# Patient Record
Sex: Female | Born: 1995 | Race: Black or African American | Hispanic: No | Marital: Single | Smoking: Never smoker
Health system: Southern US, Community
[De-identification: ages and names within clinical notes are randomized; demographics above are authoritative.]

## PROBLEM LIST (undated history)

## (undated) DIAGNOSIS — F329 Major depressive disorder, single episode, unspecified: Secondary | ICD-10-CM

## (undated) DIAGNOSIS — M549 Dorsalgia, unspecified: Secondary | ICD-10-CM

## (undated) DIAGNOSIS — F32A Depression, unspecified: Secondary | ICD-10-CM

## (undated) DIAGNOSIS — E669 Obesity, unspecified: Secondary | ICD-10-CM

## (undated) DIAGNOSIS — L732 Hidradenitis suppurativa: Secondary | ICD-10-CM

## (undated) DIAGNOSIS — G8929 Other chronic pain: Secondary | ICD-10-CM

## (undated) HISTORY — DX: Obesity, unspecified: E66.9

## (undated) HISTORY — DX: Depression, unspecified: F32.A

---
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
## 2015-01-27 ENCOUNTER — Emergency Department (INDEPENDENT_AMBULATORY_CARE_PROVIDER_SITE_OTHER)
Admission: EM | Admit: 2015-01-27 | Discharge: 2015-01-27 | Disposition: A | Discharge status: Home / Self Care | Source: Home / Self Care | Attending: Family Medicine | Admitting: Family Medicine

## 2015-01-27 ENCOUNTER — Encounter (HOSPITAL_COMMUNITY): Payer: Self-pay | Admitting: Emergency Medicine

## 2015-01-27 DIAGNOSIS — M940 Chondrocostal junction syndrome [Tietze]: Secondary | ICD-10-CM | POA: Diagnosis not present

## 2015-01-27 MED ORDER — MELOXICAM 7.5 MG PO TABS: 7.5000 mg | ORAL_TABLET | Freq: Two times a day (BID) | ORAL | Status: DC

## 2015-01-27 NOTE — ED Provider Notes (Signed)
CSN: [id number]     Arrival date & time 01/27/15  1600 History   First MD Initiated Contact with Patient 01/27/15 1726     Chief Complaint  Patient presents with  . Chest Pain   (Consider location/radiation/quality/duration/timing/severity/associated sxs/prior Treatment) Patient is a 19 y.o. female presenting with chest pain. The history is provided by the patient.  Chest Pain Pain location:  L chest and R chest Pain quality: sharp   Pain radiates to:  Does not radiate Pain radiates to the back: no   Pain severity:  Mild Onset quality:  Gradual Duration:  2 days Progression:  Unchanged Chronicity:  New Relieved by:  None tried Worsened by:  Nothing tried Ineffective treatments:  None tried Associated symptoms: no cough, no fever, no palpitations and no shortness of breath     Past Medical History  Diagnosis Date  . Obesity    History reviewed. No pertinent past surgical history. Family History  Problem Relation Age of Onset  . Hypertension Mother 1  . Anxiety disorder Mother   . Hyperlipidemia Father 19  . Stroke Maternal Grandmother   . Anxiety disorder Sister    History  Substance Use Topics  . Smoking status: Never Smoker   . Smokeless tobacco: Never Used  . Alcohol Use: No   OB History    Gravida Para Term Preterm AB TAB SAB Ectopic Multiple Living   Review of Systems  Constitutional: Negative.  Negative for fever.  Respiratory: Negative.  Negative for cough and shortness of breath.   Cardiovascular: Positive for chest pain. Negative for palpitations and leg swelling.    Allergies  Review of patient's allergies indicates no known allergies.  Home Medications   Prior to Admission medications   Medication Sig Start Date End Date Taking? Authorizing Provider  albuterol (PROVENTIL HFA;VENTOLIN HFA) 108 (90 BASE) MCG/ACT inhaler Inhale 2 puffs into the lungs every 6 (six) hours as needed for wheezing or shortness of breath. 12/23/14   Rodolph Bong, MD  azithromycin (ZITHROMAX) 250 MG tablet Take 1 tablet (250 mg total) by mouth daily. Take first 2 tablets together, then 1 every day until finished. 12/23/14   Rodolph Bong, MD  clindamycin (CLEOCIN) 300 MG capsule Take 300 mg by mouth 3 (three) times daily. For 10 days. Started 08/16/13    Historical Provider, MD  guaiFENesin-codeine 100-10 MG/5ML syrup Take 5 mLs by mouth at bedtime as needed for cough. 12/21/14   Rodolph Bong, MD  ibuprofen (ADVIL,MOTRIN) 800 MG tablet Take 800 mg by mouth every 8 (eight) hours as needed.    Historical Provider, MD  ipratropium (ATROVENT) 0.06 % nasal spray Place 2 sprays into both nostrils 4 (four) times daily. 12/21/14   Rodolph Bong, MD  meloxicam (MOBIC) 7.5 MG tablet Take 1 tablet (7.5 mg total) by mouth 2 (two) times daily after a meal. 01/27/15   Linna Hoff, MD  naproxen (NAPROSYN) 375 MG tablet Take 1 tablet (375 mg total) by mouth 2 (two) times daily. 12/21/14   Rodolph Bong, MD  norethindrone-ethinyl estradiol (OVCON-35, 28,) 0.4-35 MG-MCG tablet Take 1 tablet by mouth daily. 10/05/13   Antionette Char, MD  predniSONE (DELTASONE) 10 MG tablet Take 3 tablets (30 mg total) by mouth daily. Patient not taking: Reported on 01/27/2015 12/23/14   Rodolph Bong, MD   BP 98/67 mmHg  Pulse 89  Temp(Src) 99.1 F (  37.3 C) (Oral)  Resp 16  SpO2 99%  LMP 12/28/2014 Physical Exam  Constitutional: She is oriented to person, place, and time. She appears well-developed and well-nourished. No distress.  Cardiovascular: Normal rate, regular rhythm, normal heart sounds and intact distal pulses.   Pulmonary/Chest: Effort normal and breath sounds normal. She exhibits tenderness.  Musculoskeletal: She exhibits no edema.  Neurological: She is alert and oriented to person, place, and time.  Skin: Skin is warm.  Nursing note and vitals reviewed.   ED Course  Procedures (including critical care time) Labs Review Labs Reviewed - No data to display  Imaging  Review No results found.   MDM   1. Costochondritis, acute        Linna HoffJames D Kindl, MD 01/27/15 1755

## 2015-01-27 NOTE — ED Notes (Signed)
C/o center chest pain with deep breathing.  Patient reports pain for 2-3 days.  Reports breathing is what seems to aggravate pain.

## 2015-03-23 ENCOUNTER — Encounter (HOSPITAL_COMMUNITY): Payer: Self-pay | Admitting: *Deleted

## 2015-03-23 ENCOUNTER — Emergency Department (HOSPITAL_COMMUNITY)
Admission: EM | Admit: 2015-03-23 | Discharge: 2015-03-23 | Disposition: A | Discharge status: Home / Self Care | Attending: Emergency Medicine | Admitting: Emergency Medicine

## 2015-03-23 DIAGNOSIS — R220 Localized swelling, mass and lump, head: Secondary | ICD-10-CM | POA: Diagnosis present

## 2015-03-23 DIAGNOSIS — E669 Obesity, unspecified: Secondary | ICD-10-CM | POA: Diagnosis not present

## 2015-03-23 DIAGNOSIS — J029 Acute pharyngitis, unspecified: Secondary | ICD-10-CM | POA: Insufficient documentation

## 2015-03-23 DIAGNOSIS — Z79899 Other long term (current) drug therapy: Secondary | ICD-10-CM | POA: Diagnosis not present

## 2015-03-23 MED ORDER — DEXAMETHASONE SODIUM PHOSPHATE 4 MG/ML IJ SOLN
10.0000 mg | Freq: Once | INTRAMUSCULAR | Status: AC
  Administered 2015-03-23: 10 mg via INTRAVENOUS
  Filled 2015-03-23: qty 3

## 2015-03-23 MED ORDER — HYDROCODONE-HOMATROPINE 5-1.5 MG/5ML PO SYRP
5.0000 mL | ORAL_SOLUTION | Freq: Once | ORAL | Status: AC
  Administered 2015-03-23: 5 mL via ORAL
  Filled 2015-03-23: qty 5

## 2015-03-23 MED ORDER — PREDNISONE 10 MG PO TABS: 20.0000 mg | ORAL_TABLET | Freq: Two times a day (BID) | ORAL | Status: DC

## 2015-03-23 MED ORDER — LORATADINE 10 MG PO TABS
10.0000 mg | ORAL_TABLET | Freq: Once | ORAL | Status: AC
  Administered 2015-03-23: 10 mg via ORAL
  Filled 2015-03-23: qty 1

## 2015-03-23 MED ORDER — NAPROXEN 500 MG PO TABS: 500.0000 mg | ORAL_TABLET | Freq: Two times a day (BID) | ORAL | Status: DC

## 2015-03-23 MED ORDER — LORATADINE 10 MG PO TABS: 10.0000 mg | ORAL_TABLET | Freq: Every day | ORAL | Status: DC

## 2015-03-23 NOTE — ED Provider Notes (Signed)
CSN: [id number]     Arrival date & time 03/23/15  0000 History   None    Chief Complaint  Patient presents with  . Oral Swelling     (Consider location/radiation/quality/duration/timing/severity/associated sxs/prior Treatment) The history is provided by the patient.   Kelsey Carroll is a 19 y.o. female who presents to the ED with oral swelling that started earlier today. She states that when she got up this am she felt like her cheeks were swollen on the inside of her mouth and her throat felt swollen. She denies difficulty swallowing, fever, chills or other problems. She has taken nothing for pain. She denies dental problems.   Past Medical History  Diagnosis Date  . Obesity    History reviewed. No pertinent past surgical history. Family History  Problem Relation Age of Onset  . Hypertension Mother 37  . Anxiety disorder Mother   . Hyperlipidemia Father 56  . Stroke Maternal Grandmother   . Anxiety disorder Sister    Social History  Substance Use Topics  . Smoking status: Never Smoker   . Smokeless tobacco: Never Used  . Alcohol Use: No   OB History    Gravida Para Term Preterm AB TAB SAB Ectopic Multiple Living   1    1 1          Review of Systems Negative except as stated in HPI   Allergies  Review of patient's allergies indicates no known allergies.  Home Medications   Prior to Admission medications   Medication Sig Start Date End Date Taking? Authorizing Provider  albuterol (PROVENTIL HFA;VENTOLIN HFA) 108 (90 BASE) MCG/ACT inhaler Inhale 2 puffs into the lungs every 6 (six) hours as needed for wheezing or shortness of breath. 12/23/14   Rodolph Bong, MD  ipratropium (ATROVENT) 0.06 % nasal spray Place 2 sprays into both nostrils 4 (four) times daily. 12/21/14   Rodolph Bong, MD  loratadine (CLARITIN) 10 MG tablet Take 1 tablet (10 mg total) by mouth daily. 03/23/15   Lenda Baratta Orlene Och, NP  naproxen (NAPROSYN) 500 MG tablet Take 1 tablet (500 mg total) by mouth 2  (two) times daily. 03/23/15   Mikaila Grunert Orlene Och, NP  norethindrone-ethinyl estradiol (OVCON-35, 28,) 0.4-35 MG-MCG tablet Take 1 tablet by mouth daily. 10/05/13   Antionette Char, MD   BP 105/66 mmHg  Pulse 93  Temp(Src) 98.3 F (36.8 C) (Oral)  Resp 22  Ht 5\' 6"  (1.676 m)  SpO2 98%  LMP 03/19/2015 Physical Exam  Constitutional: She is oriented to person, place, and time. She appears well-developed and well-nourished.  HENT:  Head: Normocephalic and atraumatic.  Right Ear: Tympanic membrane normal.  Left Ear: Tympanic membrane normal.  Nose: Nose normal.  Mouth/Throat: Uvula is midline, oropharynx is clear and moist and mucous membranes are normal. No oral lesions. No trismus in the jaw. Normal dentition. No dental abscesses, uvula swelling or dental caries. No oropharyngeal exudate, posterior oropharyngeal edema, posterior oropharyngeal erythema or tonsillar abscesses.  Eyes: Conjunctivae and EOM are normal.  Neck: Normal range of motion. Neck supple. No tracheal deviation present.  Cardiovascular: Normal rate and regular rhythm.   Pulmonary/Chest: Effort normal and breath sounds normal.  Musculoskeletal: Normal range of motion.  Lymphadenopathy:    She has no cervical adenopathy.  Neurological: She is alert and oriented to person, place, and time. No cranial nerve deficit.  Skin: Skin is warm and dry.  Psychiatric: She has a normal mood and affect. Her behavior is normal.  Nursing note and vitals reviewed.   ED Course  Procedures (including critical care time)  Dr. Judd Lien in to examine the patient and does not feel that imaging is indicated. Will treat for pain and offer patient PO fluids.  Decadron 10 mg IM, Claritin 10 mg PO, Hycodan 5 ccs po   MDM  19 y.o. female with feeling of oral swelling x 1 day. Stable for d/c eating and drinking without difficulty and states that pain is almost gone. Discussed with the patient plan of care and all questioned fully answered. She will  return if any problems arise. Rx for prednisone, Claritin and Naprosyn.  Final diagnoses:  Sore throat       Janne Napoleon, NP 03/23/15 1610  Geoffery Lyons, MD 03/23/15 [phone]

## 2015-03-23 NOTE — ED Notes (Signed)
The pt has had mouth swelling just today and it makes her throat hurt.noi breathing difficulty  lmp  Just off

## 2015-03-23 NOTE — Discharge Instructions (Signed)
You may take Benadryl in addition to the other medications if needed. Return for worsening symptoms.

## 2015-05-21 ENCOUNTER — Encounter (HOSPITAL_COMMUNITY): Payer: Self-pay | Admitting: Family Medicine

## 2015-05-21 ENCOUNTER — Emergency Department (HOSPITAL_COMMUNITY)
Admission: EM | Admit: 2015-05-21 | Discharge: 2015-05-21 | Disposition: A | Discharge status: Home / Self Care | Attending: Physician Assistant | Admitting: Physician Assistant

## 2015-05-21 DIAGNOSIS — E669 Obesity, unspecified: Secondary | ICD-10-CM | POA: Diagnosis not present

## 2015-05-21 DIAGNOSIS — Z793 Long term (current) use of hormonal contraceptives: Secondary | ICD-10-CM | POA: Insufficient documentation

## 2015-05-21 DIAGNOSIS — N762 Acute vulvitis: Secondary | ICD-10-CM | POA: Insufficient documentation

## 2015-05-21 DIAGNOSIS — N764 Abscess of vulva: Secondary | ICD-10-CM | POA: Diagnosis present

## 2015-05-21 DIAGNOSIS — Z3202 Encounter for pregnancy test, result negative: Secondary | ICD-10-CM | POA: Diagnosis not present

## 2015-05-21 DIAGNOSIS — Z79899 Other long term (current) drug therapy: Secondary | ICD-10-CM | POA: Diagnosis not present

## 2015-05-21 DIAGNOSIS — L0390 Cellulitis, unspecified: Secondary | ICD-10-CM

## 2015-05-21 DIAGNOSIS — Z791 Long term (current) use of non-steroidal anti-inflammatories (NSAID): Secondary | ICD-10-CM | POA: Diagnosis not present

## 2015-05-21 DIAGNOSIS — A5901 Trichomonal vulvovaginitis: Secondary | ICD-10-CM | POA: Insufficient documentation

## 2015-05-21 DIAGNOSIS — Z7952 Long term (current) use of systemic steroids: Secondary | ICD-10-CM | POA: Diagnosis not present

## 2015-05-21 DIAGNOSIS — A599 Trichomoniasis, unspecified: Secondary | ICD-10-CM

## 2015-05-21 LAB — WET PREP, GENITAL: Yeast Wet Prep HPF POC: NONE SEEN

## 2015-05-21 LAB — POC URINE PREG, ED: PREG TEST UR: NEGATIVE

## 2015-05-21 MED ORDER — CEPHALEXIN 500 MG PO CAPS: 500.0000 mg | ORAL_CAPSULE | Freq: Four times a day (QID) | ORAL | Status: DC

## 2015-05-21 MED ORDER — ACETAMINOPHEN 500 MG PO TABS: 500.0000 mg | ORAL_TABLET | Freq: Four times a day (QID) | ORAL | Status: DC | PRN

## 2015-05-21 MED ORDER — CEFTRIAXONE SODIUM 250 MG IJ SOLR
250.0000 mg | Freq: Once | INTRAMUSCULAR | Status: AC
  Administered 2015-05-21: 250 mg via INTRAMUSCULAR
  Filled 2015-05-21: qty 250

## 2015-05-21 MED ORDER — METRONIDAZOLE 500 MG PO TABS
2000.0000 mg | ORAL_TABLET | Freq: Once | ORAL | Status: AC
  Administered 2015-05-21: 2000 mg via ORAL
  Filled 2015-05-21: qty 4

## 2015-05-21 MED ORDER — LIDOCAINE HCL (PF) 1 % IJ SOLN: 5.0000 mL | Freq: Once | INTRAMUSCULAR | Status: DC

## 2015-05-21 MED ORDER — AZITHROMYCIN 250 MG PO TABS
1000.0000 mg | ORAL_TABLET | Freq: Once | ORAL | Status: AC
  Administered 2015-05-21: 1000 mg via ORAL
  Filled 2015-05-21: qty 4

## 2015-05-21 MED ORDER — ACETAMINOPHEN 325 MG PO TABS
650.0000 mg | ORAL_TABLET | Freq: Once | ORAL | Status: AC
  Administered 2015-05-21: 650 mg via ORAL
  Filled 2015-05-21: qty 2

## 2015-05-21 NOTE — Discharge Instructions (Signed)
Cellulitis Cellulitis is an infection of the skin and the tissue beneath it. The infected area is usually red and tender. Cellulitis occurs most often in the arms and lower legs.  CAUSES  Cellulitis is caused by bacteria that enter the skin through cracks or cuts in the skin. The most common types of bacteria that cause cellulitis are staphylococci and streptococci. SIGNS AND SYMPTOMS   Redness and warmth.  Swelling.  Tenderness or pain.  Fever. DIAGNOSIS  Your health care provider can usually determine what is wrong based on a physical exam. Blood tests may also be done. TREATMENT  Treatment usually involves taking an antibiotic medicine. HOME CARE INSTRUCTIONS   Take your antibiotic medicine as directed by your health care provider. Finish the antibiotic even if you start to feel better.  Keep the infected arm or leg elevated to reduce swelling.  Apply a warm cloth to the affected area up to 4 times per day to relieve pain.  Take medicines only as directed by your health care provider.  Keep all follow-up visits as directed by your health care provider. SEEK MEDICAL CARE IF:   You notice red streaks coming from the infected area.  Your red area gets larger or turns dark in color.  Your bone or joint underneath the infected area becomes painful after the skin has healed.  Your infection returns in the same area or another area.  You notice a swollen bump in the infected area.  You develop new symptoms.  You have a fever. SEEK IMMEDIATE MEDICAL CARE IF:   You feel very sleepy.  You develop vomiting or diarrhea.  You have a general ill feeling (malaise) with muscle aches and pains.   This information is not intended to replace advice given to you by your health care provider. Make sure you discuss any questions you have with your health care provider.   Document Released: 04/07/2005 Document Revised: 03/19/2015 Document Reviewed: 09/13/2011 Elsevier Interactive  Patient Education 2016 [employer].  Sexually Transmitted Disease A sexually transmitted disease (STD) is a disease or infection that may be passed (transmitted) from person to person, usually during sexual activity. This may happen by way of saliva, semen, blood, vaginal mucus, or urine. Common STDs include:  Gonorrhea.  Chlamydia.  Syphilis.  HIV and AIDS.  Genital herpes.  Hepatitis B and C.  Trichomonas.  Human papillomavirus (HPV).  Pubic lice.  Scabies.  Mites.  Bacterial vaginosis. WHAT ARE CAUSES OF STDs? An STD may be caused by bacteria, a virus, or parasites. STDs are often transmitted during sexual activity if one person is infected. However, they may also be transmitted through nonsexual means. STDs may be transmitted after:   Sexual intercourse with an infected person.  Sharing sex toys with an infected person.  Sharing needles with an infected person or using unclean piercing or tattoo needles.  Having intimate contact with the genitals, mouth, or rectal areas of an infected person.  Exposure to infected fluids during birth. WHAT ARE THE SIGNS AND SYMPTOMS OF STDs? Different STDs have different symptoms. Some people may not have any symptoms. If symptoms are present, they may include:  Painful or bloody urination.  Pain in the pelvis, abdomen, vagina, anus, throat, or eyes.  A skin rash, itching, or irritation.  Growths, ulcerations, blisters, or sores in the genital and anal areas.  Abnormal vaginal discharge with or without bad odor.  Penile discharge in men.  Fever.  Pain or bleeding during sexual intercourse.  Swollen  glands in the groin area.  Yellow skin and eyes (jaundice). This is seen with hepatitis.  Swollen testicles.  Infertility.  Sores and blisters in the mouth. HOW ARE STDs DIAGNOSED? To make a diagnosis, your health care provider may:  Take a medical history.  Perform a physical exam.  Take a sample of any  discharge to examine.  Swab the throat, cervix, opening to the penis, rectum, or vagina for testing.  Test a sample of your first morning urine.  Perform blood tests.  Perform a Pap test, if this applies.  Perform a colposcopy.  Perform a laparoscopy. HOW ARE STDs TREATED? Treatment depends on the STD. Some STDs may be treated but not cured.  Chlamydia, gonorrhea, trichomonas, and syphilis can be cured with antibiotic medicine.  Genital herpes, hepatitis, and HIV can be treated, but not cured, with prescribed medicines. The medicines lessen symptoms.  Genital warts from HPV can be treated with medicine or by freezing, burning (electrocautery), or surgery. Warts may come back.  HPV cannot be cured with medicine or surgery. However, abnormal areas may be removed from the cervix, vagina, or vulva.  If your diagnosis is confirmed, your recent sexual partners need treatment. This is true even if they are symptom-free or have a negative culture or evaluation. They should not have sex until their health care providers say it is okay.  Your health care provider may test you for infection again 3 months after treatment. HOW CAN I REDUCE MY RISK OF GETTING AN STD? Take these steps to reduce your risk of getting an STD:  Use latex condoms, dental dams, and water-soluble lubricants during sexual activity. Do not use petroleum jelly or oils.  Avoid having multiple sex partners.  Do not have sex with someone who has other sex partners  Do not have sex with anyone you do not know or who is at high risk for an STD.  Avoid risky sex practices that can break your skin.  Do not have sex if you have open sores on your mouth or skin.  Avoid drinking too much alcohol or taking illegal drugs. Alcohol and drugs can affect your judgment and put you in a vulnerable position.  Avoid engaging in oral and anal sex acts.  Get vaccinated for HPV and hepatitis. If you have not received these vaccines  in the past, talk to your health care provider about whether one or both might be right for you.  If you are at risk of being infected with HIV, it is recommended that you take a prescription medicine daily to prevent HIV infection. This is called pre-exposure prophylaxis (PrEP). You are considered at risk if:  You are a man who has sex with other men (MSM).  You are a heterosexual man or woman and are sexually active with more than one partner.  You take drugs by injection.  You are sexually active with a partner who has HIV.  Talk with your health care provider about whether you are at high risk of being infected with HIV. If you choose to begin PrEP, you should first be tested for HIV. You should then be tested every 3 months for as long as you are taking PrEP. WHAT SHOULD I DO IF I THINK I HAVE AN STD?  See your health care provider.  Tell your sexual partner(s). They should be tested and treated for any STDs.  Do not have sex until your health care provider says it is okay. WHEN SHOULD I GET  IMMEDIATE MEDICAL CARE? Contact your health care provider right away if:   You have severe abdominal pain.  You are a man and notice swelling or pain in your testicles.  You are a woman and notice swelling or pain in your vagina.   This information is not intended to replace advice given to you by your health care provider. Make sure you discuss any questions you have with your health care provider.   Document Released: 09/18/2002 Document Revised: 07/19/2014 Document Reviewed: 01/16/2013 Elsevier Interactive Patient Education 2016 [employer]  Trichomoniasis Trichomoniasis is an infection caused by an organism called Trichomonas. The infection can affect both women and men. In women, the outer female genitalia and the vagina are affected. In men, the penis is mainly affected, but the prostate and other reproductive organs can also be involved. Trichomoniasis is a sexually transmitted  infection (STI) and is most often passed to another person through sexual contact.  RISK FACTORS  Having unprotected sexual intercourse.  Having sexual intercourse with an infected partner. SIGNS AND SYMPTOMS  Symptoms of trichomoniasis in women include:  Abnormal gray-green frothy vaginal discharge.  Itching and irritation of the vagina.  Itching and irritation of the area outside the vagina. Symptoms of trichomoniasis in men include:   Penile discharge with or without pain.  Pain during urination. This results from inflammation of the urethra. DIAGNOSIS  Trichomoniasis may be found during a Pap test or physical exam. Your health care provider may use one of the following methods to help diagnose this infection:  Testing the pH of the vagina with a test tape.  Using a vaginal swab test that checks for the Trichomonas organism. A test is available that provides results within a few minutes.  Examining a urine sample.  Testing vaginal secretions. Your health care provider may test you for other STIs, including HIV. TREATMENT   You may be given medicine to fight the infection. Women should inform their health care provider if they could be or are pregnant. Some medicines used to treat the infection should not be taken during pregnancy.  Your health care provider may recommend over-the-counter medicines or creams to decrease itching or irritation.  Your sexual partner will need to be treated if infected.  Your health care provider may test you for infection again 3 months after treatment. HOME CARE INSTRUCTIONS   Take medicines only as directed by your health care provider.  Take over-the-counter medicine for itching or irritation as directed by your health care provider.  Do not have sexual intercourse while you have the infection.  Women should not douche or wear tampons while they have the infection.  Discuss your infection with your partner. Your partner may have  gotten the infection from you, or you may have gotten it from your partner.  Have your sex partner get examined and treated if necessary.  Practice safe, informed, and protected sex.  See your health care provider for other STI testing. SEEK MEDICAL CARE IF:   You still have symptoms after you finish your medicine.  You develop abdominal pain.  You have pain when you urinate.  You have bleeding after sexual intercourse.  You develop a rash.  Your medicine makes you sick or makes you throw up (vomit). MAKE SURE YOU:  Understand these instructions.  Will watch your condition.  Will get help right away if you are not doing well or get worse.   This information is not intended to replace advice given to you by  your health care provider. Make sure you discuss any questions you have with your health care provider.   Document Released: 12/22/2000 Document Revised: 07/19/2014 Document Reviewed: 04/09/2013 Elsevier Interactive Patient Education 2016 [employer].  Safe Sex  Safe sex is about reducing the risk of giving or getting a sexually transmitted disease (STD). STDs are spread through sexual contact involving the genitals, mouth, or rectum. Some STDs can be cured and others cannot. Safe sex can also prevent unintended pregnancies.  WHAT ARE SOME SAFE SEX PRACTICES?  Limit your sexual activity to only one partner who is having sex with only you.  Talk to your partner about his or her past partners, past STDs, and drug use.  Use a condom every time you have sexual intercourse. This includes vaginal, oral, and anal sexual activity. Both females and males should wear condoms during oral sex. Only use latex or polyurethane condoms and water-based lubricants. Using petroleum-based lubricants or oils to lubricate a condom will weaken the condom and increase the chance that it will break. The condom should be in place from the beginning to the end of sexual activity. Wearing a condom  reduces, but does not completely eliminate, your risk of getting or giving an STD. STDs can be spread by contact with infected body fluids and skin.  Get vaccinated for hepatitis B and HPV.  Avoid alcohol and recreational drugs, which can affect your judgment. You may forget to use a condom or participate in high-risk sex.  For females, avoid douching after sexual intercourse. Douching can spread an infection farther into the reproductive tract.  Check your body for signs of sores, blisters, rashes, or unusual discharge. See your health care provider if you notice any of these signs.  Avoid sexual contact if you have symptoms of an infection or are being treated for an STD. If you or your partner has herpes, avoid sexual contact when blisters are present. Use condoms at all other times.  If you are at risk of being infected with HIV, it is recommended that you take a prescription medicine daily to prevent HIV infection. This is called pre-exposure prophylaxis (PrEP). You are considered at risk if:  You are a man who has sex with other men (MSM).  You are a heterosexual man or woman who is sexually active with more than one partner.  You take drugs by injection.  You are sexually active with a partner who has HIV. Talk with your health care provider about whether you are at high risk of being infected with HIV. If you choose to begin PrEP, you should first be tested for HIV. You should then be tested every 3 months for as long as you are taking PrEP.  See your health care provider for regular screenings, exams, and tests for other STDs. Before having sex with a new partner, each of you should be screened for STDs and should talk about the results with each other. WHAT ARE THE BENEFITS OF SAFE SEX?  There is less chance of getting or giving an STD.  You can prevent unwanted or unintended pregnancies.  By discussing safe sex concerns with your partner, you may increase feelings of intimacy, comfort,  trust, and honesty between the two of you. This information is not intended to replace advice given to you by your health care provider. Make sure you discuss any questions you have with your health care provider.  Document Released: 08/05/2004 Document Revised: 07/19/2014 Document Reviewed: 12/20/2011  Elsevier Interactive Patient Education  2016 [employer].

## 2015-05-21 NOTE — ED Provider Notes (Signed)
CSN: [id number]     Arrival date & time 05/21/15  1139 History  By signing my name below, I, Kelsey Carroll, attest that this documentation has been prepared under the direction and in the presence of [name], PA-C. Electronically Signed: Tanda Carroll, ED Scribe. 05/21/2015. 12:57 PM.  Chief Complaint  Patient presents with  . Abscess   The history is provided by the patient. No language interpreter was used.     HPI Comments: Kelsey Carroll is a 19 y.o. female who presents to the Emergency Department complaining of abscess to right labia x 2-3 days, increasing in size 1 day ago. Pt states that she recently shaved around the area and believes the abscess formed due to shaving. She reports gradual onset, constant, 5/10, pain to the area since onset. Pt soaked in epsom salts earlier today with mild relief. She notes that while in the waiting room today it began draining a foul smelling, bloody and white discharge. She also complains of vaginal discharge. Denies fever, chills, nausea, vomiting, abdominal pain, dysuria, or any other associated symptoms. Pt is requested a pregnancy test today as well.   Past Medical History  Diagnosis Date  . Obesity    History reviewed. No pertinent past surgical history. Family History  Problem Relation Age of Onset  . Hypertension Mother 24  . Anxiety disorder Mother   . Hyperlipidemia Father 72  . Stroke Maternal Grandmother   . Anxiety disorder Sister    Social History  Substance Use Topics  . Smoking status: Never Smoker   . Smokeless tobacco: Never Used  . Alcohol Use: No   OB History    Gravida Para Term Preterm AB TAB SAB Ectopic Multiple Living   Review of Systems  All other systems reviewed and are negative.   Allergies  Review of patient's allergies indicates no known allergies.  Home Medications   Prior to Admission medications   Medication Sig Start Date End Date Taking? Authorizing Provider   acetaminophen (TYLENOL) 500 MG tablet Take 1 tablet (500 mg total) by mouth every 6 (six) hours as needed. 05/21/15   [name], PA-C  albuterol (PROVENTIL HFA;VENTOLIN HFA) 108 (90 BASE) MCG/ACT inhaler Inhale 2 puffs into the lungs every 6 (six) hours as needed for wheezing or shortness of breath. 12/23/14   Rodolph Bong, MD  cephALEXin (KEFLEX) 500 MG capsule Take 1 capsule (500 mg total) by mouth 4 (four) times daily. 05/21/15   Keevon Henney, PA-C  ipratropium (ATROVENT) 0.06 % nasal spray Place 2 sprays into both nostrils 4 (four) times daily. 12/21/14   Rodolph Bong, MD  loratadine (CLARITIN) 10 MG tablet Take 1 tablet (10 mg total) by mouth daily. 03/23/15   Hope Orlene Och, NP  naproxen (NAPROSYN) 500 MG tablet Take 1 tablet (500 mg total) by mouth 2 (two) times daily. 03/23/15   Hope Orlene Och, NP  norethindrone-ethinyl estradiol (OVCON-35, 28,) 0.4-35 MG-MCG tablet Take 1 tablet by mouth daily. 10/05/13   Antionette Char, MD  predniSONE (DELTASONE) 10 MG tablet Take 2 tablets (20 mg total) by mouth 2 (two) times daily with a meal. 03/23/15   Hope Orlene Och, NP   Triage Vitals: BP 107/64 mmHg  Pulse 75  Temp(Src) 98.1 F (36.7 C) (Oral)  Resp 18  SpO2 97%  LMP 05/19/2015   Physical Exam  Constitutional: She is oriented to person, place, and time. She  appears well-developed and well-nourished. No distress.  HENT:  Head: Normocephalic and atraumatic.  Eyes: Conjunctivae and EOM are normal.  Neck: Neck supple. No tracheal deviation present.  Cardiovascular: Normal rate, regular rhythm and normal heart sounds.   Pulmonary/Chest: Effort normal and breath sounds normal. No respiratory distress.  Abdominal: Soft. Bowel sounds are normal. She exhibits no distension. There is no tenderness.  Genitourinary: Uterus normal.    There is no tenderness on the right labia. There is no tenderness on the left labia. Cervix exhibits discharge. Cervix exhibits no motion tenderness. Right adnexum displays no  tenderness. Left adnexum displays no tenderness. Vaginal discharge found.  Musculoskeletal: Normal range of motion.  Neurological: She is alert and oriented to person, place, and time.  Skin: Skin is warm and dry.  Psychiatric: She has a normal mood and affect. Her behavior is normal.  Nursing note and vitals reviewed.   ED Course  Procedures (including critical care time)  DIAGNOSTIC STUDIES: Oxygen Saturation is 97% on RA, normal by my interpretation.    COORDINATION OF CARE: 12:55 PM-Discussed treatment plan which includes I&D, pelvic exam, and urine pregnancy with pt at bedside and pt agreed to plan.   Labs Review Labs Reviewed  WET PREP, GENITAL - Abnormal; Notable for the following:    Trich, Wet Prep FEW (*)    Clue Cells Wet Prep HPF POC MODERATE (*)    WBC, Wet Prep HPF POC FEW (*)    All other components within normal limits  POC URINE PREG, ED  GC/CHLAMYDIA PROBE AMP [facility]    Imaging Review No results found. I have personally reviewed and evaluated these lab results as part of my medical decision-making.   EKG Interpretation None      MDM   Final diagnoses:  Trichomoniasis  Cellulitis, unspecified cellulitis site, unspecified extremity site, unspecified laterality    Patient presents with abscess and abnormal vaginal discharge.  Patient has hx of abscess.  On exam, heart RRR, lungs CTAB, abdomen soft and benign.  Evidence area  lateral to right labia with erythema and induration.  Patient reports it drained upon arrival to ED.  I do not appreciate a well defined area of fluctuance that would indicate drainage.  I suspect spontaneous drainage.  Discussed risks and benefits of I&D.  Patient refused procedure.  Will give keflex and warm compresses to the area.  Discussed return precautions and will d/c home with keflex.  Wet prep shows evidence of trich.  Will treat here with metronidazole.  Will also empirically treat for  GC/chlamydia.  Evaluation does not show pathology requring ongoing emergent intervention or admission. Pt is hemodynamically stable and mentating appropriately. Discussed findings/results and plan with patient/guardian, who agrees with plan. All questions answered. Return precautions discussed and outpatient follow up given.   I personally performed the services described in this documentation, which was scribed in my presence. The recorded information has been reviewed and is accurate.      Cheri FowlerKayla Verner Kopischke, PA-C 05/21/15 1444  Courteney Lyn Mackuen, MD 05/21/15 1622

## 2015-05-21 NOTE — ED Notes (Signed)
Pt has hx of multiple abscesses in past. Today has lesion on right side of right labia. Reportedly, it drained while sitting in lobby, foul smelling discharge.

## 2015-05-21 NOTE — ED Notes (Signed)
Suture cart placed on outside of room

## 2015-05-21 NOTE — ED Notes (Signed)
Patient getting undressed and into a gown at this time; visitor at bedside 

## 2015-05-21 NOTE — ED Notes (Signed)
Pt here for abscess to inner thigh area. sts just started draining in waiting room. Foul smell. sts pain decreased since open.

## 2015-05-21 NOTE — ED Notes (Signed)
Blanket given; pelvic cart setup at bedside along with suture cart; visitor at bedside; [place] aware

## 2015-05-22 LAB — GC/CHLAMYDIA PROBE AMP (~~LOC~~) NOT AT ARMC
CHLAMYDIA, DNA PROBE: POSITIVE — AB
Neisseria Gonorrhea: NEGATIVE

## 2015-05-23 ENCOUNTER — Telehealth (HOSPITAL_BASED_OUTPATIENT_CLINIC_OR_DEPARTMENT_OTHER): Payer: Self-pay | Admitting: Emergency Medicine

## 2015-06-09 ENCOUNTER — Emergency Department (INDEPENDENT_AMBULATORY_CARE_PROVIDER_SITE_OTHER)
Admission: EM | Admit: 2015-06-09 | Discharge: 2015-06-09 | Disposition: A | Discharge status: Home / Self Care | Source: Home / Self Care | Attending: Family Medicine | Admitting: Family Medicine

## 2015-06-09 ENCOUNTER — Encounter (HOSPITAL_COMMUNITY): Payer: Self-pay | Admitting: Emergency Medicine

## 2015-06-09 DIAGNOSIS — J029 Acute pharyngitis, unspecified: Secondary | ICD-10-CM

## 2015-06-09 DIAGNOSIS — J069 Acute upper respiratory infection, unspecified: Secondary | ICD-10-CM

## 2015-06-09 DIAGNOSIS — J3489 Other specified disorders of nose and nasal sinuses: Secondary | ICD-10-CM | POA: Diagnosis not present

## 2015-06-09 LAB — POCT RAPID STREP A: Streptococcus, Group A Screen (Direct): NEGATIVE

## 2015-06-09 NOTE — ED Notes (Signed)
Also reports she's on Keflex for an abscess

## 2015-06-09 NOTE — ED Provider Notes (Signed)
CSN: [id number]     Arrival date & time 06/09/15  1738 History   First MD Initiated Contact with Patient 06/09/15 1855     Chief Complaint  Patient presents with  . URI   (Consider location/radiation/quality/duration/timing/severity/associated sxs/prior Treatment) HPI Comments: 19 year old female complaining of a sore throat starting yesterday and body aches started today. Denies cough, earache or PND.   Past Medical History  Diagnosis Date  . Obesity    History reviewed. No pertinent past surgical history. Family History  Problem Relation Age of Onset  . Hypertension Mother 6948  . Anxiety disorder Mother   . Hyperlipidemia Father [id number]  . Stroke Maternal Grandmother   . Anxiety disorder Sister    Social History  Substance Use Topics  . Smoking status: Never Smoker   . Smokeless tobacco: Never Used  . Alcohol Use: No   OB History    Gravida Para Term Preterm AB TAB SAB Ectopic Multiple Living   1    1 1          Review of Systems  Constitutional: Positive for activity change. Negative for fever, chills, appetite change and fatigue.  HENT: Positive for congestion, rhinorrhea, sore throat and voice change. Negative for facial swelling, postnasal drip and trouble swallowing.   Eyes: Negative.   Respiratory: Negative.  Negative for cough and shortness of breath.   Cardiovascular: Negative.   Gastrointestinal: Negative.   Genitourinary: Negative.   Musculoskeletal: Positive for myalgias. Negative for neck pain and neck stiffness.  Skin: Negative for pallor and rash.  Neurological: Negative.     Allergies  Review of patient's allergies indicates no known allergies.  Home Medications   Prior to Admission medications   Medication Sig Start Date End Date Taking? Authorizing Provider  cephALEXin (KEFLEX) 500 MG capsule Take 1 capsule (500 mg total) by mouth 4 (four) times daily. 05/21/15  Yes Cheri FowlerKayla Rose, PA-C  acetaminophen (TYLENOL) 500 MG tablet Take 1 tablet (500 mg total)  by mouth every 6 (six) hours as needed. 05/21/15   Cheri FowlerKayla Rose, PA-C  albuterol (PROVENTIL HFA;VENTOLIN HFA) 108 (90 BASE) MCG/ACT inhaler Inhale 2 puffs into the lungs every 6 (six) hours as needed for wheezing or shortness of breath. 12/23/14   Rodolph BongEvan S Corey, MD  ipratropium (ATROVENT) 0.06 % nasal spray Place 2 sprays into both nostrils 4 (four) times daily. 12/21/14   Rodolph BongEvan S Corey, MD  loratadine (CLARITIN) 10 MG tablet Take 1 tablet (10 mg total) by mouth daily. 03/23/15   Hope Orlene OchM Neese, NP  naproxen (NAPROSYN) 500 MG tablet Take 1 tablet (500 mg total) by mouth 2 (two) times daily. 03/23/15   Hope Orlene OchM Neese, NP  norethindrone-ethinyl estradiol (OVCON-35, 28,) 0.4-35 MG-MCG tablet Take 1 tablet by mouth daily. 10/05/13   Antionette CharLisa Jackson-Moore, MD  predniSONE (DELTASONE) 10 MG tablet Take 2 tablets (20 mg total) by mouth 2 (two) times daily with a meal. 03/23/15   Hope Orlene OchM Neese, NP   Meds Ordered and Administered this Visit  Medications - No data to display  BP 97/66 mmHg  Pulse 70  Temp(Src) 98.5 F (36.9 C) (Oral)  Resp 16  SpO2 99%  LMP 05/19/2015 No data found.   Physical Exam  Constitutional: She is oriented to person, place, and time. She appears well-developed and well-nourished. No distress.  HENT:  Mouth/Throat: No oropharyngeal exudate.  Bilateral TMs are normal Oropharynx with minor erythema, much cobblestoning and clear PND  Eyes: Conjunctivae and EOM are normal.  Neck: Normal  range of motion. Neck supple.  Cardiovascular: Normal rate, regular rhythm and normal heart sounds.   Pulmonary/Chest: Effort normal and breath sounds normal. No respiratory distress. She has no wheezes. She has no rales.  Musculoskeletal: Normal range of motion. She exhibits no edema.  Lymphadenopathy:    She has no cervical adenopathy.  Neurological: She is alert and oriented to person, place, and time.  Skin: Skin is warm and dry. No rash noted.  Psychiatric: She has a normal mood and affect.  Nursing  note and vitals reviewed.   ED Course  Procedures (including critical care time)  Labs Review Labs Reviewed  POCT RAPID STREP A   Results for orders placed or performed during the hospital encounter of 06/09/15  POCT rapid strep A [facility])  Result Value Ref Range   Streptococcus, Group A Screen (Direct) NEGATIVE NEGATIVE     Imaging Review No results found.   Visual Acuity Review  Right Eye Distance:   Left Eye Distance:   Bilateral Distance:    Right Eye Near:   Left Eye Near:    Bilateral Near:         MDM   1. URI (upper respiratory infection)   2. Pharyngitis   3. Sinus drainage    Sore Throat Motrin for pain Cepacol lozenges for throat pain, and aching Lots of fluids    Hayden Rasmussen, NP 06/09/15 1907

## 2015-06-09 NOTE — ED Notes (Signed)
C/o ST onset yest associated w/BA Denies fevers, chills A&O x4... No acute distress Blanket given to pt.

## 2015-06-09 NOTE — Discharge Instructions (Signed)
Sore Throat Motrin for pain Cepacol lozenges for throat pain, and aching A sore throat is pain, burning, irritation, or scratchiness of the throat. There is often pain or tenderness when swallowing or talking. A sore throat may be accompanied by other symptoms, such as coughing, sneezing, fever, and swollen neck glands. A sore throat is often the first sign of another sickness, such as a cold, flu, strep throat, or mononucleosis (commonly known as mono). Most sore throats go away without medical treatment. CAUSES  The most common causes of a sore throat include:  A viral infection, such as a cold, flu, or mono.  A bacterial infection, such as strep throat, tonsillitis, or whooping cough.  Seasonal allergies.  Dryness in the air.  Irritants, such as smoke or pollution.  Gastroesophageal reflux disease (GERD). HOME CARE INSTRUCTIONS   Only take over-the-counter medicines as directed by your caregiver.  Drink enough fluids to keep your urine clear or pale yellow.  Rest as needed.  Try using throat sprays, lozenges, or sucking on hard candy to ease any pain (if older than 4 years or as directed).  Sip warm liquids, such as broth, herbal tea, or warm water with honey to relieve pain temporarily. You may also eat or drink cold or frozen liquids such as frozen ice pops.  Gargle with salt water (mix 1 tsp salt with 8 oz of water).  Do not smoke and avoid secondhand smoke.  Put a cool-mist humidifier in your bedroom at night to moisten the air. You can also turn on a hot shower and sit in the bathroom with the door closed for 5-10 minutes. SEEK IMMEDIATE MEDICAL CARE IF:  You have difficulty breathing.  You are unable to swallow fluids, soft foods, or your saliva.  You have increased swelling in the throat.  Your sore throat does not get better in 7 days.  You have nausea and vomiting.  You have a fever or persistent symptoms for more than 2-3 days.  You have a fever and your  symptoms suddenly get worse. MAKE SURE YOU:   Understand these instructions.  Will watch your condition.  Will get help right away if you are not doing well or get worse.   This information is not intended to replace advice given to you by your health care provider. Make sure you discuss any questions you have with your health care provider.   Document Released: 08/05/2004 Document Revised: 07/19/2014 Document Reviewed: 03/05/2012 Elsevier Interactive Patient Education 2016 [employer]  Upper Respiratory Infection, Adult Most upper respiratory infections (URIs) are a viral infection of the air passages leading to the lungs. A URI affects the nose, throat, and upper air passages. The most common type of URI is nasopharyngitis and is typically referred to as "the common cold." URIs run their course and usually go away on their own. Most of the time, a URI does not require medical attention, but sometimes a bacterial infection in the upper airways can follow a viral infection. This is called a secondary infection. Sinus and middle ear infections are common types of secondary upper respiratory infections. Bacterial pneumonia can also complicate a URI. A URI can worsen asthma and chronic obstructive pulmonary disease (COPD). Sometimes, these complications can require emergency medical care and may be life threatening.  CAUSES Almost all URIs are caused by viruses. A virus is a type of germ and can spread from one person to another.  RISKS FACTORS You may be at risk for a URI if:  You smoke.   You have chronic heart or lung disease.  You have a weakened defense (immune) system.   You are very young or very old.   You have nasal allergies or asthma.  You work in crowded or poorly ventilated areas.  You work in health care facilities or schools. SIGNS AND SYMPTOMS  Symptoms typically develop 2-3 days after you come in contact with a cold virus. Most viral URIs last 7-10 days.  However, viral URIs from the influenza virus (flu virus) can last 14-18 days and are typically more severe. Symptoms may include:   Runny or stuffy (congested) nose.   Sneezing.   Cough.   Sore throat.   Headache.   Fatigue.   Fever.   Loss of appetite.   Pain in your forehead, behind your eyes, and over your cheekbones (sinus pain).  Muscle aches.  DIAGNOSIS  Your health care provider may diagnose a URI by:  Physical exam.  Tests to check that your symptoms are not due to another condition such as:  Strep throat.  Sinusitis.  Pneumonia.  Asthma. TREATMENT  A URI goes away on its own with time. It cannot be cured with medicines, but medicines may be prescribed or recommended to relieve symptoms. Medicines may help:  Reduce your fever.  Reduce your cough.  Relieve nasal congestion. HOME CARE INSTRUCTIONS   Take medicines only as directed by your health care provider.   Gargle warm saltwater or take cough drops to comfort your throat as directed by your health care provider.  Use a warm mist humidifier or inhale steam from a shower to increase air moisture. This may make it easier to breathe.  Drink enough fluid to keep your urine clear or pale yellow.   Eat soups and other clear broths and maintain good nutrition.   Rest as needed.   Return to work when your temperature has returned to normal or as your health care provider advises. You may need to stay home longer to avoid infecting others. You can also use a face mask and careful hand washing to prevent spread of the virus.  Increase the usage of your inhaler if you have asthma.   Do not use any tobacco products, including cigarettes, chewing tobacco, or electronic cigarettes. If you need help quitting, ask your health care provider. PREVENTION  The best way to protect yourself from getting a cold is to practice good hygiene.   Avoid oral or hand contact with people with cold symptoms.    Wash your hands often if contact occurs.  There is no clear evidence that vitamin C, vitamin E, echinacea, or exercise reduces the chance of developing a cold. However, it is always recommended to get plenty of rest, exercise, and practice good nutrition.  SEEK MEDICAL CARE IF:   You are getting worse rather than better.   Your symptoms are not controlled by medicine.   You have chills.  You have worsening shortness of breath.  You have brown or red mucus.  You have yellow or brown nasal discharge.  You have pain in your face, especially when you bend forward.  You have a fever.  You have swollen neck glands.  You have pain while swallowing.  You have white areas in the back of your throat. SEEK IMMEDIATE MEDICAL CARE IF:   You have severe or persistent:  Headache.  Ear pain.  Sinus pain.  Chest pain.  You have chronic lung disease and any of the following:  Wheezing.  Prolonged cough.  Coughing up blood.  A change in your usual mucus.  You have a stiff neck.  You have changes in your:  Vision.  Hearing.  Thinking.  Mood. MAKE SURE YOU:   Understand these instructions.  Will watch your condition.  Will get help right away if you are not doing well or get worse.   This information is not intended to replace advice given to you by your health care provider. Make sure you discuss any questions you have with your health care provider.   Document Released: 12/22/2000 Document Revised: 11/12/2014 Document Reviewed: 10/03/2013 Elsevier Interactive Patient Education [employer].

## 2015-06-11 LAB — CULTURE, GROUP A STREP: STREP A CULTURE: NEGATIVE

## 2015-06-12 NOTE — ED Notes (Signed)
Final report of culture negative

## 2015-08-20 ENCOUNTER — Ambulatory Visit: Admitting: Internal Medicine

## 2015-08-27 ENCOUNTER — Encounter: Payer: Self-pay | Admitting: *Deleted

## 2015-09-09 ENCOUNTER — Encounter: Admitting: Obstetrics & Gynecology

## 2015-09-09 DIAGNOSIS — N9489 Other specified conditions associated with female genital organs and menstrual cycle: Secondary | ICD-10-CM

## 2015-09-19 ENCOUNTER — Encounter: Payer: Self-pay | Admitting: Internal Medicine

## 2015-09-22 ENCOUNTER — Encounter: Payer: Self-pay | Admitting: Internal Medicine

## 2015-10-03 ENCOUNTER — Ambulatory Visit: Admitting: Internal Medicine

## 2015-10-18 ENCOUNTER — Encounter (HOSPITAL_COMMUNITY): Payer: Self-pay | Admitting: Emergency Medicine

## 2015-10-18 ENCOUNTER — Ambulatory Visit (HOSPITAL_COMMUNITY)
Admission: EM | Admit: 2015-10-18 | Discharge: 2015-10-18 | Disposition: A | Discharge status: Home / Self Care | Attending: Family Medicine | Admitting: Family Medicine

## 2015-10-18 DIAGNOSIS — N898 Other specified noninflammatory disorders of vagina: Secondary | ICD-10-CM | POA: Insufficient documentation

## 2015-10-18 DIAGNOSIS — R112 Nausea with vomiting, unspecified: Secondary | ICD-10-CM | POA: Diagnosis present

## 2015-10-18 DIAGNOSIS — N760 Acute vaginitis: Secondary | ICD-10-CM | POA: Diagnosis not present

## 2015-10-18 DIAGNOSIS — L292 Pruritus vulvae: Secondary | ICD-10-CM | POA: Diagnosis not present

## 2015-10-18 DIAGNOSIS — A499 Bacterial infection, unspecified: Secondary | ICD-10-CM

## 2015-10-18 DIAGNOSIS — Z79899 Other long term (current) drug therapy: Secondary | ICD-10-CM | POA: Insufficient documentation

## 2015-10-18 DIAGNOSIS — E669 Obesity, unspecified: Secondary | ICD-10-CM | POA: Insufficient documentation

## 2015-10-18 DIAGNOSIS — B9689 Other specified bacterial agents as the cause of diseases classified elsewhere: Secondary | ICD-10-CM

## 2015-10-18 LAB — POCT URINALYSIS DIP (DEVICE)
Bilirubin Urine: NEGATIVE
Glucose, UA: NEGATIVE mg/dL
HGB URINE DIPSTICK: NEGATIVE
Ketones, ur: NEGATIVE mg/dL
Leukocytes, UA: NEGATIVE
NITRITE: NEGATIVE
PH: 7 (ref 5.0–8.0)
PROTEIN: NEGATIVE mg/dL
Specific Gravity, Urine: 1.015 (ref 1.005–1.030)
UROBILINOGEN UA: 1 mg/dL (ref 0.0–1.0)

## 2015-10-18 LAB — POCT PREGNANCY, URINE: PREG TEST UR: NEGATIVE

## 2015-10-18 MED ORDER — METRONIDAZOLE 500 MG PO TABS
500.0000 mg | ORAL_TABLET | Freq: Two times a day (BID) | ORAL | Status: DC
Start: 2015-10-18 — End: 2015-11-28

## 2015-10-18 MED ORDER — ONDANSETRON HCL 4 MG PO TABS: 4.0000 mg | ORAL_TABLET | Freq: Three times a day (TID) | ORAL | Status: DC | PRN

## 2015-10-18 NOTE — ED Provider Notes (Signed)
CSN: [id number]     Arrival date & time 10/18/15  1509 History   First MD Initiated Contact with Patient 10/18/15 1545     Chief Complaint  Patient presents with  . Emesis  . Vaginal Itching   (Consider location/radiation/quality/duration/timing/severity/associated sxs/prior Treatment) HPI Comments: 20 year old female states she awoke this morning with nausea and vomiting. She vomited 2 times this morning and once this afternoon at 3 PM. No vomiting since. Denies abdominal pain or diarrhea. Denies fever, chills, chest pain or shortness of breath.  She states that while she is here she would like to have her vaginal itching and dryness checked out. This started about 2 days ago.  Patient is a 20 y.o. female presenting with vomiting and vaginal itching.  Emesis Associated symptoms: no abdominal pain and no diarrhea   Vaginal Itching Pertinent negatives include no abdominal pain.    Past Medical History  Diagnosis Date  . Obesity    History reviewed. No pertinent past surgical history. Family History  Problem Relation Age of Onset  . Hypertension Mother 50  . Anxiety disorder Mother   . Hyperlipidemia Father 43  . Stroke Maternal Grandmother   . Anxiety disorder Sister    Social History  Substance Use Topics  . Smoking status: Never Smoker   . Smokeless tobacco: Never Used  . Alcohol Use: No   OB History    Gravida Para Term Preterm AB TAB SAB Ectopic Multiple Living   Review of Systems  Constitutional: Negative.  Negative for fever.  HENT: Negative.   Respiratory: Negative.   Gastrointestinal: Positive for nausea and vomiting. Negative for abdominal pain, diarrhea, constipation and blood in stool.  Genitourinary: Negative for dysuria, frequency, vaginal bleeding, vaginal discharge and pelvic pain.  Musculoskeletal: Negative.   Skin: Negative.   Neurological: Negative.   All other systems reviewed and are negative.   Allergies  Review of patient's  allergies indicates no known allergies.  Home Medications   Prior to Admission medications   Medication Sig Start Date End Date Taking? Authorizing Provider  acetaminophen (TYLENOL) 500 MG tablet Take 1 tablet (500 mg total) by mouth every 6 (six) hours as needed. 05/21/15   [name], PA-C  albuterol (PROVENTIL HFA;VENTOLIN HFA) 108 (90 BASE) MCG/ACT inhaler Inhale 2 puffs into the lungs every 6 (six) hours as needed for wheezing or shortness of breath. 12/23/14   Rodolph Bong, MD  cephALEXin (KEFLEX) 500 MG capsule Take 1 capsule (500 mg total) by mouth 4 (four) times daily. 05/21/15   Kayla Rose, PA-C  ipratropium (ATROVENT) 0.06 % nasal spray Place 2 sprays into both nostrils 4 (four) times daily. 12/21/14   Rodolph Bong, MD  loratadine (CLARITIN) 10 MG tablet Take 1 tablet (10 mg total) by mouth daily. 03/23/15   Hope Orlene Och, NP  metroNIDAZOLE (FLAGYL) 500 MG tablet Take 1 tablet (500 mg total) by mouth 2 (two) times daily. X 7 days 10/18/15   Hayden Rasmussen, NP  naproxen (NAPROSYN) 500 MG tablet Take 1 tablet (500 mg total) by mouth 2 (two) times daily. 03/23/15   Hope Orlene Och, NP  norethindrone-ethinyl estradiol (OVCON-35, 28,) 0.4-35 MG-MCG tablet Take 1 tablet by mouth daily. 10/05/13   Antionette Char, MD  ondansetron (ZOFRAN) 4 MG tablet Take 1 tablet (4 mg total) by mouth every 8 (eight) hours as needed for nausea. May cause constipation 10/18/15   Hayden Rasmussen,  NP  predniSONE (DELTASONE) 10 MG tablet Take 2 tablets (20 mg total) by mouth 2 (two) times daily with a meal. 03/23/15   Hope Orlene OchM Neese, NP   Meds Ordered and Administered this Visit  Medications - No data to display  BP 118/78 mmHg  Pulse 92  Temp(Src) 97.6 F (36.4 C) (Oral)  Resp 18  SpO2 100%  LMP 10/06/2015 No data found.   Physical Exam  Constitutional: She is oriented to person, place, and time. She appears well-developed and well-nourished. No distress.  Eyes: EOM are normal.  Neck: Normal range of motion. Neck supple.   Cardiovascular: Normal rate, regular rhythm, normal heart sounds and intact distal pulses.   Pulmonary/Chest: Effort normal and breath sounds normal. No respiratory distress. She has no wheezes.  Abdominal: Soft. Bowel sounds are normal. She exhibits no distension. There is no tenderness. There is no rebound and no guarding.  Genitourinary:  Normal external female genitalia. There is a small amount of thick white to gray discharge flowing from the vagina. Some pallor to the vaginal walls along with the discharge otherwise no lesions. Cervix is midline and coated with this discharge. No erythema or cervical lesions observed. No CMT.  Musculoskeletal: She exhibits no edema.  Neurological: She is alert and oriented to person, place, and time. She exhibits normal muscle tone.  Skin: Skin is warm and dry.  Psychiatric: She has a normal mood and affect.  Nursing note and vitals reviewed.   ED Course  Procedures (including critical care time)  Labs Review Labs Reviewed  POCT URINALYSIS DIP (DEVICE)  POCT PREGNANCY, URINE  CERVICOVAGINAL ANCILLARY ONLY   Results for orders placed or performed during the hospital encounter of 10/18/15  POCT urinalysis dip (device)  Result Value Ref Range   Glucose, UA NEGATIVE NEGATIVE mg/dL   Bilirubin Urine NEGATIVE NEGATIVE   Ketones, ur NEGATIVE NEGATIVE mg/dL   Specific Gravity, Urine 1.015 1.005 - 1.030   Hgb urine dipstick NEGATIVE NEGATIVE   pH 7.0 5.0 - 8.0   Protein, ur NEGATIVE NEGATIVE mg/dL   Urobilinogen, UA 1.0 0.0 - 1.0 mg/dL   Nitrite NEGATIVE NEGATIVE   Leukocytes, UA NEGATIVE NEGATIVE  Pregnancy, urine POC  Result Value Ref Range   Preg Test, Ur NEGATIVE NEGATIVE     Imaging Review No results found.   Visual Acuity Review  Right Eye Distance:   Left Eye Distance:   Bilateral Distance:    Right Eye Near:   Left Eye Near:    Bilateral Near:         MDM   1. Non-intractable vomiting with nausea, vomiting of  unspecified type   2. Vaginal discharge   3. BV (bacterial vaginosis)    Meds ordered this encounter  Medications  . metroNIDAZOLE (FLAGYL) 500 MG tablet    Sig: Take 1 tablet (500 mg total) by mouth 2 (two) times daily. X 7 days    Dispense:  14 tablet    Refill:  0    Order Specific Question:  Supervising Provider    Answer:  Linna HoffKINDL, JAMES D [phone][[id number]]  . ondansetron (ZOFRAN) 4 MG tablet    Sig: Take 1 tablet (4 mg total) by mouth every 8 (eight) hours as needed for nausea. May cause constipation    Dispense:  12 tablet    Refill:  0    Order Specific Question:  Supervising Provider    Answer:  Linna HoffKINDL, JAMES D [phone][[id number]]   Clear liquids for 24h. No ETOH  Start Flagyl tomorrow.    Hayden Rasmussen, NP 10/18/15 [phone]

## 2015-10-18 NOTE — Discharge Instructions (Signed)
Bacterial Vaginosis Bacterial vaginosis is a vaginal infection that occurs when the normal balance of bacteria in the vagina is disrupted. It results from an overgrowth of certain bacteria. This is the most common vaginal infection in women of childbearing age. Treatment is important to prevent complications, especially in pregnant women, as it can cause a premature delivery. CAUSES  Bacterial vaginosis is caused by an increase in harmful bacteria that are normally present in smaller amounts in the vagina. Several different kinds of bacteria can cause bacterial vaginosis. However, the reason that the condition develops is not fully understood. RISK FACTORS Certain activities or behaviors can put you at an increased risk of developing bacterial vaginosis, including:  Having a new sex partner or multiple sex partners.  Douching.  Using an intrauterine device (IUD) for contraception. Women do not get bacterial vaginosis from toilet seats, bedding, swimming pools, or contact with objects around them. SIGNS AND SYMPTOMS  Some women with bacterial vaginosis have no signs or symptoms. Common symptoms include:  Grey vaginal discharge.  A fishlike odor with discharge, especially after sexual intercourse.  Itching or burning of the vagina and vulva.  Burning or pain with urination. DIAGNOSIS  Your health care provider will take a medical history and examine the vagina for signs of bacterial vaginosis. A sample of vaginal fluid may be taken. Your health care provider will look at this sample under a microscope to check for bacteria and abnormal cells. A vaginal pH test may also be done.  TREATMENT  Bacterial vaginosis may be treated with antibiotic medicines. These may be given in the form of a pill or a vaginal cream. A second round of antibiotics may be prescribed if the condition comes back after treatment. Because bacterial vaginosis increases your risk for sexually transmitted diseases, getting  treated can help reduce your risk for chlamydia, gonorrhea, HIV, and herpes. HOME CARE INSTRUCTIONS   Only take over-the-counter or prescription medicines as directed by your health care provider.  If antibiotic medicine was prescribed, take it as directed. Make sure you finish it even if you start to feel better.  Tell all sexual partners that you have a vaginal infection. They should see their health care provider and be treated if they have problems, such as a mild rash or itching.  During treatment, it is important that you follow these instructions:  Avoid sexual activity or use condoms correctly.  Do not douche.  Avoid alcohol as directed by your health care provider.  Avoid breastfeeding as directed by your health care provider. SEEK MEDICAL CARE IF:   Your symptoms are not improving after 3 days of treatment.  You have increased discharge or pain.  You have a fever. MAKE SURE YOU:   Understand these instructions.  Will watch your condition.  Will get help right away if you are not doing well or get worse. FOR MORE INFORMATION  Centers for Disease Control and Prevention, Division of STD Prevention: SolutionApps.co.zawww.cdc.gov/std American Sexual Health Association (ASHA): www.ashastd.org    This information is not intended to replace advice given to you by your health care provider. Make sure you discuss any questions you have with your health care provider.   Document Released: 06/28/2005 Document Revised: 07/19/2014 Document Reviewed: 02/07/2013 Elsevier Interactive Patient Education 2016 [employer] Inc[employer]  Nausea and Vomiting Nausea is a sick feeling that often comes before throwing up (vomiting). Vomiting is a reflex where stomach contents come out of your mouth. Vomiting can cause severe loss of body fluids (dehydration).  Children and elderly adults can become dehydrated quickly, especially if they also have diarrhea. Nausea and vomiting are symptoms of a condition or disease. It  is important to find the cause of your symptoms. CAUSES   Direct irritation of the stomach lining. This irritation can result from increased acid production (gastroesophageal reflux disease), infection, food poisoning, taking certain medicines (such as nonsteroidal anti-inflammatory drugs), alcohol use, or tobacco use.  Signals from the brain.These signals could be caused by a headache, heat exposure, an inner ear disturbance, increased pressure in the brain from injury, infection, a tumor, or a concussion, pain, emotional stimulus, or metabolic problems.  An obstruction in the gastrointestinal tract (bowel obstruction).  Illnesses such as diabetes, hepatitis, gallbladder problems, appendicitis, kidney problems, cancer, sepsis, atypical symptoms of a heart attack, or eating disorders.  Medical treatments such as chemotherapy and radiation.  Receiving medicine that makes you sleep (general anesthetic) during surgery. DIAGNOSIS Your caregiver may ask for tests to be done if the problems do not improve after a few days. Tests may also be done if symptoms are severe or if the reason for the nausea and vomiting is not clear. Tests may include:  Urine tests.  Blood tests.  Stool tests.  Cultures (to look for evidence of infection).  X-rays or other imaging studies. Test results can help your caregiver make decisions about treatment or the need for additional tests. TREATMENT You need to stay well hydrated. Drink frequently but in small amounts.You may wish to drink water, sports drinks, clear broth, or eat frozen ice pops or gelatin dessert to help stay hydrated.When you eat, eating slowly may help prevent nausea.There are also some antinausea medicines that may help prevent nausea. HOME CARE INSTRUCTIONS   Take all medicine as directed by your caregiver.  If you do not have an appetite, do not force yourself to eat. However, you must continue to drink fluids.  If you have an  appetite, eat a normal diet unless your caregiver tells you differently.  Eat a variety of complex carbohydrates (rice, wheat, potatoes, bread), lean meats, yogurt, fruits, and vegetables.  Avoid high-fat foods because they are more difficult to digest.  Drink enough water and fluids to keep your urine clear or pale yellow.  If you are dehydrated, ask your caregiver for specific rehydration instructions. Signs of dehydration may include:  Severe thirst.  Dry lips and mouth.  Dizziness.  Dark urine.  Decreasing urine frequency and amount.  Confusion.  Rapid breathing or pulse. SEEK IMMEDIATE MEDICAL CARE IF:   You have blood or brown flecks (like coffee grounds) in your vomit.  You have black or bloody stools.  You have a severe headache or stiff neck.  You are confused.  You have severe abdominal pain.  You have chest pain or trouble breathing.  You do not urinate at least once every 8 hours.  You develop cold or clammy skin.  You continue to vomit for longer than 24 to 48 hours.  You have a fever. MAKE SURE YOU:   Understand these instructions.  Will watch your condition.  Will get help right away if you are not doing well or get worse.   This information is not intended to replace advice given to you by your health care provider. Make sure you discuss any questions you have with your health care provider.   Document Released: 06/28/2005 Document Revised: 09/20/2011 Document Reviewed: 11/25/2010 Elsevier Interactive Patient Education 2016 [employer].  Vaginitis Vaginitis is an inflammation  of the vagina. It can happen when the normal bacteria and yeast in the vagina grow too much. There are different types. Treatment will depend on the type you have. HOME CARE  Take all medicines as told by your doctor.  Keep your vagina area clean and dry. Avoid soap. Rinse the area with water.  Avoid washing and cleaning out the vagina (douching).  Do not use  tampons or have sex (intercourse) until your treatment is done.  Wipe from front to back after going to the restroom.  Wear cotton underwear.  Avoid wearing underwear while you sleep until your vaginitis is gone.  Avoid tight pants. Avoid underwear or nylons without a cotton panel.  Take off wet clothing (such as a bathing suit) as soon as you can.  Use mild, unscented products. Avoid fabric softeners and scented:  Feminine sprays.  Laundry detergents.  Tampons.  Soaps or bubble baths.  Practice safe sex and use condoms. GET HELP RIGHT AWAY IF:   You have belly (abdominal) pain.  You have a fever or lasting symptoms for more than 2-3 days.  You have a fever and your symptoms suddenly get worse. MAKE SURE YOU:   Understand these instructions.  Will watch this condition.  Will get help right away if you are not doing well or get worse.   This information is not intended to replace advice given to you by your health care provider. Make sure you discuss any questions you have with your health care provider.   Document Released: 09/24/2008 Document Revised: 03/22/2012 Document Reviewed: 12/09/2011 Elsevier Interactive Patient Education [employer]

## 2015-10-18 NOTE — ED Notes (Signed)
Call back number verified and updated in EPIC

## 2015-10-18 NOTE — ED Notes (Signed)
Pt reports she woke up this am feeling nauseas and started to vomit.... Also c/o vag itching and dryness onset x2 days... A&O x4... No acute distress.

## 2015-10-20 LAB — CERVICOVAGINAL ANCILLARY ONLY
CHLAMYDIA, DNA PROBE: POSITIVE — AB
NEISSERIA GONORRHEA: POSITIVE — AB

## 2015-10-21 LAB — CERVICOVAGINAL ANCILLARY ONLY: Wet Prep (BD Affirm): POSITIVE — AB

## 2015-10-23 ENCOUNTER — Telehealth (HOSPITAL_COMMUNITY): Payer: Self-pay | Admitting: Emergency Medicine

## 2015-10-23 ENCOUNTER — Telehealth: Payer: Self-pay | Admitting: Emergency Medicine

## 2015-10-23 NOTE — ED Notes (Signed)
LM on pt's VM [phone] Need to give lab results from recent visit on 4/08 Also let pt know labs can be obtained from MyChart  Per Dr. Dayton ScrapeMurray,  Clinical staff, please let patient and health department know that tests for gonorrhea and chlamydia were positive.  Pt will need tx with IM rocephin 250mg  and po zithromax 1g. Test for gardnerella (bacterial vaginosis) was also positive, and can be treated with metronidazole 500mg  bid x 7d #14 no refills.  Recheck or followup pcp/Monica Montez MoritaCarter as needed. LM   Will wait til pt comes in and gets treated before [facility]

## 2015-10-29 ENCOUNTER — Telehealth (HOSPITAL_COMMUNITY): Payer: Self-pay | Admitting: Emergency Medicine

## 2015-10-29 NOTE — ED Notes (Signed)
Called [phone] and mult numbers on file w/o success.  Need to give lab results from recent visit on 10/18/15  Per Dr. Dayton ScrapeMurray,  Clinical staff, please let patient and health department know that tests for gonorrhea and chlamydia were positive.  Pt will need tx with IM rocephin 250mg  and po zithromax 1g. Test for gardnerella (bacterial vaginosis) was also positive, and can be treated with metronidazole 500mg  bid x 7d #14 no refills.  Recheck or followup pcp/Monica Montez MoritaCarter as needed. LM  Mailed letter as 3rd attempt... [facility] pt has not been treated and needs to be contacted.

## 2015-10-31 ENCOUNTER — Ambulatory Visit: Admitting: Internal Medicine

## 2015-11-05 ENCOUNTER — Encounter (HOSPITAL_COMMUNITY): Payer: Self-pay | Admitting: Emergency Medicine

## 2015-11-05 ENCOUNTER — Ambulatory Visit (HOSPITAL_COMMUNITY)
Admission: EM | Admit: 2015-11-05 | Discharge: 2015-11-05 | Disposition: A | Discharge status: Home / Self Care | Attending: Emergency Medicine | Admitting: Emergency Medicine

## 2015-11-05 DIAGNOSIS — A749 Chlamydial infection, unspecified: Secondary | ICD-10-CM | POA: Diagnosis not present

## 2015-11-05 DIAGNOSIS — A64 Unspecified sexually transmitted disease: Secondary | ICD-10-CM | POA: Diagnosis present

## 2015-11-05 DIAGNOSIS — A5400 Gonococcal infection of lower genitourinary tract, unspecified: Secondary | ICD-10-CM | POA: Diagnosis not present

## 2015-11-05 DIAGNOSIS — B9689 Other specified bacterial agents as the cause of diseases classified elsewhere: Secondary | ICD-10-CM | POA: Insufficient documentation

## 2015-11-05 DIAGNOSIS — N760 Acute vaginitis: Secondary | ICD-10-CM | POA: Diagnosis not present

## 2015-11-05 MED ORDER — CEFTRIAXONE SODIUM 250 MG IJ SOLR
INTRAMUSCULAR | Status: AC
  Filled 2015-11-05: qty 250

## 2015-11-05 MED ORDER — AZITHROMYCIN 250 MG PO TABS
1000.0000 mg | ORAL_TABLET | Freq: Once | ORAL | Status: AC
  Administered 2015-11-05: 1000 mg via ORAL

## 2015-11-05 MED ORDER — CEFTRIAXONE SODIUM 250 MG IJ SOLR
250.0000 mg | Freq: Once | INTRAMUSCULAR | Status: AC
  Administered 2015-11-05: 250 mg via INTRAMUSCULAR

## 2015-11-05 MED ORDER — AZITHROMYCIN 250 MG PO TABS
ORAL_TABLET | ORAL | Status: AC
  Filled 2015-11-05: qty 4

## 2015-11-05 MED ORDER — LIDOCAINE HCL (PF) 1 % IJ SOLN
INTRAMUSCULAR | Status: AC
  Filled 2015-11-05: qty 5

## 2015-11-05 NOTE — ED Provider Notes (Signed)
CSN: [id number]     Arrival date & time 11/05/15  1258 History   First MD Initiated Contact with Patient 11/05/15 1320     Chief Complaint  Patient presents with  . SEXUALLY TRANSMITTED DISEASE   (Consider location/radiation/quality/duration/timing/severity/associated sxs/prior Treatment) HPI Pt was contacted because of positive STD cultures. Here for treatment at this time. Also had been diagnosed with BV but did not get the meds at the pharmacy.  Past Medical History  Diagnosis Date  . Obesity    History reviewed. No pertinent past surgical history. Family History  Problem Relation Age of Onset  . Hypertension Mother [id number]  . Anxiety disorder Mother   . Hyperlipidemia Father 2042  . Stroke Maternal Grandmother   . Anxiety disorder Sister    Social History  Substance Use Topics  . Smoking status: Never Smoker   . Smokeless tobacco: Never Used  . Alcohol Use: No   OB History    Gravida Para Term Preterm AB TAB SAB Ectopic Multiple Living   1    1 1          Review of Systems ROS +'ve std cultures  Denies: HEADACHE, NAUSEA, ABDOMINAL PAIN, CHEST PAIN, CONGESTION, DYSURIA, SHORTNESS OF BREATH  Allergies  Review of patient's allergies indicates no known allergies.  Home Medications   Prior to Admission medications   Medication Sig Start Date End Date Taking? Authorizing Provider  acetaminophen (TYLENOL) 500 MG tablet Take 1 tablet (500 mg total) by mouth every 6 (six) hours as needed. 05/21/15   Cheri FowlerKayla Rose, PA-C  albuterol (PROVENTIL HFA;VENTOLIN HFA) 108 (90 BASE) MCG/ACT inhaler Inhale 2 puffs into the lungs every 6 (six) hours as needed for wheezing or shortness of breath. 12/23/14   Rodolph BongEvan S Corey, MD  cephALEXin (KEFLEX) 500 MG capsule Take 1 capsule (500 mg total) by mouth 4 (four) times daily. Patient not taking: Reported on 11/05/2015 05/21/15   Cheri FowlerKayla Rose, PA-C  ipratropium (ATROVENT) 0.06 % nasal spray Place 2 sprays into both nostrils 4 (four) times daily. 12/21/14   Rodolph BongEvan  S Corey, MD  loratadine (CLARITIN) 10 MG tablet Take 1 tablet (10 mg total) by mouth daily. 03/23/15   Hope Orlene OchM Neese, NP  metroNIDAZOLE (FLAGYL) 500 MG tablet Take 1 tablet (500 mg total) by mouth 2 (two) times daily. X 7 days Patient not taking: Reported on 11/05/2015 10/18/15   Hayden Rasmussenavid Mabe, NP  naproxen (NAPROSYN) 500 MG tablet Take 1 tablet (500 mg total) by mouth 2 (two) times daily. 03/23/15   Hope Orlene OchM Neese, NP  norethindrone-ethinyl estradiol (OVCON-35, 28,) 0.4-35 MG-MCG tablet Take 1 tablet by mouth daily. Patient not taking: Reported on 11/05/2015 10/05/13   Antionette CharLisa Jackson-Moore, MD  ondansetron (ZOFRAN) 4 MG tablet Take 1 tablet (4 mg total) by mouth every 8 (eight) hours as needed for nausea. May cause constipation 10/18/15   Hayden Rasmussenavid Mabe, NP  predniSONE (DELTASONE) 10 MG tablet Take 2 tablets (20 mg total) by mouth 2 (two) times daily with a meal. Patient not taking: Reported on 11/05/2015 03/23/15   Janne NapoleonHope M Neese, NP   Meds Ordered and Administered this Visit   Medications  cefTRIAXone (ROCEPHIN) injection 250 mg (not administered)  azithromycin (ZITHROMAX) tablet 1,000 mg (not administered)    BP 130/75 mmHg  Pulse 72  Temp(Src) 98.4 F (36.9 C) (Oral)  Resp 16  SpO2 100%  LMP 09/29/2015 No data found.   Physical Exam NURSES NOTES AND VITAL SIGNS REVIEWED. CONSTITUTIONAL: Well developed, well nourished, no acute  distress HEENT: normocephalic, atraumatic EYES: Conjunctiva normal NECK:normal ROM, supple, no adenopathy PULMONARY:No respiratory distress, normal effort ABDOMINAL: Soft, ND, NT BS+, No CVAT MUSCULOSKELETAL: Normal ROM of all extremities,  SKIN: warm and dry without rash PSYCHIATRIC: Mood and affect, behavior are normal  ED Course  Procedures (including critical care time)  Labs Review Labs Reviewed - No data to display  Imaging Review No results found.   Visual Acuity Review  Right Eye Distance:   Left Eye Distance:   Bilateral Distance:    Right Eye  Near:   Left Eye Near:    Bilateral Near:      Also suggest HIV testing.   MDM   1. STD (female)   2. BV (bacterial vaginosis)     Patient is reassured that there are no issues that require transfer to higher level of care at this time or additional tests. Patient is advised to continue home symptomatic treatment. Patient is advised that if there are new or worsening symptoms to attend the emergency department, contact primary care provider, or return to UC. Instructions of care provided discharged home in stable condition.    THIS NOTE WAS GENERATED USING A VOICE RECOGNITION SOFTWARE PROGRAM. ALL REASONABLE EFFORTS  WERE MADE TO PROOFREAD THIS DOCUMENT FOR ACCURACY.  I have verbally reviewed the discharge instructions with the patient. A printed AVS was given to the patient.  All questions were answered prior to discharge.      Tharon Aquas, PA 11/05/15 1409

## 2015-11-05 NOTE — ED Notes (Signed)
Patient is returning for treatment for std.  See dr Dayton Scrapemurray notation.  Initial visit 4/8

## 2015-11-05 NOTE — Discharge Instructions (Signed)
Bacterial Vaginosis BE SURE TO GET YOUR METRONIDAZOLE THAT WAS PRESCRIBED FOR YOU Bacterial vaginosis is a vaginal infection that occurs when the normal balance of bacteria in the vagina is disrupted. It results from an overgrowth of certain bacteria. This is the most common vaginal infection in women of childbearing age. Treatment is important to prevent complications, especially in pregnant women, as it can cause a premature delivery. CAUSES  Bacterial vaginosis is caused by an increase in harmful bacteria that are normally present in smaller amounts in the vagina. Several different kinds of bacteria can cause bacterial vaginosis. However, the reason that the condition develops is not fully understood. RISK FACTORS Certain activities or behaviors can put you at an increased risk of developing bacterial vaginosis, including:  Having a new sex partner or multiple sex partners.  Douching.  Using an intrauterine device (IUD) for contraception. Women do not get bacterial vaginosis from toilet seats, bedding, swimming pools, or contact with objects around them. SIGNS AND SYMPTOMS  Some women with bacterial vaginosis have no signs or symptoms. Common symptoms include:  Grey vaginal discharge.  A fishlike odor with discharge, especially after sexual intercourse.  Itching or burning of the vagina and vulva.  Burning or pain with urination. DIAGNOSIS  Your health care provider will take a medical history and examine the vagina for signs of bacterial vaginosis. A sample of vaginal fluid may be taken. Your health care provider will look at this sample under a microscope to check for bacteria and abnormal cells. A vaginal pH test may also be done.  TREATMENT  Bacterial vaginosis may be treated with antibiotic medicines. These may be given in the form of a pill or a vaginal cream. A second round of antibiotics may be prescribed if the condition comes back after treatment. Because bacterial vaginosis  increases your risk for sexually transmitted diseases, getting treated can help reduce your risk for chlamydia, gonorrhea, HIV, and herpes. HOME CARE INSTRUCTIONS   Only take over-the-counter or prescription medicines as directed by your health care provider.  If antibiotic medicine was prescribed, take it as directed. Make sure you finish it even if you start to feel better.  Tell all sexual partners that you have a vaginal infection. They should see their health care provider and be treated if they have problems, such as a mild rash or itching.  During treatment, it is important that you follow these instructions:  Avoid sexual activity or use condoms correctly.  Do not douche.  Avoid alcohol as directed by your health care provider.  Avoid breastfeeding as directed by your health care provider. SEEK MEDICAL CARE IF:   Your symptoms are not improving after 3 days of treatment.  You have increased discharge or pain.  You have a fever. MAKE SURE YOU:   Understand these instructions.  Will watch your condition.  Will get help right away if you are not doing well or get worse. FOR MORE INFORMATION  Centers for Disease Control and Prevention, Division of STD Prevention: [employer] American Sexual Health Association (ASHA): www.ashastd.org    This information is not intended to replace advice given to you by your health care provider. Make sure you discuss any questions you have with your health care provider.   Document Released: 06/28/2005 Document Revised: 07/19/2014 Document Reviewed: 02/07/2013 Elsevier Interactive Patient Education 2016 [employer]. Safe Sex Safe sex is about reducing the risk of giving or getting a sexually transmitted disease (STD). STDs are spread through sexual contact involving the  genitals, mouth, or rectum. Some STDs can be cured and others cannot. Safe sex can also prevent unintended pregnancies.  WHAT ARE SOME SAFE SEX PRACTICES?  Limit  your sexual activity to only one partner who is having sex with only you.  Talk to your partner about his or her past partners, past STDs, and drug use.  Use a condom every time you have sexual intercourse. This includes vaginal, oral, and anal sexual activity. Both females and males should wear condoms during oral sex. Only use latex or polyurethane condoms and water-based lubricants. Using petroleum-based lubricants or oils to lubricate a condom will weaken the condom and increase the chance that it will break. The condom should be in place from the beginning to the end of sexual activity. Wearing a condom reduces, but does not completely eliminate, your risk of getting or giving an STD. STDs can be spread by contact with infected body fluids and skin.  Get vaccinated for hepatitis B and HPV.  Avoid alcohol and recreational drugs, which can affect your judgment. You may forget to use a condom or participate in high-risk sex.  For females, avoid douching after sexual intercourse. Douching can spread an infection farther into the reproductive tract.  Check your body for signs of sores, blisters, rashes, or unusual discharge. See your health care provider if you notice any of these signs.  Avoid sexual contact if you have symptoms of an infection or are being treated for an STD. If you or your partner has herpes, avoid sexual contact when blisters are present. Use condoms at all other times.  If you are at risk of being infected with HIV, it is recommended that you take a prescription medicine daily to prevent HIV infection. This is called pre-exposure prophylaxis (PrEP). You are considered at risk if:  You are a man who has sex with other men (MSM).  You are a heterosexual man or woman who is sexually active with more than one partner.  You take drugs by injection.  You are sexually active with a partner who has HIV.  Talk with your health care provider about whether you are at high risk of  being infected with HIV. If you choose to begin PrEP, you should first be tested for HIV. You should then be tested every 3 months for as long as you are taking PrEP.  See your health care provider for regular screenings, exams, and tests for other STDs. Before having sex with a new partner, each of you should be screened for STDs and should talk about the results with each other. WHAT ARE THE BENEFITS OF SAFE SEX?   There is less chance of getting or giving an STD.  You can prevent unwanted or unintended pregnancies.  By discussing safe sex concerns with your partner, you may increase feelings of intimacy, comfort, trust, and honesty between the two of you.   This information is not intended to replace advice given to you by your health care provider. Make sure you discuss any questions you have with your health care provider.   Document Released: 08/05/2004 Document Revised: 07/19/2014 Document Reviewed: 12/20/2011 Elsevier Interactive Patient Education [employer] Inc. Gonorrhea Gonorrhea is an infection that can cause serious problems. If left untreated, the infection may:   Damage the female or female organs.   Cause women to be unable to have children (sterility).   Harm a fetus if the infected woman is pregnant.  It is important to get treatment for gonorrhea as  soon as possible. It is also necessary that all your sexual partners be tested for the infection.  CAUSES  Gonorrhea is caused by bacteria called Neisseria gonorrhoeae. The infection is spread from person to person, usually by sexual contact (such as by anal, vaginal, or oral means). A newborn can contract the infection from his or her mother during birth.  RISK FACTORS  Being a woman younger than 20 years of age who is sexually active.  Being a woman 78 years of age or older who has:  A new sex partner.  More than one sex partner.  A sex partner who has a sexually transmitted disease (STD).  Using condoms  inconsistently.  Currently having, or having previously had, an STD.  Exchanging sex or money or drugs. SYMPTOMS  Some people with gonorrhea do not have symptoms. Symptoms may be different in females and males.  Females The most common symptoms are:   Pain in the lower abdomen.   Fever with or without chills.  Other symptoms include:   Abnormal vaginal discharge.   Painful intercourse.   Burning or itching of the vagina or lips of the vagina.   Abnormal vaginal bleeding.   Pain when urinating.   Long-lasting (chronic) pain in the lower abdomen, especially during menstruation or intercourse.   Inability to become pregnant.   Going into premature labor.   Irritation, pain, bleeding, or discharge from the rectum. This may occur if the infection was spread by anal sex.   Sore throat or swollen lymph nodes in the neck. This may occur if the infection was spread by oral sex.  Males The most common symptoms are:   Discharge from the penis.   Pain or burning during urination.   Pain or swelling in the testicles. Other symptoms may include:   Irritation, pain, bleeding, or discharge from the rectum. This may occur if the infection was spread by anal sex.   Sore throat, fever, or swollen lymph nodes in the neck. This may occur if the infection was spread by oral sex.  DIAGNOSIS  A diagnosis is made after a physical exam is done and a sample of discharge is examined under a microscope for the presence of the bacteria. The discharge may be taken from the urethra, cervix, throat, or rectum.  TREATMENT  Gonorrhea is treated with antibiotic medicines. It is important for treatment to begin as soon as possible. Early treatment may prevent some problems from developing. Do not have sex. Avoid all types of sexual activity for 7 days after treatment is complete and until any sex partners have been treated. HOME CARE INSTRUCTIONS   Take medicines only as directed by  your health care provider.   Take your antibiotic medicine as directed by your health care provider. Finish the antibiotic even if you start to feel better. Incomplete treatment will put you at risk for continued infection.   Do not have sex until treatment is complete or as directed by your health care provider.   Keep all follow-up visits as directed by your health care provider.   Not all test results are available during your visit. If your test results are not back during the visit, make an appointment with your health care provider to find out the results. Do not assume everything is normal if you have not heard from your health care provider or the medical facility. It is your responsibility to get your test results.  If you test positive for gonorrhea, inform your recent  sexual partners. They need to be checked for gonorrhea even if they do not have symptoms. They may need treatment, even if they test negative for gonorrhea.  SEEK MEDICAL CARE IF:   You develop any bad reaction to the medicine you were prescribed. This may include:   A rash.   Nausea.   Vomiting.   Diarrhea.   Your symptoms do not improve after a few days of taking antibiotics.   Your symptoms get worse.   You develop increased pain, such as in the testicles (for males) or in the abdomen (for females).  You have a fever. MAKE SURE YOU:   Understand these instructions.  Will watch your condition.  Will get help right away if you are not doing well or get worse.   This information is not intended to replace advice given to you by your health care provider. Make sure you discuss any questions you have with your health care provider.   Document Released: 06/25/2000 Document Revised: 07/19/2014 Document Reviewed: 01/03/2013 Elsevier Interactive Patient Education 2016 Elsevier Inc. Chlamydia, Female Chlamydia is an infection. It is spread from one person to another person during sexual  contact. This infection can be in the cervix, urine tube (urethra), throat, or bottom (rectum). This infection needs treatment. HOME CARE   Take your medicines (antibiotics) as told. Finish them even if you start to feel better.  Only take medicine as told by your doctor.  Tell your sex partner(s) that you have chlamydia. They must also be treated.  Do not have sex until your doctor says it is okay.  Rest.  Eat healthy. Drink enough fluids to keep your pee (urine) clear or pale yellow.  Keep all doctor visits as told. GET HELP IF:  You have pain when you pee.  You have belly pain.  You have vaginal discharge.  You have pain during sex.  You have bleeding between periods and after sex.  You have a fever. GET HELP RIGHT AWAY IF:   You feel sick to your stomach (nauseous) or you throw up (vomit).  You sweat much more than normal (diaphoresis).  You have trouble swallowing.   This information is not intended to replace advice given to you by your health care provider. Make sure you discuss any questions you have with your health care provider.   Document Released: 04/06/2008 Document Revised: 03/19/2015 Document Reviewed: 03/05/2013 Elsevier Interactive Patient Education [employer]

## 2015-11-06 LAB — HIV ANTIBODY (ROUTINE TESTING W REFLEX): HIV Screen 4th Generation wRfx: NONREACTIVE

## 2015-11-28 ENCOUNTER — Encounter (HOSPITAL_COMMUNITY): Payer: Self-pay | Admitting: Emergency Medicine

## 2015-11-28 ENCOUNTER — Ambulatory Visit (HOSPITAL_COMMUNITY)
Admission: EM | Admit: 2015-11-28 | Discharge: 2015-11-28 | Disposition: A | Discharge status: Home / Self Care | Attending: Emergency Medicine | Admitting: Emergency Medicine

## 2015-11-28 DIAGNOSIS — J069 Acute upper respiratory infection, unspecified: Secondary | ICD-10-CM | POA: Diagnosis not present

## 2015-11-28 MED ORDER — IBUPROFEN 800 MG PO TABS: 800.0000 mg | ORAL_TABLET | Freq: Three times a day (TID) | ORAL | Status: DC

## 2015-11-28 NOTE — ED Provider Notes (Signed)
CSN: [id number]     Arrival date & time 11/28/15  1702 History   First MD Initiated Contact with Patient 11/28/15 1827     Chief Complaint  Patient presents with  . URI   (Consider location/radiation/quality/duration/timing/severity/associated sxs/prior Treatment) HPI Comments: 19 year old female complaining of a 2 days of cough, anterior chest pain associated with a cough, nasal congestion, runny nose, headache, myalgias and pain across the lower back. Denies fever. Denies shortness of breath.  Patient is a 20 y.o. female presenting with URI.  URI Presenting symptoms: congestion, cough and rhinorrhea   Presenting symptoms: no fatigue, no fever and no sore throat   Associated symptoms: myalgias   Associated symptoms: no neck pain     Past Medical History  Diagnosis Date  . Obesity    History reviewed. No pertinent past surgical history. Family History  Problem Relation Age of Onset  . Hypertension Mother 10  . Anxiety disorder Mother   . Hyperlipidemia Father 45  . Stroke Maternal Grandmother   . Anxiety disorder Sister    Social History  Substance Use Topics  . Smoking status: Never Smoker   . Smokeless tobacco: Never Used  . Alcohol Use: No   OB History    Gravida Para Term Preterm AB TAB SAB Ectopic Multiple Living   Review of Systems  Constitutional: Positive for activity change. Negative for fever, chills, appetite change and fatigue.  HENT: Positive for congestion, postnasal drip and rhinorrhea. Negative for facial swelling and sore throat.   Eyes: Negative.   Respiratory: Positive for cough. Negative for choking and shortness of breath.   Cardiovascular: Negative.   Gastrointestinal: Negative.   Genitourinary: Negative.   Musculoskeletal: Positive for myalgias and back pain. Negative for neck pain and neck stiffness.  Skin: Negative.  Negative for pallor and rash.  Neurological: Negative.     Allergies  Review of patient's allergies  indicates no known allergies.  Home Medications   Prior to Admission medications   Medication Sig Start Date End Date Taking? Authorizing Provider  acetaminophen (TYLENOL) 500 MG tablet Take 1 tablet (500 mg total) by mouth every 6 (six) hours as needed. 05/21/15   [name], PA-C  albuterol (PROVENTIL HFA;VENTOLIN HFA) 108 (90 BASE) MCG/ACT inhaler Inhale 2 puffs into the lungs every 6 (six) hours as needed for wheezing or shortness of breath. 12/23/14   Rodolph Bong, MD  ibuprofen (ADVIL,MOTRIN) 800 MG tablet Take 1 tablet (800 mg total) by mouth 3 (three) times daily. Prn pain and discomfort. 11/28/15   Hayden Rasmussen, NP  ipratropium (ATROVENT) 0.06 % nasal spray Place 2 sprays into both nostrils 4 (four) times daily. 12/21/14   Rodolph Bong, MD  loratadine (CLARITIN) 10 MG tablet Take 1 tablet (10 mg total) by mouth daily. 03/23/15   Hope Orlene Och, NP  naproxen (NAPROSYN) 500 MG tablet Take 1 tablet (500 mg total) by mouth 2 (two) times daily. 03/23/15   Hope Orlene Och, NP  ondansetron (ZOFRAN) 4 MG tablet Take 1 tablet (4 mg total) by mouth every 8 (eight) hours as needed for nausea. May cause constipation 10/18/15   Hayden Rasmussen, NP   Meds Ordered and Administered this Visit  Medications - No data to display  BP 120/82 mmHg  Pulse 80  Temp(Src) 98.7 F (37.1 C) (Oral)  Resp 18  SpO2 99%  LMP 09/29/2015 No data found.   Physical Exam  Constitutional: She is oriented to person, place, and time. She appears well-developed and well-nourished. No distress.  HENT:  Mouth/Throat: No oropharyngeal exudate.  Bilateral TMs mildly retracted. Otherwise normal.  Oropharynx with minor erythema and cobblestoning with clear PND.  Eyes: Conjunctivae and EOM are normal.  Neck: Normal range of motion. Neck supple.  Cardiovascular: Normal rate, regular rhythm and normal heart sounds.   Pulmonary/Chest: Effort normal and breath sounds normal. No respiratory distress. She has no wheezes. She has no rales.   Musculoskeletal: Normal range of motion. She exhibits no edema.  Lymphadenopathy:    She has no cervical adenopathy.  Neurological: She is alert and oriented to person, place, and time.  Skin: Skin is warm and dry. No rash noted.  Psychiatric: She has a normal mood and affect.  Vitals reviewed.   ED Course  Procedures (including critical care time)  Labs Review Labs Reviewed - No data to display  Imaging Review No results found.   Visual Acuity Review  Right Eye Distance:   Left Eye Distance:   Bilateral Distance:    Right Eye Near:   Left Eye Near:    Bilateral Near:         MDM   1. URI (upper respiratory infection)    For nasal and head congestion may take Sudafed PE 10 mg every 4 hours as needed. Saline nasal spray used frequently. For drainage may use Allegra, Claritin or Zyrtec. If you need stronger medicine to stop drainage may take Chlor-Trimeton 2-4 mg every 4 hours. This may cause drowsiness. Ibuprofen 600 mg every 6 hours as needed for pain, discomfort or fever. Drink plenty of fluids and stay well-hydrated.     Hayden Rasmussenavid Kelissa Merlin, NP 11/28/15 1859

## 2015-11-28 NOTE — Discharge Instructions (Signed)
Upper Respiratory Infection, Adult For nasal and head congestion may take Sudafed PE 10 mg every 4 hours as needed. Saline nasal spray used frequently. For drainage may use Allegra, Claritin or Zyrtec. If you need stronger medicine to stop drainage may take Chlor-Trimeton 2-4 mg every 4 hours. This may cause drowsiness. Ibuprofen 800 mg every 8 hours as needed for pain, discomfort or fever. Drink plenty of fluids and stay well-hydrated.  Most upper respiratory infections (URIs) are a viral infection of the air passages leading to the lungs. A URI affects the nose, throat, and upper air passages. The most common type of URI is nasopharyngitis and is typically referred to as "the common cold." URIs run their course and usually go away on their own. Most of the time, a URI does not require medical attention, but sometimes a bacterial infection in the upper airways can follow a viral infection. This is called a secondary infection. Sinus and middle ear infections are common types of secondary upper respiratory infections. Bacterial pneumonia can also complicate a URI. A URI can worsen asthma and chronic obstructive pulmonary disease (COPD). Sometimes, these complications can require emergency medical care and may be life threatening.  CAUSES Almost all URIs are caused by viruses. A virus is a type of germ and can spread from one person to another.  RISKS FACTORS You may be at risk for a URI if:   You smoke.   You have chronic heart or lung disease.  You have a weakened defense (immune) system.   You are very young or very old.   You have nasal allergies or asthma.  You work in crowded or poorly ventilated areas.  You work in health care facilities or schools. SIGNS AND SYMPTOMS  Symptoms typically develop 2-3 days after you come in contact with a cold virus. Most viral URIs last 7-10 days. However, viral URIs from the influenza virus (flu virus) can last 14-18 days and are typically more  severe. Symptoms may include:   Runny or stuffy (congested) nose.   Sneezing.   Cough.   Sore throat.   Headache.   Fatigue.   Fever.   Loss of appetite.   Pain in your forehead, behind your eyes, and over your cheekbones (sinus pain).  Muscle aches.  DIAGNOSIS  Your health care provider may diagnose a URI by:  Physical exam.  Tests to check that your symptoms are not due to another condition such as:  Strep throat.  Sinusitis.  Pneumonia.  Asthma. TREATMENT  A URI goes away on its own with time. It cannot be cured with medicines, but medicines may be prescribed or recommended to relieve symptoms. Medicines may help:  Reduce your fever.  Reduce your cough.  Relieve nasal congestion. HOME CARE INSTRUCTIONS   Take medicines only as directed by your health care provider.   Gargle warm saltwater or take cough drops to comfort your throat as directed by your health care provider.  Use a warm mist humidifier or inhale steam from a shower to increase air moisture. This may make it easier to breathe.  Drink enough fluid to keep your urine clear or pale yellow.   Eat soups and other clear broths and maintain good nutrition.   Rest as needed.   Return to work when your temperature has returned to normal or as your health care provider advises. You may need to stay home longer to avoid infecting others. You can also use a face mask and careful hand washing  to prevent spread of the virus.  Increase the usage of your inhaler if you have asthma.   Do not use any tobacco products, including cigarettes, chewing tobacco, or electronic cigarettes. If you need help quitting, ask your health care provider. PREVENTION  The best way to protect yourself from getting a cold is to practice good hygiene.   Avoid oral or hand contact with people with cold symptoms.   Wash your hands often if contact occurs.  There is no clear evidence that vitamin C, vitamin  E, echinacea, or exercise reduces the chance of developing a cold. However, it is always recommended to get plenty of rest, exercise, and practice good nutrition.  SEEK MEDICAL CARE IF:   You are getting worse rather than better.   Your symptoms are not controlled by medicine.   You have chills.  You have worsening shortness of breath.  You have brown or red mucus.  You have yellow or brown nasal discharge.  You have pain in your face, especially when you bend forward.  You have a fever.  You have swollen neck glands.  You have pain while swallowing.  You have white areas in the back of your throat. SEEK IMMEDIATE MEDICAL CARE IF:   You have severe or persistent:  Headache.  Ear pain.  Sinus pain.  Chest pain.  You have chronic lung disease and any of the following:  Wheezing.  Prolonged cough.  Coughing up blood.  A change in your usual mucus.  You have a stiff neck.  You have changes in your:  Vision.  Hearing.  Thinking.  Mood. MAKE SURE YOU:   Understand these instructions.  Will watch your condition.  Will get help right away if you are not doing well or get worse.   This information is not intended to replace advice given to you by your health care provider. Make sure you discuss any questions you have with your health care provider.   Document Released: 12/22/2000 Document Revised: 11/12/2014 Document Reviewed: 10/03/2013 Elsevier Interactive Patient Education [employer].

## 2015-11-28 NOTE — ED Notes (Signed)
C/o cold sx onset yest associated w/HA, cough, congestion, BA and CP  Denies SOB/dyspnea, weakness A&O x4... No acute distress.

## 2015-12-09 ENCOUNTER — Ambulatory Visit (HOSPITAL_COMMUNITY)
Admission: EM | Admit: 2015-12-09 | Discharge: 2015-12-09 | Disposition: A | Discharge status: Home / Self Care | Attending: Family Medicine | Admitting: Family Medicine

## 2015-12-09 ENCOUNTER — Encounter (HOSPITAL_COMMUNITY): Payer: Self-pay | Admitting: Emergency Medicine

## 2015-12-09 DIAGNOSIS — N898 Other specified noninflammatory disorders of vagina: Secondary | ICD-10-CM | POA: Insufficient documentation

## 2015-12-09 DIAGNOSIS — R109 Unspecified abdominal pain: Secondary | ICD-10-CM | POA: Diagnosis not present

## 2015-12-09 LAB — POCT PREGNANCY, URINE: Preg Test, Ur: NEGATIVE

## 2015-12-09 LAB — POCT URINALYSIS DIP (DEVICE)
Bilirubin Urine: NEGATIVE
GLUCOSE, UA: NEGATIVE mg/dL
KETONES UR: NEGATIVE mg/dL
Nitrite: NEGATIVE
PROTEIN: NEGATIVE mg/dL
SPECIFIC GRAVITY, URINE: 1.02 (ref 1.005–1.030)
Urobilinogen, UA: 0.2 mg/dL (ref 0.0–1.0)
pH: 6.5 (ref 5.0–8.0)

## 2015-12-09 MED ORDER — AZITHROMYCIN 250 MG PO TABS
ORAL_TABLET | ORAL | Status: AC
  Filled 2015-12-09: qty 4

## 2015-12-09 MED ORDER — CEFTRIAXONE SODIUM 250 MG IJ SOLR
INTRAMUSCULAR | Status: AC
  Filled 2015-12-09: qty 250

## 2015-12-09 MED ORDER — STERILE WATER FOR INJECTION IJ SOLN
INTRAMUSCULAR | Status: AC
  Filled 2015-12-09: qty 10

## 2015-12-09 MED ORDER — AZITHROMYCIN 250 MG PO TABS
1000.0000 mg | ORAL_TABLET | Freq: Once | ORAL | Status: AC
Start: 2015-12-09 — End: 2015-12-09
  Administered 2015-12-09: 1000 mg via ORAL

## 2015-12-09 MED ORDER — CEFTRIAXONE SODIUM 250 MG IJ SOLR
250.0000 mg | Freq: Once | INTRAMUSCULAR | Status: AC
  Administered 2015-12-09: 250 mg via INTRAMUSCULAR

## 2015-12-09 NOTE — Discharge Instructions (Signed)
Flank Pain Flank pain is pain in your side. The flank is the area of your side between your upper belly (abdomen) and your back. Pain in this area can be caused by many different things. HOME CARE Home care and treatment will depend on the cause of your pain.  Rest as told by your doctor.  Drink enough fluids to keep your pee (urine) clear or pale yellow.  Only take medicine as told by your doctor.  Tell your doctor about any changes in your pain.  Follow up with your doctor. GET HELP RIGHT AWAY IF:   Your pain does not get better with medicine.   You have new symptoms or your symptoms get worse.  Your pain gets worse.   You have belly (abdominal) pain.   You are short of breath.   You always feel sick to your stomach (nauseous).   You keep throwing up (vomiting).   You have puffiness (swelling) in your belly.   You feel light-headed or you pass out (faint).   You have blood in your pee.  You have a fever or lasting symptoms for more than 2-3 days.  You have a fever and your symptoms suddenly get worse. MAKE SURE YOU:   Understand these instructions.  Will watch your condition.  Will get help right away if you are not doing well or get worse.   This information is not intended to replace advice given to you by your health care provider. Make sure you discuss any questions you have with your health care provider.   Document Released: 04/06/2008 Document Revised: 07/19/2014 Document Reviewed: 02/10/2012 Elsevier Interactive Patient Education 2016 Elsevier Inc.  Abdominal Pain, Adult Many things can cause abdominal pain. Usually, abdominal pain is not caused by a disease and will improve without treatment. It can often be observed and treated at home. Your health care provider will do a physical exam and possibly order blood tests and X-rays to help determine the seriousness of your pain. However, in many cases, more time must pass before a clear cause of  the pain can be found. Before that point, your health care provider may not know if you need more testing or further treatment. HOME CARE INSTRUCTIONS Monitor your abdominal pain for any changes. The following actions may help to alleviate any discomfort you are experiencing:  Only take over-the-counter or prescription medicines as directed by your health care provider.  Do not take laxatives unless directed to do so by your health care provider.  Try a clear liquid diet (broth, tea, or water) as directed by your health care provider. Slowly move to a bland diet as tolerated. SEEK MEDICAL CARE IF:  You have unexplained abdominal pain.  You have abdominal pain associated with nausea or diarrhea.  You have pain when you urinate or have a bowel movement.  You experience abdominal pain that wakes you in the night.  You have abdominal pain that is worsened or improved by eating food.  You have abdominal pain that is worsened with eating fatty foods.  You have a fever. SEEK IMMEDIATE MEDICAL CARE IF:  Your pain does not go away within 2 hours.  You keep throwing up (vomiting).  Your pain is felt only in portions of the abdomen, such as the right side or the left lower portion of the abdomen.  You pass bloody or black tarry stools. MAKE SURE YOU:  Understand these instructions.  Will watch your condition.  Will get help right away if  you are not doing well or get worse.   This information is not intended to replace advice given to you by your health care provider. Make sure you discuss any questions you have with your health care provider.   Document Released: 04/07/2005 Document Revised: 03/19/2015 Document Reviewed: 03/07/2013 Elsevier Interactive Patient Education [employer]

## 2015-12-09 NOTE — ED Notes (Signed)
PT reports her period ended Sunday and yesterday she began having lower abdominal pain and vaginal discharge. PT reports her discharge is "liquidy" and abundant. PT denies foul odor.

## 2015-12-09 NOTE — ED Notes (Signed)
PT discharged by Frank Patrick, PA 

## 2015-12-10 LAB — URINE CYTOLOGY ANCILLARY ONLY
CHLAMYDIA, DNA PROBE: NEGATIVE
NEISSERIA GONORRHEA: POSITIVE — AB
Trichomonas: NEGATIVE

## 2015-12-10 LAB — HIV ANTIBODY (ROUTINE TESTING W REFLEX): HIV SCREEN 4TH GENERATION: NONREACTIVE

## 2015-12-11 LAB — URINE CYTOLOGY ANCILLARY ONLY: BACTERIAL VAGINITIS: POSITIVE — AB

## 2015-12-11 NOTE — ED Provider Notes (Signed)
CSN: [id number]     Arrival date & time 12/09/15  1930 History   First MD Initiated Contact with Patient 12/09/15 2000     Chief Complaint  Patient presents with  . Vaginal Discharge   (Consider location/radiation/quality/duration/timing/severity/associated sxs/prior Treatment) HPI History obtained from patient:  Pt presents with the cc of:  Abdominal cramps Duration of symptoms: a few days Treatment prior to arrival: none Context: onset of cramps Other symptoms include: liquid like discharge Pain score: 2  FAMILY HISTORY: mother with hypertension    Past Medical History  Diagnosis Date  . Obesity    History reviewed. No pertinent past surgical history. Family History  Problem Relation Age of Onset  . Hypertension Mother 84  . Anxiety disorder Mother   . Hyperlipidemia Father 38  . Stroke Maternal Grandmother   . Anxiety disorder Sister    Social History  Substance Use Topics  . Smoking status: Never Smoker   . Smokeless tobacco: Never Used  . Alcohol Use: No   OB History    Gravida Para Term Preterm AB TAB SAB Ectopic Multiple Living   Review of Systems  Allergies  Review of patient's allergies indicates no known allergies.  Home Medications   Prior to Admission medications   Medication Sig Start Date End Date Taking? Authorizing Provider  acetaminophen (TYLENOL) 500 MG tablet Take 1 tablet (500 mg total) by mouth every 6 (six) hours as needed. 05/21/15   [name], PA-C  albuterol (PROVENTIL HFA;VENTOLIN HFA) 108 (90 BASE) MCG/ACT inhaler Inhale 2 puffs into the lungs every 6 (six) hours as needed for wheezing or shortness of breath. 12/23/14   Rodolph Bong, MD  ibuprofen (ADVIL,MOTRIN) 800 MG tablet Take 1 tablet (800 mg total) by mouth 3 (three) times daily. Prn pain and discomfort. 11/28/15   Hayden Rasmussen, NP  ipratropium (ATROVENT) 0.06 % nasal spray Place 2 sprays into both nostrils 4 (four) times daily. 12/21/14   Rodolph Bong, MD   loratadine (CLARITIN) 10 MG tablet Take 1 tablet (10 mg total) by mouth daily. 03/23/15   Hope Orlene Och, NP  naproxen (NAPROSYN) 500 MG tablet Take 1 tablet (500 mg total) by mouth 2 (two) times daily. 03/23/15   Hope Orlene Och, NP  ondansetron (ZOFRAN) 4 MG tablet Take 1 tablet (4 mg total) by mouth every 8 (eight) hours as needed for nausea. May cause constipation 10/18/15   Hayden Rasmussen, NP   Meds Ordered and Administered this Visit   Medications  azithromycin [facility]) tablet 1,000 mg (1,000 mg Oral Given 12/09/15 2037)  cefTRIAXone (ROCEPHIN) injection 250 mg (250 mg Intramuscular Given 12/09/15 2039)    BP 149/85 mmHg  Pulse 66  Temp(Src) 98.4 F (36.9 C) (Oral)  Resp 16  SpO2 100%  LMP 11/29/2015 No data found.   Physical Exam  ED Course  Procedures (including critical care time)  Labs Review Labs Reviewed  POCT URINALYSIS DIP (DEVICE) - Abnormal; Notable for the following:    Hgb urine dipstick TRACE (*)    Leukocytes, UA SMALL (*)    All other components within normal limits  URINE CYTOLOGY ANCILLARY ONLY - Abnormal; Notable for the following:    Neisseria gonorrhea **POSITIVE** (*)    All other components within normal limits  HIV ANTIBODY (ROUTINE TESTING)  POCT PREGNANCY, URINE    Imaging Review No results found.   Visual Acuity Review  Right Eye  Distance:   Left Eye Distance:   Bilateral Distance:    Right Eye Near:   Left Eye Near:    Bilateral Near:       Total Visit Time: 20 MINUTES "GREATER THAN 50% WAS SPENT IN COUNSELING AND COORDINATION OF CARE WITH THE PATIENT" DISCUSSION OF SAFE SEX, STD TREATMENT WAS PROVIDED, OPTIONS PROVIDED TO PATIENT INCLUDED TREAT EMPIRICALLY OR AWAIT RETURN OF CULTURES. PT WOULD PREFER TREATMENT NOW.    MDM   1. Abdominal cramps     Patient is reassured that there are no issues that require transfer to higher level of care at this time or additional tests. Patient is advised to continue home symptomatic  treatment. Patient is advised that if there are new or worsening symptoms to attend the emergency department, contact primary care provider, or return to UC. Instructions of care provided discharged home in stable condition.    THIS NOTE WAS GENERATED USING A VOICE RECOGNITION SOFTWARE PROGRAM. ALL REASONABLE EFFORTS  WERE MADE TO PROOFREAD THIS DOCUMENT FOR ACCURACY.  I have verbally reviewed the discharge instructions with the patient. A printed AVS was given to the patient.  All questions were answered prior to discharge. '     Tharon AquasFrank C Patrick, [place] 12/11/15 1315

## 2015-12-16 ENCOUNTER — Telehealth (HOSPITAL_COMMUNITY): Payer: Self-pay | Admitting: Emergency Medicine

## 2015-12-16 NOTE — ED Notes (Signed)
Called pt and notified of recent lab results from visit 5/30 Pt ID'd properly... Reports feeling better and sx have subsided along w/vag d/c   Per Dr. Dayton ScrapeMurray,  Notes Recorded by Charm RingsErin J Honig, MD on 12/12/2015 at 3:45 PM Please notify patient of positive gonorrhea. Other STD testing is negative. She was adequately [facility] Testing is also positive for BV. If she continues to have discharge, please call in Flagyl 500mg  BID x7 days to pharmacy of choice. If her symptoms have resolved, she does not need treatment for BV.  Adv pt if sx are not getting better to return  Pt verb understanding Education on safe sex given [facility]

## 2016-01-22 ENCOUNTER — Ambulatory Visit (HOSPITAL_COMMUNITY)
Admission: EM | Admit: 2016-01-22 | Discharge: 2016-01-22 | Disposition: A | Discharge status: Home / Self Care | Attending: Family Medicine | Admitting: Family Medicine

## 2016-01-22 ENCOUNTER — Encounter (HOSPITAL_COMMUNITY): Payer: Self-pay | Admitting: Emergency Medicine

## 2016-01-22 DIAGNOSIS — S39012A Strain of muscle, fascia and tendon of lower back, initial encounter: Secondary | ICD-10-CM | POA: Diagnosis not present

## 2016-01-22 MED ORDER — DICLOFENAC POTASSIUM 50 MG PO TABS: 50.0000 mg | ORAL_TABLET | Freq: Three times a day (TID) | ORAL | Status: DC

## 2016-01-22 MED ORDER — CYCLOBENZAPRINE HCL 5 MG PO TABS: 5.0000 mg | ORAL_TABLET | Freq: Three times a day (TID) | ORAL | Status: DC

## 2016-01-22 NOTE — ED Notes (Signed)
PT reports right side pain and back pain. Back pain has been present for months. PT has been taking a coworkers oxycodone for pain. PT reports ibuprofen is not helping.

## 2016-01-22 NOTE — ED Provider Notes (Signed)
CSN: [id number]     Arrival date & time 01/22/16  1927 History   First MD Initiated Contact with Patient 01/22/16 2023     Chief Complaint  Patient presents with  . Back Pain   (Consider location/radiation/quality/duration/timing/severity/associated sxs/prior Treatment) Patient is a 20 y.o. female presenting with back pain. The history is provided by the patient.  Back Pain Location:  Lumbar spine Quality:  Stiffness Radiates to:  Does not radiate Pain severity:  Mild Onset quality:  Gradual Duration:  1 week Progression:  Waxing and waning Chronicity:  New Context: physical stress   Context comment:  Stands on job Associated symptoms: no abdominal pain, no chest pain, no dysuria, no fever, no leg pain, no numbness, no paresthesias, no pelvic pain, no perianal numbness and no tingling   Risk factors: lack of exercise     Past Medical History  Diagnosis Date  . Obesity    History reviewed. No pertinent past surgical history. Family History  Problem Relation Age of Onset  . Hypertension Mother [id number]  . Anxiety disorder Mother   . Hyperlipidemia Father 6142  . Stroke Maternal Grandmother   . Anxiety disorder Sister    Social History  Substance Use Topics  . Smoking status: Never Smoker   . Smokeless tobacco: Never Used  . Alcohol Use: No   OB History    Gravida Para Term Preterm AB TAB SAB Ectopic Multiple Living   1    1 1          Review of Systems  Constitutional: Negative.  Negative for fever.  Cardiovascular: Negative for chest pain.  Gastrointestinal: Negative.  Negative for nausea, vomiting and abdominal pain.  Genitourinary: Negative for dysuria, flank pain, menstrual problem and pelvic pain.  Musculoskeletal: Positive for back pain. Negative for myalgias, joint swelling and gait problem.  Skin: Negative.   Neurological: Negative for tingling, numbness and paresthesias.  All other systems reviewed and are negative.   Allergies  Review of patient's allergies  indicates no known allergies.  Home Medications   Prior to Admission medications   Medication Sig Start Date End Date Taking? Authorizing Provider  acetaminophen (TYLENOL) 500 MG tablet Take 1 tablet (500 mg total) by mouth every 6 (six) hours as needed. 05/21/15   Cheri FowlerKayla Rose, PA-C  albuterol (PROVENTIL HFA;VENTOLIN HFA) 108 (90 BASE) MCG/ACT inhaler Inhale 2 puffs into the lungs every 6 (six) hours as needed for wheezing or shortness of breath. 12/23/14   Rodolph BongEvan S Corey, MD  cyclobenzaprine (FLEXERIL) 5 MG tablet Take 1 tablet (5 mg total) by mouth 3 (three) times daily. As muscle relaxer 01/22/16   Linna HoffJames D Kindl, MD  diclofenac (CATAFLAM) 50 MG tablet Take 1 tablet (50 mg total) by mouth 3 (three) times daily. 01/22/16   Linna HoffJames D Kindl, MD  ibuprofen (ADVIL,MOTRIN) 800 MG tablet Take 1 tablet (800 mg total) by mouth 3 (three) times daily. Prn pain and discomfort. 11/28/15   Hayden Rasmussenavid Mabe, NP  ipratropium (ATROVENT) 0.06 % nasal spray Place 2 sprays into both nostrils 4 (four) times daily. 12/21/14   Rodolph BongEvan S Corey, MD  loratadine (CLARITIN) 10 MG tablet Take 1 tablet (10 mg total) by mouth daily. 03/23/15   Hope Orlene OchM Neese, NP  naproxen (NAPROSYN) 500 MG tablet Take 1 tablet (500 mg total) by mouth 2 (two) times daily. 03/23/15   Hope Orlene OchM Neese, NP  ondansetron (ZOFRAN) 4 MG tablet Take 1 tablet (4 mg total) by mouth every 8 (eight) hours as needed  for nausea. May cause constipation 10/18/15   Hayden Rasmussen, NP   Meds Ordered and Administered this Visit  Medications - No data to display  BP 141/89 mmHg  Pulse 95  Temp(Src) 99 F (37.2 C) (Oral)  Resp 18  SpO2 100%  LMP 01/20/2016 No data found.   Physical Exam  Constitutional: She is oriented to person, place, and time. She appears well-developed and well-nourished. No distress.  Abdominal: Soft. Bowel sounds are normal.  Musculoskeletal:       Lumbar back: She exhibits decreased range of motion, tenderness, pain and spasm. She exhibits no bony tenderness,  no swelling, no deformity and normal pulse.  Neurological: She is alert and oriented to person, place, and time. No cranial nerve deficit. Coordination normal.  Skin: Skin is warm and dry.  Nursing note and vitals reviewed.   ED Course  Procedures (including critical care time)  Labs Review Labs Reviewed - No data to display  Imaging Review No results found.   Visual Acuity Review  Right Eye Distance:   Left Eye Distance:   Bilateral Distance:    Right Eye Near:   Left Eye Near:    Bilateral Near:         MDM   1. Low back strain, initial encounter        Linna Hoff, MD 01/22/16 2046

## 2016-02-20 ENCOUNTER — Encounter (HOSPITAL_COMMUNITY): Payer: Self-pay | Admitting: Emergency Medicine

## 2016-02-20 ENCOUNTER — Ambulatory Visit (HOSPITAL_COMMUNITY)
Admission: EM | Admit: 2016-02-20 | Discharge: 2016-02-20 | Disposition: A | Discharge status: Home / Self Care | Attending: Family Medicine | Admitting: Family Medicine

## 2016-02-20 DIAGNOSIS — K5901 Slow transit constipation: Secondary | ICD-10-CM | POA: Diagnosis not present

## 2016-02-20 MED ORDER — POLYETHYLENE GLYCOL 3350 17 GM/SCOOP PO POWD: 17.0000 g | Freq: Two times a day (BID) | ORAL | 0 refills | Status: DC | PRN

## 2016-02-20 NOTE — ED Provider Notes (Signed)
[facility]    CSN: [id number] Arrival date & time: 02/20/16  1436  First Provider Contact:  First MD Initiated Contact with Patient 02/20/16 1443     History   Chief Complaint Chief Complaint  Patient presents with  . Dizziness  . Constipation   HPI Kelsey Carroll is a 20 y.o. female presenting for constipation.   She reports generally having BMs that are brown and solid once per month for several years. She notes having nausea since yesterday evening without emesis, improved with gingerale and having the urge to defecate without being able to. Today she could not make it to her second job because of the nausea which was associated with light-headedness that subsided within 1 minute with sitting and drinking gingerale. She's been taking good po otherwise. Denies ear fullness, rotational dizziness, change in hearing/vision, headache, fever, abd pain, dysuria, blood in stool. LBM today.    Past Medical History:  Diagnosis Date  . Obesity     Patient Active Problem List   Diagnosis Date Noted  . Depression 01/14/2013  . Hydradenitis 02/29/2012  . Impaired hearing 02/29/2012  . Obesity (BMI 35.0-39.9 without comorbidity) (HCC) 02/29/2012    History reviewed. No pertinent surgical history.  OB History    Gravida Para Term Preterm AB Living   1       1     SAB TAB Ectopic Multiple Live Births     1             Home Medications    Prior to Admission medications   Medication Sig Start Date End Date Taking? Authorizing Provider  acetaminophen (TYLENOL) 500 MG tablet Take 1 tablet (500 mg total) by mouth every 6 (six) hours as needed. 05/21/15   Cheri FowlerKayla Rose, PA-C  albuterol (PROVENTIL HFA;VENTOLIN HFA) 108 (90 BASE) MCG/ACT inhaler Inhale 2 puffs into the lungs every 6 (six) hours as needed for wheezing or shortness of breath. 12/23/14   Rodolph BongEvan S Corey, MD  cyclobenzaprine (FLEXERIL) 5 MG tablet Take 1 tablet (5 mg total) by mouth 3 (three) times daily. As muscle  relaxer 01/22/16   Linna HoffJames D Kindl, MD  diclofenac (CATAFLAM) 50 MG tablet Take 1 tablet (50 mg total) by mouth 3 (three) times daily. 01/22/16   Linna HoffJames D Kindl, MD  ibuprofen (ADVIL,MOTRIN) 800 MG tablet Take 1 tablet (800 mg total) by mouth 3 (three) times daily. Prn pain and discomfort. 11/28/15   Hayden Rasmussenavid Mabe, NP  ipratropium (ATROVENT) 0.06 % nasal spray Place 2 sprays into both nostrils 4 (four) times daily. 12/21/14   Rodolph BongEvan S Corey, MD  loratadine (CLARITIN) 10 MG tablet Take 1 tablet (10 mg total) by mouth daily. 03/23/15   Hope Orlene OchM Neese, NP  naproxen (NAPROSYN) 500 MG tablet Take 1 tablet (500 mg total) by mouth 2 (two) times daily. 03/23/15   Hope Orlene OchM Neese, NP  ondansetron (ZOFRAN) 4 MG tablet Take 1 tablet (4 mg total) by mouth every 8 (eight) hours as needed for nausea. May cause constipation 10/18/15   Hayden Rasmussenavid Mabe, NP  polyethylene glycol powder (GLYCOLAX/MIRALAX) powder Take 17 g by mouth 2 (two) times daily as needed for moderate constipation. Take as needed to produce 1 normal bowel movement per day. 02/20/16   Tyrone Nineyan B Grunz, MD    Family History Family History  Problem Relation Age of Onset  . Hypertension Mother 3748  . Anxiety disorder Mother   . Hyperlipidemia Father 6542  . Stroke Maternal Grandmother   .  Anxiety disorder Sister     Social History Social History  Substance Use Topics  . Smoking status: Never Smoker  . Smokeless tobacco: Never Used  . Alcohol use No   Allergies   Review of patient's allergies indicates no known allergies.  Review of Systems Review of Systems As above  Physical Exam Triage Vital Signs ED Triage Vitals [02/20/16 1452]  Enc Vitals Group     BP 127/74     Pulse Rate 73     Resp 20     Temp 98.2 F (36.8 C)     Temp Source Oral     SpO2 100 %     Weight      Height      Head Circumference      Peak Flow      Pain Score      Pain Loc      Pain Edu?      Excl. in GC?    No data found.   Updated Vital Signs BP 127/74 (BP Location: Right  Arm)   Pulse 73   Temp 98.2 F (36.8 C) (Oral)   Resp 20   LMP 02/15/2016   SpO2 100%   Physical Exam  Constitutional: She is oriented to person, place, and time. She appears well-developed and well-nourished. No distress.  Eyes: EOM are normal. Pupils are equal, round, and reactive to light. No scleral icterus.  Neck: Neck supple. No JVD present.  Cardiovascular: Normal rate, regular rhythm, normal heart sounds and intact distal pulses.   No murmur heard. Pulmonary/Chest: Effort normal and breath sounds normal. No respiratory distress.  Abdominal: Soft. Bowel sounds are normal. She exhibits no distension. There is no tenderness.  + stool palpated in LLQ  Musculoskeletal: Normal range of motion. She exhibits no edema or tenderness.  Lymphadenopathy:    She has no cervical adenopathy.  Neurological: She is alert and oriented to person, place, and time. She exhibits normal muscle tone.  Skin: Skin is warm and dry. Capillary refill takes less than 2 seconds.  Vitals reviewed.  UC Treatments / Results  Labs (all labs ordered are listed, but only abnormal results are displayed) Labs Reviewed - No data to display  EKG  EKG Interpretation None       Radiology No results found.  Procedures Procedures (including critical care time)  Medications Ordered in UC Medications - No data to display  Initial Impression / Assessment and Plan / UC Course  I have reviewed the triage vital signs and the nursing notes.  Pertinent labs & imaging results that were available during my care of the patient were reviewed by me and considered in my medical decision making (see chart for details).  Final Clinical Impressions(s) / UC Diagnoses   Final diagnoses:  Slow transit constipation   20 y.o. female presenting with chronic constipation and nausea. Hemodynamically stable.  - Rx miralax, increase fiber - f/u PCP if no improvement or sooner if abd pain/fever.   New Prescriptions New  Prescriptions   POLYETHYLENE GLYCOL POWDER (GLYCOLAX/MIRALAX) POWDER    Take 17 g by mouth 2 (two) times daily as needed for moderate constipation. Take as needed to produce 1 normal bowel movement per day.     Tyrone Nine, MD 02/20/16 1515

## 2016-02-20 NOTE — ED Triage Notes (Signed)
Here for dizziness and nausea onset last night... Also reports constipation onset x1 month... LBM = today... A&O x4... NAD

## 2016-03-05 ENCOUNTER — Ambulatory Visit: Admitting: Internal Medicine

## 2016-03-06 ENCOUNTER — Ambulatory Visit (HOSPITAL_COMMUNITY)
Admission: EM | Admit: 2016-03-06 | Discharge: 2016-03-06 | Disposition: A | Discharge status: Home / Self Care | Attending: Radiology | Admitting: Radiology

## 2016-03-06 ENCOUNTER — Encounter (HOSPITAL_COMMUNITY): Payer: Self-pay | Admitting: Emergency Medicine

## 2016-03-06 DIAGNOSIS — K5900 Constipation, unspecified: Secondary | ICD-10-CM

## 2016-03-06 MED ORDER — POLYETHYLENE GLYCOL 3350 17 GM/SCOOP PO POWD: 17.0000 g | Freq: Two times a day (BID) | ORAL | 0 refills | Status: DC | PRN

## 2016-03-06 NOTE — ED Notes (Signed)
D/c by Jennifer, NP 

## 2016-03-06 NOTE — ED Provider Notes (Signed)
CSN: [id number]     Arrival date & time 03/06/16  1902 History   First MD Initiated Contact with Patient 03/06/16 2008     Chief Complaint  Patient presents with  . Dizziness   (Consider location/radiation/quality/duration/timing/severity/associated sxs/prior Treatment) 20 y.o. female presents with  reoccurring constipation. Patient states that she seen her previously for the same and she recently ran out of her prescription and is requesting a refill.  Condition is acute in nature. Condition is made better by miralax. Condition is made worse by nothing.Patient also complains of right flank pain that she states is related to her constipation. Bowel sounds hyperactive. Pain with palpation to right flank. Patient denies any fevers, pain with urination, nausea or vomitting.       Past Medical History:  Diagnosis Date  . Obesity    History reviewed. No pertinent surgical history. Family History  Problem Relation Age of Onset  . Hypertension Mother 60  . Anxiety disorder Mother   . Hyperlipidemia Father 61  . Stroke Maternal Grandmother   . Anxiety disorder Sister    Social History  Substance Use Topics  . Smoking status: Never Smoker  . Smokeless tobacco: Never Used  . Alcohol use No   OB History    Gravida Para Term Preterm AB Living   1       1     SAB TAB Ectopic Multiple Live Births     1           Review of Systems  Allergies  Review of patient's allergies indicates no known allergies.  Home Medications   Prior to Admission medications   Medication Sig Start Date End Date Taking? Authorizing Provider  acetaminophen (TYLENOL) 500 MG tablet Take 1 tablet (500 mg total) by mouth every 6 (six) hours as needed. 05/21/15   [name], PA-C  albuterol (PROVENTIL HFA;VENTOLIN HFA) 108 (90 BASE) MCG/ACT inhaler Inhale 2 puffs into the lungs every 6 (six) hours as needed for wheezing or shortness of breath. 12/23/14   Rodolph Bong, MD  cyclobenzaprine (FLEXERIL) 5 MG tablet  Take 1 tablet (5 mg total) by mouth 3 (three) times daily. As muscle relaxer 01/22/16   Linna Hoff, MD  diclofenac (CATAFLAM) 50 MG tablet Take 1 tablet (50 mg total) by mouth 3 (three) times daily. 01/22/16   Linna Hoff, MD  doxycycline (VIBRAMYCIN) 100 MG capsule Take 1 capsule (100 mg total) by mouth 2 (two) times daily. 04/19/16   Hope Orlene Och, NP  HYDROcodone-acetaminophen (NORCO) 5-325 MG tablet Take 1 tablet by mouth every 6 (six) hours as needed. 04/19/16   Hope Orlene Och, NP  ibuprofen (ADVIL,MOTRIN) 800 MG tablet Take 1 tablet (800 mg total) by mouth 3 (three) times daily. Prn pain and discomfort. 11/28/15   Hayden Rasmussen, NP  ipratropium (ATROVENT) 0.06 % nasal spray Place 2 sprays into both nostrils 4 (four) times daily. 12/21/14   Rodolph Bong, MD  loratadine (CLARITIN) 10 MG tablet Take 1 tablet (10 mg total) by mouth daily. 03/23/15   Hope Orlene Och, NP  naproxen (NAPROSYN) 500 MG tablet Take 1 tablet (500 mg total) by mouth 2 (two) times daily. 04/19/16   Hope Orlene Och, NP  ondansetron (ZOFRAN) 4 MG tablet Take 1 tablet (4 mg total) by mouth every 8 (eight) hours as needed for nausea. May cause constipation 10/18/15   Hayden Rasmussen, NP  polyethylene glycol powder (GLYCOLAX/MIRALAX) powder Take 17 g by mouth 2 (two) times  daily as needed for moderate constipation. Take as needed to produce 1 normal bowel movement per day. 03/06/16   Alene MiresJennifer C Omohundro, NP   Meds Ordered and Administered this Visit  Medications - No data to display  BP 118/63 (BP Location: Left Arm)   Pulse 80   Temp 98.3 F (36.8 C) (Oral)   Resp 16   LMP 02/15/2016   SpO2 100%  No data found.   Physical Exam  Urgent Care Course   Clinical Course    Procedures (including critical care time)  Labs Review Labs Reviewed - No data to display  Imaging Review No results found.   Visual Acuity Review  Right Eye Distance:   Left Eye Distance:   Bilateral Distance:    Right Eye Near:   Left Eye Near:     Bilateral Near:         MDM   1. Constipation, unspecified constipation type       Alene MiresJennifer C Omohundro, NP 03/06/16 2049    Eustace MooreLaura W Kensy Blizard, MD 04/29/16 1539

## 2016-03-06 NOTE — Discharge Instructions (Signed)
Please follow up with the ED for severe abdominal pain. Continue to push fluids and diet high in fiber

## 2016-03-06 NOTE — ED Triage Notes (Signed)
Pt here for dizziness, nauseas and constipation onset x3 days  Reports she was seen here on 08/11 w/similar sx... meds given at visit helped a lot   Voices no other concerns.... A&O x4... NAD

## 2016-04-19 ENCOUNTER — Encounter (HOSPITAL_COMMUNITY): Payer: Self-pay

## 2016-04-19 ENCOUNTER — Emergency Department (HOSPITAL_COMMUNITY)
Admission: EM | Admit: 2016-04-19 | Discharge: 2016-04-19 | Disposition: A | Discharge status: Home / Self Care | Attending: Emergency Medicine | Admitting: Emergency Medicine

## 2016-04-19 DIAGNOSIS — L02411 Cutaneous abscess of right axilla: Secondary | ICD-10-CM | POA: Insufficient documentation

## 2016-04-19 MED ORDER — HYDROCODONE-ACETAMINOPHEN 5-325 MG PO TABS: 1.0000 | ORAL_TABLET | Freq: Four times a day (QID) | ORAL | 0 refills | Status: DC | PRN

## 2016-04-19 MED ORDER — NAPROXEN 500 MG PO TABS: 500.0000 mg | ORAL_TABLET | Freq: Two times a day (BID) | ORAL | 0 refills | Status: DC

## 2016-04-19 MED ORDER — IBUPROFEN 400 MG PO TABS
600.0000 mg | ORAL_TABLET | Freq: Once | ORAL | Status: AC
  Administered 2016-04-19: 600 mg via ORAL
  Filled 2016-04-19: qty 1

## 2016-04-19 MED ORDER — DOXYCYCLINE HYCLATE 100 MG PO CAPS: 100.0000 mg | ORAL_CAPSULE | Freq: Two times a day (BID) | ORAL | 0 refills | Status: DC

## 2016-04-19 NOTE — Discharge Instructions (Signed)
Call [facility] for follow up.  °

## 2016-04-19 NOTE — ED Notes (Signed)
Declined W/C at D/C and was escorted to lobby by RN. 

## 2016-04-19 NOTE — ED Provider Notes (Signed)
[facility]-EMERGENCY DEPT Provider Note   CSN: [id number] Arrival date & time: 04/19/16  1302  By signing my name below, I, Kelsey Carroll, attest that this documentation has been prepared under the direction and in the presence of Kerrie Buffalo, NP Electronically Signed: Soijett Carroll, ED Scribe. 04/19/16. 4:14 PM.   History   Chief Complaint Chief Complaint  Patient presents with  . Abscess    HPI  Kelsey Carroll is a 20 y.o. female who presents to the Emergency Department complaining of abscess to right axilla onset 1 week. Pt notes that she has had multiple abscesses in the past that she has had to have drained. Pt rates her right axilla abscess as 10/10 at this time. Pt is having associated symptoms of redness to right axilla. She notes that she has tried warm compresses/soaks without medications for the relief of her symptoms. She denies fever, chills, drainage, wound, rash, and any other symptoms. Denies allergies to medications. Denies PMHx of MRSA. Pt states that her LMP was approximately 2 weeks ago.     The history is provided by the patient. No language interpreter was used.  Abscess  Location:  Shoulder/arm Shoulder/arm abscess location:  R axilla Abscess quality: fluctuance, induration, painful and redness   Abscess quality: not draining and not weeping   Red streaking: no   Duration:  1 week Progression:  Unchanged Pain details:    Severity:  Moderate   Duration:  1 week   Timing:  Unable to specify   Progression:  Unchanged Chronicity:  Recurrent Context: not diabetes and not skin injury   Relieved by:  None tried Worsened by:  Nothing Ineffective treatments:  None tried Associated symptoms: no fever   Risk factors: no hx of MRSA     Past Medical History:  Diagnosis Date  . Obesity     Patient Active Problem List   Diagnosis Date Noted  . Depression 01/14/2013  . Hydradenitis 02/29/2012  . Impaired hearing 02/29/2012  . Obesity (BMI 35.0-39.9 without  comorbidity) 02/29/2012    History reviewed. No pertinent surgical history.  OB History    Gravida Para Term Preterm AB Living   1       1     SAB TAB Ectopic Multiple Live Births     1             Home Medications    Prior to Admission medications   Medication Sig Start Date End Date Taking? Authorizing Provider  acetaminophen (TYLENOL) 500 MG tablet Take 1 tablet (500 mg total) by mouth every 6 (six) hours as needed. 05/21/15   [name], PA-C  albuterol (PROVENTIL HFA;VENTOLIN HFA) 108 (90 BASE) MCG/ACT inhaler Inhale 2 puffs into the lungs every 6 (six) hours as needed for wheezing or shortness of breath. 12/23/14   Rodolph Bong, MD  cyclobenzaprine (FLEXERIL) 5 MG tablet Take 1 tablet (5 mg total) by mouth 3 (three) times daily. As muscle relaxer 01/22/16   Linna Hoff, MD  diclofenac (CATAFLAM) 50 MG tablet Take 1 tablet (50 mg total) by mouth 3 (three) times daily. 01/22/16   Linna Hoff, MD  doxycycline (VIBRAMYCIN) 100 MG capsule Take 1 capsule (100 mg total) by mouth 2 (two) times daily. 04/19/16   Chloee Tena Orlene Och, NP  HYDROcodone-acetaminophen (NORCO) 5-325 MG tablet Take 1 tablet by mouth every 6 (six) hours as needed. 04/19/16   Elyshia Kumagai Orlene Och, NP  ibuprofen (ADVIL,MOTRIN) 800 MG tablet Take 1 tablet (800  mg total) by mouth 3 (three) times daily. Prn pain and discomfort. 11/28/15   Hayden Rasmussen, NP  ipratropium (ATROVENT) 0.06 % nasal spray Place 2 sprays into both nostrils 4 (four) times daily. 12/21/14   Rodolph Bong, MD  loratadine (CLARITIN) 10 MG tablet Take 1 tablet (10 mg total) by mouth daily. 03/23/15   Valiant Dills Orlene Och, NP  naproxen (NAPROSYN) 500 MG tablet Take 1 tablet (500 mg total) by mouth 2 (two) times daily. 04/19/16   Lanai Conlee Orlene Och, NP  ondansetron (ZOFRAN) 4 MG tablet Take 1 tablet (4 mg total) by mouth every 8 (eight) hours as needed for nausea. May cause constipation 10/18/15   Hayden Rasmussen, NP  polyethylene glycol powder (GLYCOLAX/MIRALAX) powder Take 17 g by mouth 2 (two)  times daily as needed for moderate constipation. Take as needed to produce 1 normal bowel movement per day. 03/06/16   Alene Mires, NP    Family History Family History  Problem Relation Age of Onset  . Hypertension Mother 64  . Anxiety disorder Mother   . Hyperlipidemia Father 75  . Stroke Maternal Grandmother   . Anxiety disorder Sister     Social History Social History  Substance Use Topics  . Smoking status: Never Smoker  . Smokeless tobacco: Never Used  . Alcohol use No     Allergies   Review of patient's allergies indicates no known allergies.   Review of Systems Review of Systems  Constitutional: Negative for chills and fever.  Skin: Positive for color change. Negative for rash and wound.       Abscess to right axilla without drainage  all other systems negative   Physical Exam Updated Vital Signs BP 112/78 (BP Location: Right Arm)   Pulse 81   Temp 97.9 F (36.6 C) (Oral)   Resp 18   SpO2 100%   Physical Exam  Constitutional: She is oriented to person, place, and time. She appears well-developed and well-nourished. No distress.  HENT:  Head: Normocephalic and atraumatic.  Eyes: EOM are normal.  Neck: Neck supple.  Cardiovascular: Normal rate.   Pulmonary/Chest: Effort normal. No respiratory distress.  Abdominal: She exhibits no distension.  Musculoskeletal: Normal range of motion.  Neurological: She is alert and oriented to person, place, and time.  Skin: Skin is warm and dry.  3 cm raised, firm, tender area. Slightly increased warmth. No drainage.   Psychiatric: She has a normal mood and affect. Her behavior is normal.  Nursing note and vitals reviewed.    ED Treatments / Results  DIAGNOSTIC STUDIES: Oxygen Saturation is 100% on RA, nl by my interpretation.    COORDINATION OF CARE: 4:08 PM Discussed treatment plan with pt at bedside which includes doxycycline Rx, norco Rx, naprosyn Rx, and pt agreed to plan.  4:07 PM- Pt was offered  I&D of right axilla abscess to which she declined, requesting an abx Rx, pain medication Rx, and that she would apply warm compresses and follow up with a general surgeon PRN.   Procedures Procedures (including critical care time)  Medications Ordered in ED Medications  ibuprofen (ADVIL,MOTRIN) tablet 600 mg (600 mg Oral Given 04/19/16 1630)     Initial Impression / Assessment and Plan / ED Course  I have reviewed the triage vital signs and the nursing notes.   Clinical Course    Patient with skin abscess to right axilla. Incision and drainage not performed in the ED today due to pt declining I&D procedure and opting for  an abx Rx. Supportive care and return precautions discussed.  Pt sent home with norco Rx, naprosyn Rx, doxycycline Rx. Pt referred and informed to follow up with general surgeon PRN. The patient appears reasonably screened and/or stabilized for discharge and I doubt any other emergent medical condition requiring further screening, evaluation, or treatment in the ED prior to discharge.   Final Clinical Impressions(s) / ED Diagnoses   Final diagnoses:  Abscess of axilla, right    New Prescriptions Discharge Medication List as of 04/19/2016  4:11 PM    START taking these medications   Details  doxycycline (VIBRAMYCIN) 100 MG capsule Take 1 capsule (100 mg total) by mouth 2 (two) times daily., Starting Mon 04/19/2016, Print    HYDROcodone-acetaminophen (NORCO) 5-325 MG tablet Take 1 tablet by mouth every 6 (six) hours as needed., Starting Mon 04/19/2016, Print       I personally performed the services described in this documentation, which was scribed in my presence. The recorded information has been reviewed and is accurate.     [place] M Lizet Kel[place], NP 04/22/16 2117    Laurence Spatesachel Morgan Little, MD 04/29/16 [phone]

## 2016-04-19 NOTE — ED Triage Notes (Signed)
Patient complains of right axilla abscess x 1 week, hx of same. Pain without drainage. Swelling and redness noted

## 2016-05-25 ENCOUNTER — Ambulatory Visit (HOSPITAL_COMMUNITY)
Admission: EM | Admit: 2016-05-25 | Discharge: 2016-05-25 | Disposition: A | Discharge status: Home / Self Care | Attending: Family Medicine | Admitting: Family Medicine

## 2016-05-25 ENCOUNTER — Encounter (HOSPITAL_COMMUNITY): Payer: Self-pay | Admitting: Emergency Medicine

## 2016-05-25 DIAGNOSIS — Z202 Contact with and (suspected) exposure to infections with a predominantly sexual mode of transmission: Secondary | ICD-10-CM | POA: Diagnosis not present

## 2016-05-25 DIAGNOSIS — R112 Nausea with vomiting, unspecified: Secondary | ICD-10-CM | POA: Diagnosis not present

## 2016-05-25 DIAGNOSIS — Z7251 High risk heterosexual behavior: Secondary | ICD-10-CM | POA: Diagnosis not present

## 2016-05-25 MED ORDER — AZITHROMYCIN 250 MG PO TABS: 1000.0000 mg | ORAL_TABLET | Freq: Once | ORAL | Status: DC

## 2016-05-25 MED ORDER — CEFTRIAXONE SODIUM 250 MG IJ SOLR: 250.0000 mg | Freq: Once | INTRAMUSCULAR | Status: DC

## 2016-05-25 NOTE — ED Provider Notes (Signed)
CSN: [id number]     Arrival date & time 05/25/16  [id number] History   First MD Initiated Contact with Patient 05/25/16 1919     Chief Complaint  Patient presents with  . Nausea   (Consider location/radiation/quality/duration/timing/severity/associated sxs/prior Treatment) 20 year old female states that she feels something "down there" to makes her believe that she may have an STD. She is not able to explain any further. She refuses to have a pelvic exam because she is on her period. She states she is having relatively heavy flow. She states last time she was here she discuss same problem with the provider and decided just to go ahead and get the shot. Patient has a history of high risk sexual behavior and various positive STD tests.  Patient also states that she wants a note for work because she had vomiting this morning. She feels better now.      Past Medical History:  Diagnosis Date  . Obesity    History reviewed. No pertinent surgical history. Family History  Problem Relation Age of Onset  . Hypertension Mother [id number]  . Anxiety disorder Mother   . Hyperlipidemia Father 5542  . Stroke Maternal Grandmother   . Anxiety disorder Sister    Social History  Substance Use Topics  . Smoking status: Never Smoker  . Smokeless tobacco: Never Used  . Alcohol use Yes   OB History    Gravida Para Term Preterm AB Living   1       1     SAB TAB Ectopic Multiple Live Births     1           Review of Systems  Constitutional: Negative.   HENT: Negative.   Respiratory: Negative.   Gastrointestinal: Positive for nausea and vomiting. Negative for rectal pain.       Nausea and vomiting was this morning 3. She states those symptoms have abated.  Genitourinary: Positive for vaginal bleeding and vaginal discharge.       Patient states that if she has a discharge that runs down her leg she has an STD. If she has a discharge that does not run down her leg is not an STD. Other than that she is unable  to describe specific symptoms that would indicate 1 problem versus the other.  Skin: Negative.   Neurological: Negative.     Allergies  Patient has no known allergies.  Home Medications   Prior to Admission medications   Medication Sig Start Date End Date Taking? Authorizing Provider  acetaminophen (TYLENOL) 500 MG tablet Take 1 tablet (500 mg total) by mouth every 6 (six) hours as needed. 05/21/15   Cheri FowlerKayla Rose, PA-C  albuterol (PROVENTIL HFA;VENTOLIN HFA) 108 (90 BASE) MCG/ACT inhaler Inhale 2 puffs into the lungs every 6 (six) hours as needed for wheezing or shortness of breath. 12/23/14   Rodolph BongEvan S Corey, MD  cyclobenzaprine (FLEXERIL) 5 MG tablet Take 1 tablet (5 mg total) by mouth 3 (three) times daily. As muscle relaxer 01/22/16   Linna HoffJames D Kindl, MD  diclofenac (CATAFLAM) 50 MG tablet Take 1 tablet (50 mg total) by mouth 3 (three) times daily. 01/22/16   Linna HoffJames D Kindl, MD  doxycycline (VIBRAMYCIN) 100 MG capsule Take 1 capsule (100 mg total) by mouth 2 (two) times daily. 04/19/16   Hope Orlene OchM Neese, NP  HYDROcodone-acetaminophen (NORCO) 5-325 MG tablet Take 1 tablet by mouth every 6 (six) hours as needed. 04/19/16   Hope Orlene OchM Neese, NP  ibuprofen (ADVIL,MOTRIN) 800 MG  tablet Take 1 tablet (800 mg total) by mouth 3 (three) times daily. Prn pain and discomfort. 11/28/15   Hayden Rasmussenavid Theran Vandergrift, NP  ipratropium (ATROVENT) 0.06 % nasal spray Place 2 sprays into both nostrils 4 (four) times daily. 12/21/14   Rodolph BongEvan S Corey, MD  loratadine (CLARITIN) 10 MG tablet Take 1 tablet (10 mg total) by mouth daily. 03/23/15   Hope Orlene OchM Neese, NP  naproxen (NAPROSYN) 500 MG tablet Take 1 tablet (500 mg total) by mouth 2 (two) times daily. 04/19/16   Hope Orlene OchM Neese, NP  ondansetron (ZOFRAN) 4 MG tablet Take 1 tablet (4 mg total) by mouth every 8 (eight) hours as needed for nausea. May cause constipation 10/18/15   Hayden Rasmussenavid Ephraim Reichel, NP  polyethylene glycol powder (GLYCOLAX/MIRALAX) powder Take 17 g by mouth 2 (two) times daily as needed for moderate  constipation. Take as needed to produce 1 normal bowel movement per day. 03/06/16   Alene MiresJennifer C Omohundro, NP   Meds Ordered and Administered this Visit   Medications  cefTRIAXone (ROCEPHIN) injection 250 mg (not administered)  azithromycin (ZITHROMAX) tablet 1,000 mg (not administered)    BP 103/58 (BP Location: Left Arm)   Pulse 90   Temp 98.5 F (36.9 C)   Resp 16   LMP 05/25/2016   SpO2 100%  No data found.   Physical Exam  Constitutional: She is oriented to person, place, and time. She appears well-developed and well-nourished. No distress.  Eyes: EOM are normal.  Neck: Normal range of motion. Neck supple.  Cardiovascular: Normal rate.   Pulmonary/Chest: Effort normal. No respiratory distress.  Musculoskeletal: She exhibits no edema.  Neurological: She is alert and oriented to person, place, and time. She exhibits normal muscle tone.  Skin: Skin is warm and dry.  Psychiatric: She has a normal mood and affect.  Nursing note and vitals reviewed.   Urgent Care Course   Clinical Course     Procedures (including critical care time)  Labs Review Labs Reviewed - No data to display  Imaging Review No results found.   Visual Acuity Review  Right Eye Distance:   Left Eye Distance:   Bilateral Distance:    Right Eye Near:   Left Eye Near:    Bilateral Near:         MDM   1. Exposure to sexually transmitted disease (STD)   2. High risk sexual behavior   3. Non-intractable vomiting with nausea, unspecified vomiting type   Patient declined to have pelvic exam or submit urine specimen. She insisted that I follow her orders and give her the medications that she requested. She stated that since she was allowed to not have to have a pelvic last time and to be given medications that she requested that he should be done that way every time for her. The patient was removed and disrespectful.    Please read your instructions. You may also follow and go to the health  department for STD screening and treatment free of charge. [facility] is another place that she can go for STD testing and treatment. It is very important that you obtain a primary care provide to take care of your health care needs who also will be able to test can manage your STDs. Meds ordered this encounter  Medications  . cefTRIAXone (ROCEPHIN) injection 250 mg  . azithromycin (ZITHROMAX) tablet 1,000 mg      Hayden Rasmussenavid Maddox Hlavaty, NP 05/25/16 1953

## 2016-05-25 NOTE — ED Triage Notes (Signed)
Patient reports nausea and vomiting this am.  Patient says she went back to sleep.  Patient no longer having nausea or vomiting and requesting a work note.    While here, patient wants to be checked for std.  Patient reports she wants to provide a urine specimen.  Patient has her period, and "not comfortable with a pelvic exam"  Patient say she never has to do a pelvic exam at this location.

## 2016-05-25 NOTE — ED Notes (Signed)
Did not obtain any specimens prior to patient talking with provider since patient refusing to follow protocol.

## 2016-05-25 NOTE — Discharge Instructions (Signed)
Please read your instructions. You may also follow and go to the health department for STD screening and treatment free of charge. [facility] is another place that she can go for STD testing and treatment. It is very important that you obtain a primary care provide to take care of your health care needs who also will be able to test can manage your STDs.

## 2016-07-02 ENCOUNTER — Ambulatory Visit (HOSPITAL_COMMUNITY)
Admission: EM | Admit: 2016-07-02 | Discharge: 2016-07-02 | Disposition: A | Discharge status: Home / Self Care | Attending: Family Medicine | Admitting: Family Medicine

## 2016-07-02 ENCOUNTER — Encounter (HOSPITAL_COMMUNITY): Payer: Self-pay | Admitting: Emergency Medicine

## 2016-07-02 DIAGNOSIS — S39012A Strain of muscle, fascia and tendon of lower back, initial encounter: Secondary | ICD-10-CM

## 2016-07-02 LAB — POCT PREGNANCY, URINE: PREG TEST UR: NEGATIVE

## 2016-07-02 MED ORDER — HYDROCODONE-ACETAMINOPHEN 5-325 MG PO TABS: 1.0000 | ORAL_TABLET | Freq: Four times a day (QID) | ORAL | 0 refills | Status: DC | PRN

## 2016-07-02 MED ORDER — NAPROXEN 500 MG PO TABS: 500.0000 mg | ORAL_TABLET | Freq: Two times a day (BID) | ORAL | 0 refills | Status: DC

## 2016-07-02 NOTE — ED Provider Notes (Signed)
[facility]    CSN: [id number] Arrival date & time: 07/02/16  1504     History   Chief Complaint Chief Complaint  Patient presents with  . Back Pain    HPI Kelsey Carroll is a 20 y.o. female.   20 yo woman with low back pain for 2 weeks.  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  No leg radiation.  She has had this before and needed some brief courses of pain reliever to get her through.      Past Medical History:  Diagnosis Date  . Obesity     Patient Active Problem List   Diagnosis Date Noted  . Depression 01/14/2013  . Hydradenitis 02/29/2012  . Impaired hearing 02/29/2012  . Obesity (BMI 35.0-39.9 without comorbidity) 02/29/2012    History reviewed. No pertinent surgical history.  OB History    Gravida Para Term Preterm AB Living   1       1     SAB TAB Ectopic Multiple Live Births     1             Home Medications    Prior to Admission medications   Medication Sig Start Date End Date Taking? Authorizing Provider  HYDROcodone-acetaminophen (NORCO) 5-325 MG tablet Take 1 tablet by mouth every 6 (six) hours as needed. 07/02/16   Elvina SidleKurt Philomina Leon, MD  naproxen (NAPROSYN) 500 MG tablet Take 1 tablet (500 mg total) by mouth 2 (two) times daily. 07/02/16   Elvina SidleKurt Sheldon Amara, MD    Family History Family History  Problem Relation Age of Onset  . Hypertension Mother 6648  . Anxiety disorder Mother   . Hyperlipidemia Father 6342  . Stroke Maternal Grandmother   . Anxiety disorder Sister     Social History Social History  Substance Use Topics  . Smoking status: Never Smoker  . Smokeless tobacco: Never Used  . Alcohol use Yes     Comment: ocassionally     Allergies   Patient has no known allergies.   Review of Systems Review of Systems  Constitutional: Negative.   Musculoskeletal: Positive for back pain.     Physical Exam Triage Vital Signs ED Triage Vitals  Enc Vitals Group   BP 07/02/16 1517 119/62     Pulse Rate 07/02/16 1517 77     Resp 07/02/16 1517 16     Temp 07/02/16 1517 98.9 F (37.2 C)     Temp Source 07/02/16 1517 Oral     SpO2 07/02/16 1517 100 %     Weight 07/02/16 1517 195 lb (88.5 kg)     Height 07/02/16 1517 5\' 6"  (1.676 m)     Head Circumference --      Peak Flow --      Pain Score 07/02/16 1519 5     Pain Loc --      Pain Edu? --      Excl. in GC? --    No data found.   Updated Vital Signs BP 119/62   Pulse 77   Temp 98.9 F (37.2 C) (Oral)   Resp 16   Ht 5\' 6"  (1.676 m)   Wt 195 lb (88.5 kg)   LMP 05/23/2016   SpO2 100%   BMI 31.47 kg/m   Physical Exam  Constitutional: She is oriented to person, place, and time. She appears well-developed and well-nourished.  HENT:  Right Ear: External ear normal.  Left Ear: External  ear normal.  Mouth/Throat: Oropharynx is clear and moist.  Eyes: Conjunctivae are normal.  Neck: Normal range of motion. Neck supple.  Pulmonary/Chest: Effort normal.  Musculoskeletal: Normal range of motion.  Neurological: She is alert and oriented to person, place, and time. Coordination normal.  Skin: Skin is warm and dry.  Nursing note and vitals reviewed.    UC Treatments / Results  Labs (all labs ordered are listed, but only abnormal results are displayed) Labs Reviewed  POCT PREGNANCY, URINE    EKG  EKG Interpretation None       Radiology No results found.  Procedures Procedures (including critical care time)  Medications Ordered in UC Medications - No data to display   Initial Impression / Assessment and Plan / UC Course  I have reviewed the triage vital signs and the nursing notes.  Pertinent labs & imaging results that were available during my care of the patient were reviewed by me and considered in my medical decision making (see chart for details).  Clinical Course    Results for orders placed or performed during the hospital encounter of 07/02/16  Pregnancy, urine  POC  Result Value Ref Range   Preg Test, Ur NEGATIVE NEGATIVE    Final Clinical Impressions(s) / UC Diagnoses   Final diagnoses:  Strain of lumbar region, initial encounter    New Prescriptions Current Discharge Medication List       Elvina SidleKurt Semaja Lymon, MD 07/02/16 [phone]

## 2016-07-02 NOTE — ED Triage Notes (Signed)
PT reports lower back pain and pain between shoulder blades. 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 PT reports she is also late for her menstrual cycle and had a negative home preg test.

## 2016-07-07 ENCOUNTER — Encounter (HOSPITAL_COMMUNITY): Payer: Self-pay | Admitting: Emergency Medicine

## 2016-07-07 ENCOUNTER — Ambulatory Visit (HOSPITAL_COMMUNITY)
Admission: EM | Admit: 2016-07-07 | Discharge: 2016-07-07 | Disposition: A | Discharge status: Home / Self Care | Attending: Family Medicine | Admitting: Family Medicine

## 2016-07-07 DIAGNOSIS — B9789 Other viral agents as the cause of diseases classified elsewhere: Secondary | ICD-10-CM | POA: Diagnosis not present

## 2016-07-07 DIAGNOSIS — J069 Acute upper respiratory infection, unspecified: Secondary | ICD-10-CM | POA: Diagnosis not present

## 2016-07-07 MED ORDER — PROMETHAZINE-CODEINE 6.25-10 MG/5ML PO SYRP: 10.0000 mL | ORAL_SOLUTION | Freq: Four times a day (QID) | ORAL | 0 refills | Status: DC | PRN

## 2016-07-07 MED ORDER — IPRATROPIUM BROMIDE 0.06 % NA SOLN: 2.0000 | Freq: Four times a day (QID) | NASAL | 1 refills | Status: DC

## 2016-07-07 NOTE — ED Triage Notes (Signed)
[facility] a [facility] dry cough, SOB and a sore throat x 3 days.

## 2016-07-07 NOTE — ED Provider Notes (Signed)
[facility]    CSN: [id number] Arrival date & time: 07/07/16  1959     History   Chief Complaint Chief Complaint  Patient presents with  . Cough    HPI Kelsey Carroll is a 20 y.o. female.   The history is provided by the patient.  Cough  Cough characteristics:  Non-productive, harsh and dry Onset quality:  Gradual Duration:  3 days Progression:  Unchanged Chronicity:  New Smoker: no   Context: upper respiratory infection   Relieved by:  None tried Worsened by:  Nothing Ineffective treatments:  None tried Associated symptoms: rhinorrhea, sinus congestion and sore throat   Associated symptoms: no chest pain, no chills, no fever and no shortness of breath     Past Medical History:  Diagnosis Date  . Obesity     Patient Active Problem List   Diagnosis Date Noted  . Depression 01/14/2013  . Hydradenitis 02/29/2012  . Impaired hearing 02/29/2012  . Obesity (BMI 35.0-39.9 without comorbidity) 02/29/2012    History reviewed. No pertinent surgical history.  OB History    Gravida Para Term Preterm AB Living   1       1     SAB TAB Ectopic Multiple Live Births     1             Home Medications    Prior to Admission medications   Medication Sig Start Date End Date Taking? Authorizing Provider  HYDROcodone-acetaminophen (NORCO) 5-325 MG tablet Take 1 tablet by mouth every 6 (six) hours as needed. 07/02/16  Yes Elvina SidleKurt Lauenstein, MD  naproxen (NAPROSYN) 500 MG tablet Take 1 tablet (500 mg total) by mouth 2 (two) times daily. 07/02/16  Yes Elvina SidleKurt Lauenstein, MD    Family History Family History  Problem Relation Age of Onset  . Hypertension Mother 1548  . Anxiety disorder Mother   . Hyperlipidemia Father 5942  . Stroke Maternal Grandmother   . Anxiety disorder Sister     Social History Social History  Substance Use Topics  . Smoking status: Never Smoker  . Smokeless tobacco: Never Used  . Alcohol use Yes     Comment: ocassionally      Allergies   Patient has no known allergies.   Review of Systems Review of Systems  Constitutional: Negative.  Negative for chills and fever.  HENT: Positive for congestion, postnasal drip, rhinorrhea and sore throat.   Respiratory: Positive for cough. Negative for shortness of breath.   Cardiovascular: Negative.  Negative for chest pain.  Gastrointestinal: Negative.      Physical Exam Triage Vital Signs ED Triage Vitals  Enc Vitals Group     BP 07/07/16 2023 122/76     Pulse Rate 07/07/16 2023 77     Resp 07/07/16 2023 18     Temp 07/07/16 2023 98.2 F (36.8 C)     Temp Source 07/07/16 2023 Oral     SpO2 07/07/16 2023 100 %     Weight --      Height --      Head Circumference --      Peak Flow --      Pain Score 07/07/16 2027 0     Pain Loc --      Pain Edu? --      Excl. in GC? --    No data found.   Updated Vital Signs BP 122/76 (BP Location: Left Arm)   Pulse 77   Temp 98.2 F (36.8 C) (Oral)  Resp 18   LMP 06/22/2016 (Exact Date)   SpO2 100%   Visual Acuity Right Eye Distance:   Left Eye Distance:   Bilateral Distance:    Right Eye Near:   Left Eye Near:    Bilateral Near:     Physical Exam  Constitutional: She is oriented to person, place, and time. She appears well-developed and well-nourished.  HENT:  Right Ear: External ear normal.  Left Ear: External ear normal.  Nose: Nose normal.  Mouth/Throat: Oropharynx is clear and moist.  Eyes: Pupils are equal, round, and reactive to light.  Neck: Normal range of motion. Neck supple.  Cardiovascular: Normal rate, regular rhythm, normal heart sounds and intact distal pulses.   Pulmonary/Chest: Effort normal and breath sounds normal.  Lymphadenopathy:    She has no cervical adenopathy.  Neurological: She is alert and oriented to person, place, and time.  Skin: Skin is warm and dry.  Nursing note and vitals reviewed.    UC Treatments / Results  Labs (all labs ordered are listed, but  only abnormal results are displayed) Labs Reviewed - No data to display  EKG  EKG Interpretation None       Radiology No results found.  Procedures Procedures (including critical care time)  Medications Ordered in UC Medications - No data to display   Initial Impression / Assessment and Plan / UC Course  I have reviewed the triage vital signs and the nursing notes.  Pertinent labs & imaging results that were available during my care of the patient were reviewed by me and considered in my medical decision making (see chart for details).  Clinical Course       Final Clinical Impressions(s) / UC Diagnoses   Final diagnoses:  None    New Prescriptions New Prescriptions   No medications on file     Linna HoffJames D Johnasia Liese, MD 07/20/16 1011

## 2016-07-07 NOTE — Discharge Instructions (Signed)
Drink plenty of fluids as discussed, use medicine as prescribed, and mucinex or delsym for cough. Return or see your doctor if further problems °

## 2016-07-13 ENCOUNTER — Encounter (HOSPITAL_COMMUNITY): Payer: Self-pay | Admitting: Family Medicine

## 2016-07-13 ENCOUNTER — Ambulatory Visit (HOSPITAL_COMMUNITY)
Admission: EM | Admit: 2016-07-13 | Discharge: 2016-07-13 | Disposition: A | Discharge status: Home / Self Care | Attending: Family Medicine | Admitting: Family Medicine

## 2016-07-13 DIAGNOSIS — N926 Irregular menstruation, unspecified: Secondary | ICD-10-CM

## 2016-07-13 DIAGNOSIS — J40 Bronchitis, not specified as acute or chronic: Secondary | ICD-10-CM

## 2016-07-13 DIAGNOSIS — R05 Cough: Secondary | ICD-10-CM | POA: Diagnosis not present

## 2016-07-13 DIAGNOSIS — R059 Cough, unspecified: Secondary | ICD-10-CM

## 2016-07-13 MED ORDER — AZITHROMYCIN 250 MG PO TABS: 250.0000 mg | ORAL_TABLET | Freq: Every day | ORAL | 0 refills | Status: DC

## 2016-07-13 MED ORDER — BENZONATATE 100 MG PO CAPS: 200.0000 mg | ORAL_CAPSULE | Freq: Three times a day (TID) | ORAL | 0 refills | Status: DC | PRN

## 2016-07-13 MED ORDER — ALBUTEROL SULFATE HFA 108 (90 BASE) MCG/ACT IN AERS: 2.0000 | INHALATION_SPRAY | RESPIRATORY_TRACT | 0 refills | Status: DC | PRN

## 2016-07-13 MED ORDER — METHYLPREDNISOLONE 4 MG PO TBPK: ORAL_TABLET | ORAL | 0 refills | Status: DC

## 2016-07-13 NOTE — ED Triage Notes (Signed)
Pt here for URI symptoms. Was seen here last week and not getting better. sts she needs a work note. sts also her periods have been irregular.

## 2016-07-13 NOTE — ED Provider Notes (Signed)
CSN: [id number]     Arrival date & time 07/13/16  1933 History   First MD Initiated Contact with Patient 07/13/16 2006     Chief Complaint  Patient presents with  . URI   (Consider location/radiation/quality/duration/timing/severity/associated sxs/prior Treatment) Patient states she has cough and wheezing for a week and is not better.  She also c/o irregular menses.   The history is provided by the patient.  URI  Presenting symptoms: congestion and cough   Severity:  Moderate Onset quality:  Sudden Duration:  1 week Timing:  Constant Progression:  Worsening Chronicity:  Recurrent Relieved by:  Nothing Worsened by:  Nothing Ineffective treatments:  None tried   Past Medical History:  Diagnosis Date  . Obesity    History reviewed. No pertinent surgical history. Family History  Problem Relation Age of Onset  . Hypertension Mother 8  . Anxiety disorder Mother   . Hyperlipidemia Father 43  . Stroke Maternal Grandmother   . Anxiety disorder Sister    Social History  Substance Use Topics  . Smoking status: Never Smoker  . Smokeless tobacco: Never Used  . Alcohol use Yes     Comment: ocassionally   OB History    Gravida Para Term Preterm AB Living   1       1     SAB TAB Ectopic Multiple Live Births     1           Review of Systems  Constitutional: Negative.   HENT: Positive for congestion.   Eyes: Negative.   Respiratory: Positive for cough.   Cardiovascular: Negative.   Gastrointestinal: Negative.   Endocrine: Negative.   Genitourinary: Negative.   Musculoskeletal: Negative.   Allergic/Immunologic: Negative.   Neurological: Negative.   Hematological: Negative.   Psychiatric/Behavioral: Negative.     Allergies  Patient has no known allergies.  Home Medications   Prior to Admission medications   Medication Sig Start Date End Date Taking? Authorizing Provider  albuterol (PROVENTIL HFA;VENTOLIN HFA) 108 (90 Base) MCG/ACT inhaler Inhale 2 puffs into  the lungs every 4 (four) hours as needed for wheezing or shortness of breath. 07/13/16   Deatra Canter, FNP  azithromycin (ZITHROMAX) 250 MG tablet Take 1 tablet (250 mg total) by mouth daily. Take first 2 tablets together, then 1 every day until finished. 07/13/16   Deatra Canter, FNP  benzonatate (TESSALON) 100 MG capsule Take 2 capsules (200 mg total) by mouth 3 (three) times daily as needed for cough. 07/13/16   Deatra Canter, FNP  HYDROcodone-acetaminophen (NORCO) 5-325 MG tablet Take 1 tablet by mouth every 6 (six) hours as needed. 07/02/16   Elvina Sidle, MD  ipratropium (ATROVENT) 0.06 % nasal spray Place 2 sprays into both nostrils 4 (four) times daily. 07/07/16   Linna Hoff, MD  methylPREDNISolone (MEDROL DOSEPAK) 4 MG TBPK tablet Take 6-5-4-3-2-1 po qd 07/13/16   Deatra Canter, FNP  naproxen (NAPROSYN) 500 MG tablet Take 1 tablet (500 mg total) by mouth 2 (two) times daily. 07/02/16   Elvina Sidle, MD  promethazine-codeine [facility] WITH CODEINE) 6.25-10 MG/5ML syrup Take 10 mLs by mouth every 6 (six) hours as needed for cough. 07/07/16   Linna Hoff, MD   Meds Ordered and Administered this Visit  Medications - No data to display  LMP 06/22/2016 (Exact Date)  No data found.   Physical Exam  Constitutional: She is oriented to person, place, and time. She appears well-developed and well-nourished.  HENT:  Head: Normocephalic and atraumatic.  Right Ear: External ear normal.  Left Ear: External ear normal.  Mouth/Throat: Oropharynx is clear and moist.  Eyes: Conjunctivae and EOM are normal. Pupils are equal, round, and reactive to light.  Neck: Normal range of motion. Neck supple.  Cardiovascular: Normal rate, regular rhythm and normal heart sounds.   Pulmonary/Chest: Effort normal.  BBS diminished bilateral  Abdominal: Soft. Bowel sounds are normal.  Neurological: She is alert and oriented to person, place, and time.  Nursing note and vitals reviewed.   Urgent  Care Course   Clinical Course     Procedures (including critical care time)  Labs Review Labs Reviewed - No data to display  Imaging Review No results found.   Visual Acuity Review  Right Eye Distance:   Left Eye Distance:   Bilateral Distance:    Right Eye Near:   Left Eye Near:    Bilateral Near:         MDM   1. Bronchitis   2. Cough   3. Irregular menses    Zithromax pak Medrol dose pack as directed Tessalon perles Albuterol MDI  [facility]  Push po fluids, rest, tylenol and motrin otc prn as directed for fever, arthralgias, and myalgias.  Follow up prn if sx's continue or persist.    Deatra CanterWilliam J Harutyun Monteverde, FNP 07/13/16 2025    Anselm PancoastWilliam J CarrizoOxford, FNP 07/13/16 2026

## 2016-07-29 ENCOUNTER — Encounter (HOSPITAL_COMMUNITY): Payer: Self-pay

## 2016-07-29 ENCOUNTER — Emergency Department (HOSPITAL_COMMUNITY)
Admission: EM | Admit: 2016-07-29 | Discharge: 2016-07-30 | Disposition: A | Discharge status: Home / Self Care | Attending: Emergency Medicine | Admitting: Emergency Medicine

## 2016-07-29 DIAGNOSIS — Z79899 Other long term (current) drug therapy: Secondary | ICD-10-CM | POA: Insufficient documentation

## 2016-07-29 DIAGNOSIS — L02411 Cutaneous abscess of right axilla: Secondary | ICD-10-CM | POA: Insufficient documentation

## 2016-07-29 MED ORDER — LIDOCAINE-EPINEPHRINE (PF) 2 %-1:200000 IJ SOLN
10.0000 mL | Freq: Once | INTRAMUSCULAR | Status: AC
  Administered 2016-07-29: 10 mL via INTRADERMAL
  Filled 2016-07-29: qty 20

## 2016-07-29 MED ORDER — OXYCODONE HCL 5 MG PO TABS
10.0000 mg | ORAL_TABLET | Freq: Once | ORAL | Status: AC
  Administered 2016-07-29: 10 mg via ORAL
  Filled 2016-07-29: qty 2

## 2016-07-29 MED ORDER — LORAZEPAM 1 MG PO TABS
1.0000 mg | ORAL_TABLET | Freq: Once | ORAL | Status: AC
  Administered 2016-07-29: 1 mg via ORAL
  Filled 2016-07-29: qty 1

## 2016-07-29 NOTE — ED Provider Notes (Signed)
[facility]   CSN: [id number] Arrival date & time: 07/29/16  1956     History   Chief Complaint Chief Complaint  Patient presents with  . Abscess    HPI Kelsey Carroll is a 21 y.o. female with a hx of Obesity, hidradenitis, recurrent abscesses presents to the Emergency Department complaining of gradual, persistent, progressively worsening right axilla abscess onset 1 week ago. Patient reports she initially had 2 but one popped and she did not initially seek care hoping the second one would do the same. She reports she has been taking someone else's Percocet for pain because the ibuprofen did not help. She reports that she has previously been sedated for her I&D procedures and requests this multiple times throughout our interaction. Record review shows Last I&D in the emergency department was 2015 patient was sedated with Versed. Patient denies fever or chills, nausea or vomiting. She's had no formal diagnosis of MRSA. No antibiotics for this abscess.     The history is provided by the patient and medical records. No language interpreter was used.    Past Medical History:  Diagnosis Date  . Obesity     Patient Active Problem List   Diagnosis Date Noted  . Depression 01/14/2013  . Hydradenitis 02/29/2012  . Impaired hearing 02/29/2012  . Obesity (BMI 35.0-39.9 without comorbidity) 02/29/2012    History reviewed. No pertinent surgical history.  OB History    Gravida Para Term Preterm AB Living   1       1     SAB TAB Ectopic Multiple Live Births     1             Home Medications    Prior to Admission medications   Medication Sig Start Date End Date Taking? Authorizing Provider  albuterol (PROVENTIL HFA;VENTOLIN HFA) 108 (90 Base) MCG/ACT inhaler Inhale 2 puffs into the lungs every 4 (four) hours as needed for wheezing or shortness of breath. 07/13/16   Deatra CanterWilliam J Oxford, FNP  azithromycin (ZITHROMAX) 250 MG tablet Take 1 tablet (250 mg total) by  mouth daily. Take first 2 tablets together, then 1 every day until finished. 07/13/16   Deatra CanterWilliam J Oxford, FNP  benzonatate (TESSALON) 100 MG capsule Take 2 capsules (200 mg total) by mouth 3 (three) times daily as needed for cough. 07/13/16   Deatra CanterWilliam J Oxford, FNP  clindamycin (CLEOCIN) 150 MG capsule Take 3 capsules (450 mg total) by mouth 3 (three) times daily. 07/30/16   Mussa Groesbeck, PA-C  HYDROcodone-acetaminophen (NORCO/VICODIN) 5-325 MG tablet Take 1 tablet by mouth every 6 (six) hours as needed for moderate pain or severe pain. 07/30/16   Genese Quebedeaux, PA-C  ipratropium (ATROVENT) 0.06 % nasal spray Place 2 sprays into both nostrils 4 (four) times daily. 07/07/16   Linna HoffJames D Kindl, MD  methylPREDNISolone (MEDROL DOSEPAK) 4 MG TBPK tablet Take 6-5-4-3-2-1 po qd 07/13/16   Deatra CanterWilliam J Oxford, FNP  naproxen (NAPROSYN) 500 MG tablet Take 1 tablet (500 mg total) by mouth 2 (two) times daily. 07/02/16   Elvina SidleKurt Lauenstein, MD  promethazine-codeine [facility](PHENERGAN WITH CODEINE) 6.25-10 MG/5ML syrup Take 10 mLs by mouth every 6 (six) hours as needed for cough. 07/07/16   Linna HoffJames D Kindl, MD    Family History Family History  Problem Relation Age of Onset  . Hypertension Mother 448  . Anxiety disorder Mother   . Hyperlipidemia Father 3642  . Stroke Maternal Grandmother   . Anxiety disorder Sister  Social History Social History  Substance Use Topics  . Smoking status: Never Smoker  . Smokeless tobacco: Never Used  . Alcohol use Yes     Comment: ocassionally     Allergies   Patient has no known allergies.   Review of Systems Review of Systems  Skin: Positive for color change.       Abscess  All other systems reviewed and are negative.    Physical Exam Updated Vital Signs BP 129/73   Pulse 83   Temp 98.2 F (36.8 C)   Resp 18   Ht 5\' 6"  (1.676 m)   LMP 06/22/2016 (Exact Date)   SpO2 100%   Physical Exam  Constitutional: She is oriented to person, place, and time. She appears  well-developed and well-nourished. No distress.  Awake, alert, nontoxic appearance  HENT:  Head: Normocephalic and atraumatic.  Mouth/Throat: Oropharynx is clear and moist. No oropharyngeal exudate.  Eyes: Conjunctivae are normal. No scleral icterus.  Neck: Normal range of motion. Neck supple.  Cardiovascular: Normal rate, regular rhythm and intact distal pulses.   Pulmonary/Chest: Effort normal and breath sounds normal. No respiratory distress. She has no wheezes.  Equal chest expansion  Abdominal: Soft. Bowel sounds are normal. She exhibits no distension and no mass. There is no tenderness. There is no rebound and no guarding.  Musculoskeletal: Normal range of motion. She exhibits no edema.  Lymphadenopathy:    She has no cervical adenopathy.  Neurological: She is alert and oriented to person, place, and time.  Speech is clear and goal oriented Moves extremities without ataxia  Skin: Skin is warm and dry. She is not diaphoretic. There is erythema.  Large area of erythema and induration to the right axilla with a large pocket of fluctuance, actively draining foul-smelling and purulent fluid  Psychiatric: She has a normal mood and affect.  Nursing note and vitals reviewed.    ED Treatments / Results   Procedures Procedures (including critical care time)  EMERGENCY DEPARTMENT US SOFT TISSUE INTERPRETATION "Study: Limited Ultrasound of the noted body part in comments below"  INDICATIONS: Pain and Soft tissue infection Multiple views of the body part are obtained with a multi-frequency linear probe  PERFORMED BY:  Myself  IMAGES ARCHIVED?: Yes  SIDE:Right   BODY PART:Axilla  FINDINGS: Abcess present and Cellulitis present  LIMITATIONS:  Body Habitus  INTERPRETATION:  Abcess present and Cellulitis present  COMMENT:  Very large abscess with surrounding cellulitis    Medications Ordered in ED Medications  lidocaine-EPINEPHrine (XYLOCAINE W/EPI) 2 %-1:200000 (PF)  injection 10 mL (10 mLs Intradermal Given 07/29/16 2229)  oxyCODONE (Oxy IR/ROXICODONE) immediate release tablet 10 mg (10 mg Oral Given 07/29/16 2325)  LORazepam (ATIVAN) tablet 1 mg (1 mg Oral Given 07/29/16 2325)     Initial Impression / Assessment and Plan / ED Course  I have reviewed the triage vital signs and the nursing notes.  Pertinent labs & imaging results that were available during my care of the patient were reviewed by me and considered in my medical decision making (see chart for details).  Clinical Course as of Jul 30 105  Caleen Essex Jul 30, 2016  0026 Even after pain and anxiety control, pt declined I&D.  Discussed the risk of incomplete resolution and possible worsening, including systemic infection with patient. She states understanding and refuses I&D.  She does allow for manual expression of purulence through the already draining hole with expression of approx 5-7cc of purulent fluid.    [HM]  [address] [place] narcotic database accessed. Patient received Norco on 04/19/2016 and again on 07/02/2016 by different providers. Will give several tablets today.  [HM]    Clinical Course User Index [HM] Dahlia Client Andrena Margerum, PA-C    Pt with large Abscess to the right exit lab. Patient given oxycodone and Ativan. Even after this she states she does not want the abscess incised. It is currently draining in the emergency department. Medial expression of a large amount of pus.  Recent discharge home with clindamycin and a short course of hydrocodone. Discussed warm compresses and flushing of the abscess. Also discussed return to the emergency department in 24-48 hours for recheck.  Patient states understanding and is in agreement with the plan.  Final Clinical Impressions(s) / ED Diagnoses   Final diagnoses:  Abscess of axilla, right    New Prescriptions New Prescriptions   CLINDAMYCIN (CLEOCIN) 150 MG CAPSULE    Take 3 capsules (450 mg total) by mouth 3 (three) times daily.    HYDROCODONE-ACETAMINOPHEN (NORCO/VICODIN) 5-325 MG TABLET    Take 1 tablet by mouth every 6 (six) hours as needed for moderate pain or severe pain.     Dahlia Client Verdia Bolt, PA-C 07/30/16 0110    Jestin Burbach, PA-C 07/30/16 0111    Benjiman Core, MD 07/30/16 [phone]

## 2016-07-29 NOTE — ED Triage Notes (Signed)
Pt states that she has an abscess under her R arm, pt has has this problem many times before, pt states that she is normally sedated to have them drained and is requesting this. No drainage noted.

## 2016-07-29 NOTE — ED Notes (Signed)
Pt states that she has had abcess to R axilla x 1 week. Initially she had 2 but one popped and she "was hopping this one would do the same." Pt states she has been taking percocet at home for the pain because "ibuprofen did not help"  Pt also states that she previously has been sedated for I&D procedures  And requesting for this visit.

## 2016-07-30 MED ORDER — HYDROCODONE-ACETAMINOPHEN 5-325 MG PO TABS: 1.0000 | ORAL_TABLET | Freq: Four times a day (QID) | ORAL | 0 refills | Status: DC | PRN

## 2016-07-30 MED ORDER — CLINDAMYCIN HCL 150 MG PO CAPS: 450.0000 mg | ORAL_CAPSULE | Freq: Three times a day (TID) | ORAL | 0 refills | Status: DC

## 2016-07-30 NOTE — Discharge Instructions (Signed)
1. Medications: home percocet Rx for pain, Clindamycin, usual home medications 2. Treatment: rest, drink plenty of fluids, use warm compresses, flush abscess with warm water several times per day 3. Follow Up: Please followup with your primary doctor in 2-3 days for discussion of your diagnoses and further evaluation after today's visit; if you do not have a primary care doctor use the resource guide provided to find one; Please return to the ER for fevers, chills, nausea, vomiting or other signs of infection

## 2016-08-10 ENCOUNTER — Encounter (HOSPITAL_COMMUNITY): Payer: Self-pay | Admitting: Emergency Medicine

## 2016-08-10 ENCOUNTER — Emergency Department (HOSPITAL_COMMUNITY)
Admission: EM | Admit: 2016-08-10 | Discharge: 2016-08-10 | Disposition: A | Discharge status: Home / Self Care | Attending: Dermatology | Admitting: Dermatology

## 2016-08-10 DIAGNOSIS — Z5321 Procedure and treatment not carried out due to patient leaving prior to being seen by health care provider: Secondary | ICD-10-CM | POA: Insufficient documentation

## 2016-08-10 DIAGNOSIS — M545 Low back pain: Secondary | ICD-10-CM | POA: Diagnosis not present

## 2016-08-10 NOTE — ED Triage Notes (Signed)
Pt is c/o lower back pain  Pt states she was seen a couple months ago for back pain at [facility] and was given hydrocodone 5mg  and states it does not help with her pain  Pt states she would also like to be seen and checked for STDs  Pt states she has itching

## 2016-08-10 NOTE — ED Triage Notes (Signed)
Patient didn't answer for assigned room.

## 2016-08-10 NOTE — ED Notes (Signed)
Pt called from lobby with no response x2 

## 2016-08-11 ENCOUNTER — Encounter (HOSPITAL_COMMUNITY): Payer: Self-pay | Admitting: Emergency Medicine

## 2016-08-11 ENCOUNTER — Emergency Department (HOSPITAL_COMMUNITY)
Admission: EM | Admit: 2016-08-11 | Discharge: 2016-08-11 | Disposition: A | Discharge status: Home / Self Care | Attending: Emergency Medicine | Admitting: Emergency Medicine

## 2016-08-11 DIAGNOSIS — Z79899 Other long term (current) drug therapy: Secondary | ICD-10-CM | POA: Insufficient documentation

## 2016-08-11 DIAGNOSIS — S39012A Strain of muscle, fascia and tendon of lower back, initial encounter: Secondary | ICD-10-CM | POA: Diagnosis not present

## 2016-08-11 DIAGNOSIS — Y939 Activity, unspecified: Secondary | ICD-10-CM | POA: Insufficient documentation

## 2016-08-11 DIAGNOSIS — Y999 Unspecified external cause status: Secondary | ICD-10-CM | POA: Insufficient documentation

## 2016-08-11 DIAGNOSIS — S3992XA Unspecified injury of lower back, initial encounter: Secondary | ICD-10-CM | POA: Diagnosis present

## 2016-08-11 DIAGNOSIS — N760 Acute vaginitis: Secondary | ICD-10-CM | POA: Insufficient documentation

## 2016-08-11 DIAGNOSIS — X58XXXA Exposure to other specified factors, initial encounter: Secondary | ICD-10-CM | POA: Diagnosis not present

## 2016-08-11 DIAGNOSIS — Y929 Unspecified place or not applicable: Secondary | ICD-10-CM | POA: Insufficient documentation

## 2016-08-11 DIAGNOSIS — B9689 Other specified bacterial agents as the cause of diseases classified elsewhere: Secondary | ICD-10-CM

## 2016-08-11 DIAGNOSIS — Z202 Contact with and (suspected) exposure to infections with a predominantly sexual mode of transmission: Secondary | ICD-10-CM

## 2016-08-11 LAB — POC URINE PREG, ED: Preg Test, Ur: NEGATIVE

## 2016-08-11 LAB — WET PREP, GENITAL
Clue Cells Wet Prep HPF POC: NONE SEEN
SPERM: NONE SEEN
TRICH WET PREP: NONE SEEN
YEAST WET PREP: NONE SEEN

## 2016-08-11 LAB — URINALYSIS, ROUTINE W REFLEX MICROSCOPIC
BILIRUBIN URINE: NEGATIVE
GLUCOSE, UA: NEGATIVE mg/dL
Ketones, ur: NEGATIVE mg/dL
Nitrite: NEGATIVE
PH: 6.5 (ref 5.0–8.0)
Protein, ur: NEGATIVE mg/dL
SPECIFIC GRAVITY, URINE: 1.01 (ref 1.005–1.030)

## 2016-08-11 LAB — URINALYSIS, MICROSCOPIC (REFLEX)
BACTERIA UA: NONE SEEN
RBC / HPF: NONE SEEN RBC/hpf (ref 0–5)

## 2016-08-11 MED ORDER — LIDOCAINE HCL (PF) 1 % IJ SOLN
INTRAMUSCULAR | Status: AC
  Administered 2016-08-11: 5 mL
  Filled 2016-08-11: qty 5

## 2016-08-11 MED ORDER — AZITHROMYCIN 250 MG PO TABS
1000.0000 mg | ORAL_TABLET | Freq: Once | ORAL | Status: AC
  Administered 2016-08-11: 1000 mg via ORAL
  Filled 2016-08-11: qty 4

## 2016-08-11 MED ORDER — CEFTRIAXONE SODIUM 250 MG IJ SOLR
250.0000 mg | Freq: Once | INTRAMUSCULAR | Status: AC
  Administered 2016-08-11: 250 mg via INTRAMUSCULAR
  Filled 2016-08-11: qty 250

## 2016-08-11 MED ORDER — METRONIDAZOLE 500 MG PO TABS: 500.0000 mg | ORAL_TABLET | Freq: Two times a day (BID) | ORAL | 0 refills | Status: DC

## 2016-08-11 MED ORDER — MELOXICAM 7.5 MG PO TABS: 15.0000 mg | ORAL_TABLET | Freq: Every day | ORAL | 0 refills | Status: DC

## 2016-08-11 NOTE — Discharge Instructions (Signed)
You currently have cultures pending for STD evaluation. If positive, you will be notified in 2-3 days with the results and he will need to notify your sexual partners. You have already been treated for this. Please take your medications as prescribed. Take all of your antibiotics, do not save or share them. Follow-up with your doctor for further evaluation of your symptoms. Return to ED for new or worsening symptoms as we discussed.

## 2016-08-11 NOTE — ED Provider Notes (Signed)
[facility]-EMERGENCY DEPT Provider Note   CSN: [id number] Arrival date & time: 08/11/16  1000     History   Chief Complaint Chief Complaint  Patient presents with  . Back Pain  . Exposure to STD    HPI Kelsey Carroll is a 21 y.o. female.  HPI here for evaluation of low back pain, vaginal itching and discharge. She reports she has had low back pain ongoing for several months now, related to working at [employer]. She is tried ibuprofen and hydrocodone with minimal relief. Symptoms are worse with movements while at work. She denies fevers, chills, numbness or weakness, changes in bowel or bladder habits. Secondarily, she reports vaginal itching and thick white discharge that started 2 days ago. She reports unprotected sex 3 days ago with both female and female partners. Denies abdominal pain, nausea or vomiting, diarrhea or constipation, no dysuria or urinary urgency/hesitancy. Reports she just finished her menses.  Past Medical History:  Diagnosis Date  . Obesity     Patient Active Problem List   Diagnosis Date Noted  . Depression 01/14/2013  . Hydradenitis 02/29/2012  . Impaired hearing 02/29/2012  . Obesity (BMI 35.0-39.9 without comorbidity) 02/29/2012    History reviewed. No pertinent surgical history.  OB History    Gravida Para Term Preterm AB Living   1       1     SAB TAB Ectopic Multiple Live Births     1             Home Medications    Prior to Admission medications   Medication Sig Start Date End Date Taking? Authorizing Provider  clindamycin (CLEOCIN) 150 MG capsule Take 3 capsules (450 mg total) by mouth 3 (three) times daily. 07/30/16  Yes Hannah Muthersbaugh, PA-C  albuterol (PROVENTIL HFA;VENTOLIN HFA) 108 (90 Base) MCG/ACT inhaler Inhale 2 puffs into the lungs every 4 (four) hours as needed for wheezing or shortness of breath. Patient not taking: Reported on 08/11/2016 07/13/16   Deatra Canter, FNP  azithromycin (ZITHROMAX) 250 MG tablet Take 1 tablet (250 mg  total) by mouth daily. Take first 2 tablets together, then 1 every day until finished. Patient not taking: Reported on 08/11/2016 07/13/16   Deatra Canter, FNP  benzonatate (TESSALON) 100 MG capsule Take 2 capsules (200 mg total) by mouth 3 (three) times daily as needed for cough. Patient not taking: Reported on 08/11/2016 07/13/16   Deatra Canter, FNP  HYDROcodone-acetaminophen (NORCO/VICODIN) 5-325 MG tablet Take 1 tablet by mouth every 6 (six) hours as needed for moderate pain or severe pain. Patient not taking: Reported on 08/11/2016 07/30/16   Dahlia Client Muthersbaugh, PA-C  ipratropium (ATROVENT) 0.06 % nasal spray Place 2 sprays into both nostrils 4 (four) times daily. Patient not taking: Reported on 08/11/2016 07/07/16   Linna Hoff, MD  meloxicam (MOBIC) 7.5 MG tablet Take 2 tablets (15 mg total) by mouth daily. 08/11/16   Joycie Peek, PA-C  methylPREDNISolone (MEDROL DOSEPAK) 4 MG TBPK tablet Take 6-5-4-3-2-1 po qd Patient not taking: Reported on 08/11/2016 07/13/16   Deatra Canter, FNP  metroNIDAZOLE (FLAGYL) 500 MG tablet Take 1 tablet (500 mg total) by mouth 2 (two) times daily. 08/11/16   Joycie Peek, PA-C  naproxen (NAPROSYN) 500 MG tablet Take 1 tablet (500 mg total) by mouth 2 (two) times daily. Patient not taking: Reported on 08/11/2016 07/02/16   Elvina Sidle, MD  promethazine-codeine [facility] WITH CODEINE) 6.25-10 MG/5ML syrup Take 10 mLs by mouth every  6 (six) hours as needed for cough. Patient not taking: Reported on 08/11/2016 07/07/16   Linna Hoff, MD    Family History Family History  Problem Relation Age of Onset  . Hypertension Mother 74  . Anxiety disorder Mother   . Hyperlipidemia Father 84  . Stroke Maternal Grandmother   . Anxiety disorder Sister     Social History Social History  Substance Use Topics  . Smoking status: Never Smoker  . Smokeless tobacco: Never Used  . Alcohol use No     Allergies   Patient has no known allergies.   Review  of Systems Review of Systems A 10 point review of systems was completed and was negative except for pertinent positives and negatives as mentioned in the history of present illness    Physical Exam Updated Vital Signs BP 121/94   Pulse 74   Temp 97.8 F (36.6 C) (Oral)   Resp 16   SpO2 99%   Physical Exam  Constitutional: She is oriented to person, place, and time. She appears well-developed. No distress.  Awake, alert and nontoxic in appearance  HENT:  Head: Normocephalic and atraumatic.  Right Ear: External ear normal.  Left Ear: External ear normal.  Mouth/Throat: Oropharynx is clear and moist.  Eyes: Conjunctivae and EOM are normal. Pupils are equal, round, and reactive to light.  Neck: Normal range of motion. No JVD present.  Cardiovascular: Normal rate, regular rhythm and normal heart sounds.   Pulmonary/Chest: Effort normal and breath sounds normal. No stridor.  Abdominal: Soft. There is no tenderness.  Genitourinary:  Genitourinary Comments: Chaperone was present for the entire genital exam. No lesions or rashes appreciated on vulva. Cervix visualized on speculum exam and appropriate cultures sampled. Scant blood in vaginal vault. Discharge: Thick white. Positive whiff test Upon bi manual exam- No TTP of the adnexa, no cervical motion tenderness. No fullness or masses appreciated. No abnormalities appreciated in structural anatomy.   Musculoskeletal: Normal range of motion.  Mild tenderness to palpation in bilateral paraspinal lumbar musculature. No midline focal bony pain. Full active range of motion of CTL spine and lower extremities.  Neurological: She is alert and oriented to person, place, and time. No cranial nerve deficit. Coordination normal.  Awake, alert, cooperative and aware of situation; motor strength and sensation normal to light touch bilaterally; no facial asymmetry; tongue midline; major cranial nerves appear intact;  baseline gait without new ataxia.    Skin: No rash noted. She is not diaphoretic.  Psychiatric: She has a normal mood and affect. Her behavior is normal. Thought content normal.  Nursing note and vitals reviewed.    ED Treatments / Results  Labs (all labs ordered are listed, but only abnormal results are displayed) Labs Reviewed  WET PREP, GENITAL - Abnormal; Notable for the following:       Result Value   WBC, Wet Prep HPF POC FEW (*)    All other components within normal limits  URINALYSIS, ROUTINE W REFLEX MICROSCOPIC - Abnormal; Notable for the following:    Hgb urine dipstick SMALL (*)    Leukocytes, UA SMALL (*)    All other components within normal limits  URINALYSIS, MICROSCOPIC (REFLEX) - Abnormal; Notable for the following:    Squamous Epithelial / LPF 0-5 (*)    All other components within normal limits  RPR  HIV ANTIBODY (ROUTINE TESTING)  POC URINE PREG, ED  GC/CHLAMYDIA PROBE AMP ([place]) NOT AT [facility]    EKG  EKG Interpretation  None       Radiology No results found.  Procedures Procedures (including critical care time)  Medications Ordered in ED Medications  azithromycin (ZITHROMAX) tablet 1,000 mg (not administered)  cefTRIAXone (ROCEPHIN) injection 250 mg (not administered)     Initial Impression / Assessment and Plan / ED Course  I have reviewed the triage vital signs and the nursing notes.  Pertinent labs & imaging results that were available during my care of the patient were reviewed by me and considered in my medical decision making (see chart for details).     Patient with chronic low back pain, likely musculoskeletal in nature. Will try meloxicam, declines Toradol in ED. Discussed would need follow-up with PCP for chronic back pain. Appears very well with nonfocal neuro exam. Vaginal discharge consistent with bacterial vaginosis, treated empirically with metronidazole. Also treated empirically for STI/STD with 250 ceftriaxone IM, 1 g azithromycin, pending GC chlamydial  cultures. Urine without overt evidence of infection. Overall appears well and appropriate for outpatient follow-up. Return precautions discussed.  Final Clinical Impressions(s) / ED Diagnoses   Final diagnoses:  Strain of lumbar region, initial encounter  Bacterial vaginosis  Possible exposure to STD    New Prescriptions New Prescriptions   MELOXICAM (MOBIC) 7.5 MG TABLET    Take 2 tablets (15 mg total) by mouth daily.   METRONIDAZOLE (FLAGYL) 500 MG TABLET    Take 1 tablet (500 mg total) by mouth 2 (two) times daily.     Joycie PeekBenjamin Samaya Boardley, PA-C 08/11/16 1304    Mancel BaleElliott Wentz, MD 08/11/16 [phone]

## 2016-08-11 NOTE — ED Notes (Signed)
Pt educated on the need to be observed for 30 mins . With an IM injection. Pt reported she could not wait. Declined W/C at D/C and was escorted to lobby by RN.

## 2016-08-11 NOTE — ED Triage Notes (Signed)
Back pain , works at [employer] does not wear back brace, wants STD check  States is itchy in her privates

## 2016-08-12 LAB — GC/CHLAMYDIA PROBE AMP (~~LOC~~) NOT AT ARMC
CHLAMYDIA, DNA PROBE: NEGATIVE
Neisseria Gonorrhea: NEGATIVE

## 2016-08-12 LAB — HIV ANTIBODY (ROUTINE TESTING W REFLEX): HIV SCREEN 4TH GENERATION: NONREACTIVE

## 2016-08-12 LAB — RPR: RPR Ser Ql: NONREACTIVE

## 2016-09-06 ENCOUNTER — Emergency Department (HOSPITAL_COMMUNITY)
Admission: EM | Admit: 2016-09-06 | Discharge: 2016-09-06 | Disposition: A | Discharge status: Home / Self Care | Attending: Emergency Medicine | Admitting: Emergency Medicine

## 2016-09-06 ENCOUNTER — Encounter (HOSPITAL_COMMUNITY): Payer: Self-pay

## 2016-09-06 DIAGNOSIS — X500XXA Overexertion from strenuous movement or load, initial encounter: Secondary | ICD-10-CM | POA: Diagnosis not present

## 2016-09-06 DIAGNOSIS — Y929 Unspecified place or not applicable: Secondary | ICD-10-CM | POA: Diagnosis not present

## 2016-09-06 DIAGNOSIS — Y999 Unspecified external cause status: Secondary | ICD-10-CM | POA: Diagnosis not present

## 2016-09-06 DIAGNOSIS — Y9389 Activity, other specified: Secondary | ICD-10-CM | POA: Diagnosis not present

## 2016-09-06 DIAGNOSIS — M545 Low back pain, unspecified: Secondary | ICD-10-CM

## 2016-09-06 HISTORY — DX: Dorsalgia, unspecified: M54.9

## 2016-09-06 HISTORY — DX: Other chronic pain: G89.29

## 2016-09-06 LAB — POC URINE PREG, ED: Preg Test, Ur: NEGATIVE

## 2016-09-06 MED ORDER — METHOCARBAMOL 500 MG PO TABS: 500.0000 mg | ORAL_TABLET | Freq: Two times a day (BID) | ORAL | 0 refills | Status: DC

## 2016-09-06 MED ORDER — NAPROXEN 500 MG PO TABS: 500.0000 mg | ORAL_TABLET | Freq: Two times a day (BID) | ORAL | 0 refills | Status: DC

## 2016-09-06 NOTE — ED Triage Notes (Signed)
PT C/O CHRONIC LOWER BACK PAIN X1 WEEK. PT STS SHE WORKS FOR [employer] AND SHE IS CONSTANTLY LIFTING HEAVY BOXES. PT STS SHE WAS TAKING VICODIN 5MG , BUT THEY ARE NOT HELPING WITH THE PAIN. DENIES INJURY, NUMBNESS, OR URINARY SYMPTOMS.

## 2016-09-06 NOTE — ED Provider Notes (Signed)
[facility]-EMERGENCY DEPT Provider Note   CSN: [id number] Arrival date & time: 09/06/16  1500   By signing my name below, I, Kelsey Carroll, attest that this documentation has been prepared under the direction and in the presence of Kelsey Carroll, [place].  Electronically Signed: Cynda Carroll, Scribe. 09/06/16. 3:44 PM.  History   Chief Complaint Chief Complaint  Patient presents with  . Back Pain   HPI Comments: Kelsey Carroll is a 21 y.o. female with a hx of chronic back pain, who presents to the Emergency Department complaining of sudden-onset, constant worsening lower back pain that began one week ago. Patient states she has had intermittent back pain for a while now. Patient states she works at [employer], in which she is constantly lifting heavy boxes. No associated symptoms. Patient states she went to [facility] emergency department last month, in which she was prescribed 5 mg of hydrocodone with no relief. Patient describes her pain as "achy". No improvement with prescribed medications. Pain is worse with movement. Patient denies any injury, trauma, abdominal pain, fever, loss of bladder/bowel control, groin numbness, numbness/tingiling in the legs, weakness in the legs, IV drug use, or cancer history.   The history is provided by the patient. No language interpreter was used.    Past Medical History:  Diagnosis Date  . Chronic back pain   . Obesity     Patient Active Problem List   Diagnosis Date Noted  . Depression 01/14/2013  . Hydradenitis 02/29/2012  . Impaired hearing 02/29/2012  . Obesity (BMI 35.0-39.9 without comorbidity) 02/29/2012    History reviewed. No pertinent surgical history.  OB History    Gravida Para Term Preterm AB Living   1       1     SAB TAB Ectopic Multiple Live Births     1             Home Medications    Prior to Admission medications   Medication Sig Start Date End Date Taking? Authorizing Provider  albuterol (PROVENTIL HFA;VENTOLIN HFA) 108  (90 Base) MCG/ACT inhaler Inhale 2 puffs into the lungs every 4 (four) hours as needed for wheezing or shortness of breath. Patient not taking: Reported on 08/11/2016 07/13/16   Deatra Canter, FNP  azithromycin (ZITHROMAX) 250 MG tablet Take 1 tablet (250 mg total) by mouth daily. Take first 2 tablets together, then 1 every day until finished. Patient not taking: Reported on 08/11/2016 07/13/16   Deatra Canter, FNP  benzonatate (TESSALON) 100 MG capsule Take 2 capsules (200 mg total) by mouth 3 (three) times daily as needed for cough. Patient not taking: Reported on 08/11/2016 07/13/16   Deatra Canter, FNP  clindamycin (CLEOCIN) 150 MG capsule Take 3 capsules (450 mg total) by mouth 3 (three) times daily. 07/30/16   Hannah Muthersbaugh, PA-C  HYDROcodone-acetaminophen (NORCO/VICODIN) 5-325 MG tablet Take 1 tablet by mouth every 6 (six) hours as needed for moderate pain or severe pain. Patient not taking: Reported on 08/11/2016 07/30/16   Dahlia Client Muthersbaugh, PA-C  ipratropium (ATROVENT) 0.06 % nasal spray Place 2 sprays into both nostrils 4 (four) times daily. Patient not taking: Reported on 08/11/2016 07/07/16   Linna Hoff, MD  meloxicam (MOBIC) 7.5 MG tablet Take 2 tablets (15 mg total) by mouth daily. 08/11/16   Joycie Peek, PA-C  methocarbamol (ROBAXIN) 500 MG tablet Take 1 tablet (500 mg total) by mouth 2 (two) times daily. 09/06/16   Barrett Henle, PA-C  methylPREDNISolone (MEDROL  DOSEPAK) 4 MG TBPK tablet Take 6-5-4-3-2-1 po qd Patient not taking: Reported on 08/11/2016 07/13/16   Deatra Canter, FNP  metroNIDAZOLE (FLAGYL) 500 MG tablet Take 1 tablet (500 mg total) by mouth 2 (two) times daily. 08/11/16   Joycie Peek, PA-C  naproxen (NAPROSYN) 500 MG tablet Take 1 tablet (500 mg total) by mouth 2 (two) times daily. 09/06/16   Barrett Henle, PA-C  promethazine-codeine (PHENERGAN WITH CODEINE) 6.25-10 MG/5ML syrup Take 10 mLs by mouth every 6 (six) hours as needed for  cough. Patient not taking: Reported on 08/11/2016 07/07/16   Linna Hoff, MD    Family History Family History  Problem Relation Age of Onset  . Hypertension Mother 77  . Anxiety disorder Mother   . Hyperlipidemia Father 72  . Stroke Maternal Grandmother   . Anxiety disorder Sister     Social History Social History  Substance Use Topics  . Smoking status: Never Smoker  . Smokeless tobacco: Never Used  . Alcohol use No     Allergies   Patient has no known allergies.   Review of Systems Review of Systems  Constitutional: Negative for fever.  Gastrointestinal: Negative for abdominal pain, nausea and vomiting.  Genitourinary: Negative for difficulty urinating.  Musculoskeletal: Positive for back pain (lower).  Neurological: Negative for dizziness, weakness and numbness.     Physical Exam Updated Vital Signs BP 124/67 (BP Location: Left Arm)   Pulse 72   Temp 97.8 F (36.6 C) (Oral)   Resp 22   Ht 5\' 6"  (1.676 m)   Wt 84.5 kg   LMP 08/30/2016   SpO2 100%   BMI 30.05 kg/m   Physical Exam  Constitutional: She is oriented to person, place, and time. She appears well-developed and well-nourished.  HENT:  Head: Normocephalic and atraumatic.  Eyes: Conjunctivae and EOM are normal. Right eye exhibits no discharge. Left eye exhibits no discharge. No scleral icterus.  Neck: Normal range of motion. Neck supple.  Cardiovascular: Normal rate, regular rhythm, normal heart sounds and intact distal pulses.   Pulmonary/Chest: Effort normal and breath sounds normal. No respiratory distress. She has no wheezes. She has no rales. She exhibits no tenderness.  Abdominal: Soft. Bowel sounds are normal. She exhibits no distension and no mass. There is no tenderness. There is no rebound and no guarding.  Musculoskeletal: Normal range of motion. She exhibits tenderness. She exhibits no edema or deformity.  No midline C, T, or L tenderness. Tenderness to palpation of the bilateral  lumbar paraspinal muscles. Full range of motion of neck and back. Full range of motion of bilateral upper and lower extremities, with 5/5 strength. Sensation intact. 2+ radial and PT pulses. Cap refill <2 seconds. Patient able to stand and ambulate without assistance.    Neurological: She is alert and oriented to person, place, and time. She displays normal reflexes.  Skin: Skin is warm and dry. Capillary refill takes less than 2 seconds.  Nursing note and vitals reviewed.    ED Treatments / Results  DIAGNOSTIC STUDIES: Oxygen Saturation is 100% on RA, normal by my interpretation.    COORDINATION OF CARE: 3:43 PM Discussed treatment plan with pt at bedside and pt agreed to plan, which includes heat/ice alternation and a urine sample.   Labs (all labs ordered are listed, but only abnormal results are displayed) Labs Reviewed  POC URINE PREG, ED    EKG  EKG Interpretation None       Radiology No results found.  Procedures Procedures (including critical care time)  Medications Ordered in ED Medications - No data to display   Initial Impression / Assessment and Plan / ED Course  I have reviewed the triage vital signs and the nursing notes.  Pertinent labs & imaging results that were available during my care of the patient were reviewed by me and considered in my medical decision making (see chart for details).     NCCSRS website showed on 07/30/16 the patient filled rx of 5 5mg  hydrocodone.   Patient with back pain.  No neurological deficits and normal neuro exam.  Patient can walk but states is painful.  No loss of bowel or bladder control.  No concern for cauda equina.  No fever, night sweats, weight loss, h/o cancer, IVDU. Pregnancy negative.  RICE protocol and pain medicine indicated and discussed with patient.  Discussed return precautions.  Final Clinical Impressions(s) / ED Diagnoses   Final diagnoses:  Acute bilateral low back pain without sciatica    New  Prescriptions New Prescriptions   METHOCARBAMOL (ROBAXIN) 500 MG TABLET    Take 1 tablet (500 mg total) by mouth 2 (two) times daily.   NAPROXEN (NAPROSYN) 500 MG TABLET    Take 1 tablet (500 mg total) by mouth 2 (two) times daily.   I personally performed the services described in this documentation, which was scribed in my presence. The recorded information has been reviewed and is accurate.    Satira Sarkicole Elizabeth Barton HillsNadeau, [place]-C 09/06/16 1657    Dione Boozeavid Glick, MD 09/06/16 2329

## 2016-09-06 NOTE — Discharge Instructions (Signed)
Take your medication as prescribed. You may apply ice and/or heat to affected area for 15-20 minutes 3-4 times daily for additional pain relief. I recommend refraining from doing any heavy lifting or repetitive movements that exacerbate your symptoms for the next few days. Follow-up with the primary care provider's office assistant below within the next week if your symptoms have not improved. Please return to the Emergency Department if symptoms worsen or new onset of fever, numbness, tingling, groin anesthesia, loss of bowel or bladder, weakness.

## 2016-09-27 ENCOUNTER — Encounter (HOSPITAL_COMMUNITY): Payer: Self-pay | Admitting: *Deleted

## 2016-09-27 ENCOUNTER — Emergency Department (HOSPITAL_COMMUNITY)
Admission: EM | Admit: 2016-09-27 | Discharge: 2016-09-28 | Disposition: A | Discharge status: Home / Self Care | Attending: Emergency Medicine | Admitting: Emergency Medicine

## 2016-09-27 DIAGNOSIS — M545 Low back pain: Secondary | ICD-10-CM | POA: Insufficient documentation

## 2016-09-27 NOTE — ED Triage Notes (Signed)
Pt had to miss work due to a flare up of her  chronic back pain.  Pt would like a note for work.  Pt also would like a prescription for pain meds.  Pt is ambulatory, pain is chromic.  No distal neurolgia and no incontinence

## 2016-09-28 MED ORDER — DICLOFENAC SODIUM 50 MG PO TBEC: 50.0000 mg | DELAYED_RELEASE_TABLET | Freq: Two times a day (BID) | ORAL | 0 refills | Status: DC

## 2016-09-28 MED ORDER — METHOCARBAMOL 500 MG PO TABS: 500.0000 mg | ORAL_TABLET | Freq: Two times a day (BID) | ORAL | 0 refills | Status: DC

## 2016-09-28 NOTE — ED Provider Notes (Signed)
[facility]-EMERGENCY DEPT Provider Note   CSN: [id number] Arrival date & time: 09/27/16  2238     History   Chief Complaint Chief Complaint  Patient presents with  . Back Pain    HPI Kelsey Carroll is a 21 y.o. female.  The history is provided by the patient. No language interpreter was used.  Back Pain   This is a new problem. The current episode started more than 2 days ago. The problem has been gradually worsening. The pain is present in the lumbar spine. The pain is moderate. The pain is the same all the time. She has tried nothing for the symptoms. The treatment provided no relief.  Pt complains of pain in her low back.  Pt request hydrocodone and a work note.  Pt reports she has had back pain for several months. No primary MD  Past Medical History:  Diagnosis Date  . Chronic back pain   . Obesity     Patient Active Problem List   Diagnosis Date Noted  . Depression 01/14/2013  . Hydradenitis 02/29/2012  . Impaired hearing 02/29/2012  . Obesity (BMI 35.0-39.9 without comorbidity) 02/29/2012    History reviewed. No pertinent surgical history.  OB History    Gravida Para Term Preterm AB Living   1       1     SAB TAB Ectopic Multiple Live Births     1             Home Medications    Prior to Admission medications   Medication Sig Start Date End Date Taking? Authorizing Provider  albuterol (PROVENTIL HFA;VENTOLIN HFA) 108 (90 Base) MCG/ACT inhaler Inhale 2 puffs into the lungs every 4 (four) hours as needed for wheezing or shortness of breath. Patient not taking: Reported on 08/11/2016 07/13/16   Deatra Canter, FNP  azithromycin (ZITHROMAX) 250 MG tablet Take 1 tablet (250 mg total) by mouth daily. Take first 2 tablets together, then 1 every day until finished. Patient not taking: Reported on 08/11/2016 07/13/16   Deatra Canter, FNP  benzonatate (TESSALON) 100 MG capsule Take 2 capsules (200 mg total) by mouth 3 (three) times daily as needed for cough. Patient  not taking: Reported on 08/11/2016 07/13/16   Deatra Canter, FNP  clindamycin (CLEOCIN) 150 MG capsule Take 3 capsules (450 mg total) by mouth 3 (three) times daily. 07/30/16   Hannah Muthersbaugh, PA-C  HYDROcodone-acetaminophen (NORCO/VICODIN) 5-325 MG tablet Take 1 tablet by mouth every 6 (six) hours as needed for moderate pain or severe pain. Patient not taking: Reported on 08/11/2016 07/30/16   Dahlia Client Muthersbaugh, PA-C  ipratropium (ATROVENT) 0.06 % nasal spray Place 2 sprays into both nostrils 4 (four) times daily. Patient not taking: Reported on 08/11/2016 07/07/16   Linna Hoff, MD  meloxicam (MOBIC) 7.5 MG tablet Take 2 tablets (15 mg total) by mouth daily. 08/11/16   Joycie Peek, PA-C  methocarbamol (ROBAXIN) 500 MG tablet Take 1 tablet (500 mg total) by mouth 2 (two) times daily. 09/06/16   Barrett Henle, PA-C  methylPREDNISolone (MEDROL DOSEPAK) 4 MG TBPK tablet Take 6-5-4-3-2-1 po qd Patient not taking: Reported on 08/11/2016 07/13/16   Deatra Canter, FNP  metroNIDAZOLE (FLAGYL) 500 MG tablet Take 1 tablet (500 mg total) by mouth 2 (two) times daily. 08/11/16   Joycie Peek, PA-C  naproxen (NAPROSYN) 500 MG tablet Take 1 tablet (500 mg total) by mouth 2 (two) times daily. 09/06/16   Barrett Henle, PA-C  promethazine-codeine (PHENERGAN WITH CODEINE) 6.25-10 MG/5ML syrup Take 10 mLs by mouth every 6 (six) hours as needed for cough. Patient not taking: Reported on 08/11/2016 07/07/16   Linna HoffJames D Kindl, MD    Family History Family History  Problem Relation Age of Onset  . Hypertension Mother 9348  . Anxiety disorder Mother   . Hyperlipidemia Father 4342  . Stroke Maternal Grandmother   . Anxiety disorder Sister     Social History Social History  Substance Use Topics  . Smoking status: Never Smoker  . Smokeless tobacco: Never Used  . Alcohol use No     Allergies   Patient has no known allergies.   Review of Systems Review of Systems  Musculoskeletal:  Positive for back pain.  All other systems reviewed and are negative.    Physical Exam Updated Vital Signs BP (!) 105/51 (BP Location: Left Arm)   Pulse 66   Temp 98.4 F (36.9 C) (Oral)   Resp 16   Wt 84.4 kg   LMP 08/30/2016   SpO2 99%   BMI 30.02 kg/m   Physical Exam  Constitutional: She is oriented to person, place, and time. She appears well-developed and well-nourished.  HENT:  Head: Normocephalic.  Eyes: EOM are normal.  Neck: Normal range of motion.  Pulmonary/Chest: Effort normal.  Abdominal: She exhibits no distension.  Musculoskeletal: She exhibits tenderness.  Tender ls spine   Neurological: She is alert and oriented to person, place, and time.  Psychiatric: She has a normal mood and affect.  Nursing note and vitals reviewed.    ED Treatments / Results  Labs (all labs ordered are listed, but only abnormal results are displayed) Labs Reviewed - No data to display  EKG  EKG Interpretation None       Radiology No results found.  Procedures Procedures (including critical care time)  Medications Ordered in ED Medications - No data to display   Initial Impression / Assessment and Plan / ED Course  I have reviewed the triage vital signs and the nursing notes.  Pertinent labs & imaging results that were available during my care of the patient were reviewed by me and considered in my medical decision making (see chart for details).     Meds ordered this encounter  Medications  . diclofenac (VOLTAREN) 50 MG EC tablet    Sig: Take 1 tablet (50 mg total) by mouth 2 (two) times daily.    Dispense:  20 tablet    Refill:  0    Order Specific Question:   Supervising Provider    Answer:   MILLER, BRIAN [[id number]]  . methocarbamol (ROBAXIN) 500 MG tablet    Sig: Take 1 tablet (500 mg total) by mouth 2 (two) times daily.    Dispense:  20 tablet    Refill:  0    Order Specific Question:   Supervising Provider    Answer:   Eber HongMILLER, BRIAN [[id number]]  An After  Visit Summary was printed and given to the patient.   Final Clinical Impressions(s) / ED Diagnoses   Final diagnoses:  Low back pain, unspecified back pain laterality, unspecified chronicity, with sciatica presence unspecified    New Prescriptions New Prescriptions   No medications on file     Elson AreasLeslie K Sofia, PA-C 09/28/16 [id number]    Vanetta MuldersScott Zackowski, MD 10/01/16 0030

## 2016-09-28 NOTE — ED Notes (Signed)
Signature pad does not work

## 2016-10-11 ENCOUNTER — Emergency Department (HOSPITAL_COMMUNITY)
Admission: EM | Admit: 2016-10-11 | Discharge: 2016-10-11 | Disposition: A | Discharge status: Home / Self Care | Attending: Emergency Medicine | Admitting: Emergency Medicine

## 2016-10-11 ENCOUNTER — Encounter (HOSPITAL_COMMUNITY): Payer: Self-pay

## 2016-10-11 DIAGNOSIS — R112 Nausea with vomiting, unspecified: Secondary | ICD-10-CM | POA: Diagnosis not present

## 2016-10-11 DIAGNOSIS — N760 Acute vaginitis: Secondary | ICD-10-CM | POA: Insufficient documentation

## 2016-10-11 DIAGNOSIS — Z202 Contact with and (suspected) exposure to infections with a predominantly sexual mode of transmission: Secondary | ICD-10-CM | POA: Insufficient documentation

## 2016-10-11 DIAGNOSIS — Z709 Sex counseling, unspecified: Secondary | ICD-10-CM | POA: Diagnosis not present

## 2016-10-11 DIAGNOSIS — B9689 Other specified bacterial agents as the cause of diseases classified elsewhere: Secondary | ICD-10-CM

## 2016-10-11 DIAGNOSIS — N898 Other specified noninflammatory disorders of vagina: Secondary | ICD-10-CM | POA: Diagnosis present

## 2016-10-11 LAB — URINALYSIS, ROUTINE W REFLEX MICROSCOPIC
Bilirubin Urine: NEGATIVE
Glucose, UA: NEGATIVE mg/dL
HGB URINE DIPSTICK: NEGATIVE
Ketones, ur: NEGATIVE mg/dL
NITRITE: NEGATIVE
PH: 6 (ref 5.0–8.0)
Protein, ur: NEGATIVE mg/dL
SPECIFIC GRAVITY, URINE: 1.025 (ref 1.005–1.030)

## 2016-10-11 LAB — COMPREHENSIVE METABOLIC PANEL
ALBUMIN: 3.7 g/dL (ref 3.5–5.0)
ALT: 19 U/L (ref 14–54)
ANION GAP: 8 (ref 5–15)
AST: 21 U/L (ref 15–41)
Alkaline Phosphatase: 67 U/L (ref 38–126)
BILIRUBIN TOTAL: 0.2 mg/dL — AB (ref 0.3–1.2)
BUN: 7 mg/dL (ref 6–20)
CO2: 26 mmol/L (ref 22–32)
Calcium: 8.8 mg/dL — ABNORMAL LOW (ref 8.9–10.3)
Chloride: 105 mmol/L (ref 101–111)
Creatinine, Ser: 0.97 mg/dL (ref 0.44–1.00)
Glucose, Bld: 103 mg/dL — ABNORMAL HIGH (ref 65–99)
POTASSIUM: 3.7 mmol/L (ref 3.5–5.1)
Sodium: 139 mmol/L (ref 135–145)
TOTAL PROTEIN: 7.1 g/dL (ref 6.5–8.1)

## 2016-10-11 LAB — CBC
HEMATOCRIT: 38.5 % (ref 36.0–46.0)
HEMOGLOBIN: 12.5 g/dL (ref 12.0–15.0)
MCH: 28.1 pg (ref 26.0–34.0)
MCHC: 32.5 g/dL (ref 30.0–36.0)
MCV: 86.5 fL (ref 78.0–100.0)
PLATELETS: 331 10*3/uL (ref 150–400)
RBC: 4.45 MIL/uL (ref 3.87–5.11)
RDW: 13.9 % (ref 11.5–15.5)
WBC: 9.1 10*3/uL (ref 4.0–10.5)

## 2016-10-11 LAB — WET PREP, GENITAL
SPERM: NONE SEEN
Trich, Wet Prep: NONE SEEN
Yeast Wet Prep HPF POC: NONE SEEN

## 2016-10-11 LAB — I-STAT BETA HCG BLOOD, ED (MC, WL, AP ONLY): I-stat hCG, quantitative: <5 m[IU]/mL (ref <5)

## 2016-10-11 LAB — LIPASE, BLOOD: Lipase: 12 U/L (ref 11–51)

## 2016-10-11 MED ORDER — AZITHROMYCIN 250 MG PO TABS
1000.0000 mg | ORAL_TABLET | Freq: Once | ORAL | Status: AC
  Administered 2016-10-11: 1000 mg via ORAL
  Filled 2016-10-11: qty 4

## 2016-10-11 MED ORDER — METRONIDAZOLE 500 MG PO TABS: 500.0000 mg | ORAL_TABLET | Freq: Two times a day (BID) | ORAL | 0 refills | Status: DC

## 2016-10-11 MED ORDER — LIDOCAINE HCL (PF) 1 % IJ SOLN
INTRAMUSCULAR | Status: AC
  Filled 2016-10-11: qty 5

## 2016-10-11 MED ORDER — CEFTRIAXONE SODIUM 250 MG IJ SOLR
250.0000 mg | Freq: Once | INTRAMUSCULAR | Status: AC
  Administered 2016-10-11: 250 mg via INTRAMUSCULAR
  Filled 2016-10-11: qty 250

## 2016-10-11 NOTE — ED Provider Notes (Signed)
[facility] DEPT Provider Note    By signing my name below, I, Earmon Phoenix, attest that this documentation has been prepared under the direction and in the presence of Lavera Guise, MD. Electronically Signed: Earmon Phoenix, ED Scribe. 10/11/16. 7:50 PM.    History   Chief Complaint Chief Complaint  Patient presents with  . Emesis  . Exposure to STD   HPI  Kelsey Carroll is a 21 y.o. female with PMHx of chronic back pain and depression who presents to the Emergency Department complaining of nonbloody non bilious vomiting (5 episodes) that began earlier this morning. She reports she also wants to be checked for STDs due to vaginal discharge and itching that began three days ago. She reports being sexually active and only uses protection sometimes. She also reports mild nose bleed upon waking sometimes in the morning. She has not done anything her symptoms. There are no modifying factors noted. She denies fever, chills, dysuria, hematuria or urinary frequency, diarrhea. Her last normal bowel movement was yesterday. She denies eating anything last night that could have caused the vomiting.    Past Medical History:  Diagnosis Date  . Chronic back pain   . Obesity     Patient Active Problem List   Diagnosis Date Noted  . Depression 01/14/2013  . Hydradenitis 02/29/2012  . Impaired hearing 02/29/2012  . Obesity (BMI 35.0-39.9 without comorbidity) 02/29/2012    History reviewed. No pertinent surgical history.  OB History    Gravida Para Term Preterm AB Living   1       1     SAB TAB Ectopic Multiple Live Births     1             Home Medications    Prior to Admission medications   Medication Sig Start Date End Date Taking? Authorizing Provider  albuterol (PROVENTIL HFA;VENTOLIN HFA) 108 (90 Base) MCG/ACT inhaler Inhale 2 puffs into the lungs every 4 (four) hours as needed for wheezing or shortness of breath. Patient not taking: Reported on 08/11/2016 07/13/16    Deatra Canter, FNP  azithromycin (ZITHROMAX) 250 MG tablet Take 1 tablet (250 mg total) by mouth daily. Take first 2 tablets together, then 1 every day until finished. Patient not taking: Reported on 08/11/2016 07/13/16   Deatra Canter, FNP  benzonatate (TESSALON) 100 MG capsule Take 2 capsules (200 mg total) by mouth 3 (three) times daily as needed for cough. Patient not taking: Reported on 08/11/2016 07/13/16   Deatra Canter, FNP  clindamycin (CLEOCIN) 150 MG capsule Take 3 capsules (450 mg total) by mouth 3 (three) times daily. 07/30/16   Hannah Muthersbaugh, PA-C  diclofenac (VOLTAREN) 50 MG EC tablet Take 1 tablet (50 mg total) by mouth 2 (two) times daily. 09/28/16   Elson Areas, PA-C  HYDROcodone-acetaminophen (NORCO/VICODIN) 5-325 MG tablet Take 1 tablet by mouth every 6 (six) hours as needed for moderate pain or severe pain. Patient not taking: Reported on 08/11/2016 07/30/16   Dahlia Client Muthersbaugh, PA-C  ipratropium (ATROVENT) 0.06 % nasal spray Place 2 sprays into both nostrils 4 (four) times daily. Patient not taking: Reported on 08/11/2016 07/07/16   Linna Hoff, MD  meloxicam (MOBIC) 7.5 MG tablet Take 2 tablets (15 mg total) by mouth daily. 08/11/16   Joycie Peek, PA-C  methocarbamol (ROBAXIN) 500 MG tablet Take 1 tablet (500 mg total) by mouth 2 (two) times daily. 09/28/16   Elson Areas, PA-C  methylPREDNISolone (MEDROL DOSEPAK)  4 MG TBPK tablet Take 6-5-4-3-2-1 po qd Patient not taking: Reported on 08/11/2016 07/13/16   Deatra Canter, FNP  metroNIDAZOLE (FLAGYL) 500 MG tablet Take 1 tablet (500 mg total) by mouth 2 (two) times daily. 08/11/16   Joycie Peek, PA-C  metroNIDAZOLE (FLAGYL) 500 MG tablet Take 1 tablet (500 mg total) by mouth 2 (two) times daily. 10/11/16   Lavera Guise, MD  naproxen (NAPROSYN) 500 MG tablet Take 1 tablet (500 mg total) by mouth 2 (two) times daily. 09/06/16   Barrett Henle, PA-C  promethazine-codeine (PHENERGAN WITH CODEINE) 6.25-10  MG/5ML syrup Take 10 mLs by mouth every 6 (six) hours as needed for cough. Patient not taking: Reported on 08/11/2016 07/07/16   Linna Hoff, MD    Family History Family History  Problem Relation Age of Onset  . Hypertension Mother 13  . Anxiety disorder Mother   . Hyperlipidemia Father 27  . Stroke Maternal Grandmother   . Anxiety disorder Sister     Social History Social History  Substance Use Topics  . Smoking status: Never Smoker  . Smokeless tobacco: Never Used  . Alcohol use No     Allergies   Patient has no known allergies.   Review of Systems Review of Systems 10/14 systems reviewed and are negative other than those stated in the HPI  Physical Exam Updated Vital Signs BP (!) 115/53 (BP Location: Right Arm)   Pulse 77   Temp 98.9 F (37.2 C) (Oral)   Resp 19   LMP 08/25/2016 (Within Days)   SpO2 100%   Physical Exam Physical Exam  Nursing note and vitals reviewed. Constitutional: Well developed, well nourished, non-toxic, and in no acute distress Head: Normocephalic and atraumatic.  Mouth/Throat: Oropharynx is clear and moist.  Neck: Normal range of motion. Neck supple.  Cardiovascular: Normal rate and regular rhythm.   Pulmonary/Chest: Effort normal and breath sounds normal.  Abdominal: Soft. There is no tenderness. There is no rebound and no guarding.  Musculoskeletal: Normal range of motion.  Neurological: Alert, no facial droop, fluent speech, moves all extremities symmetrically Skin: Skin is warm and dry.  Psychiatric: Cooperative Pelvic: Normal external genitalia. Normal internal genitalia. No discharge. No blood within the vagina. No cervical motion tenderness. No adnexal masses or tenderness.   ED Treatments / Results  DIAGNOSTIC STUDIES: Oxygen Saturation is 100% on RA, normal by my interpretation.   COORDINATION OF CARE: 7:32 PM- Will perform pelvic exam and order STD panel. Will treat for GC/chlamydia. Pt verbalizes understanding and  agrees to plan.  Medications  lidocaine (PF) (XYLOCAINE) 1 % injection (not administered)  cefTRIAXone (ROCEPHIN) injection 250 mg (250 mg Intramuscular Given 10/11/16 2016)  azithromycin (ZITHROMAX) tablet 1,000 mg (1,000 mg Oral Given 10/11/16 2016)    Labs (all labs ordered are listed, but only abnormal results are displayed) Labs Reviewed  WET PREP, GENITAL - Abnormal; Notable for the following:       Result Value   Clue Cells Wet Prep HPF POC PRESENT (*)    WBC, Wet Prep HPF POC MODERATE (*)    All other components within normal limits  COMPREHENSIVE METABOLIC PANEL - Abnormal; Notable for the following:    Glucose, Bld 103 (*)    Calcium 8.8 (*)    Total Bilirubin 0.2 (*)    All other components within normal limits  URINALYSIS, ROUTINE W REFLEX MICROSCOPIC - Abnormal; Notable for the following:    APPearance CLOUDY (*)    Leukocytes, UA  TRACE (*)    Bacteria, UA RARE (*)    Squamous Epithelial / LPF 6-30 (*)    All other components within normal limits  LIPASE, BLOOD  CBC  HIV ANTIBODY (ROUTINE TESTING)  RPR  I-STAT BETA HCG BLOOD, ED ([facility], [facility], [facility] ONLY)  GC/CHLAMYDIA PROBE AMP ([place]) NOT AT [facility]    EKG  EKG Interpretation None       Radiology No results found.  Procedures Pelvic exam Date/Time: 10/11/2016 7:54 PM Performed by: Crista Curb DUO Authorized by: Crista Curb DUO  Consent: Verbal consent obtained. Consent given by: patient Patient understanding: patient states understanding of the procedure being performed Patient consent: the patient's understanding of the procedure matches consent given Procedure consent: procedure consent matches procedure scheduled Relevant documents: relevant documents present and verified Test results: test results available and properly labeled Required items: required blood products, implants, devices, and special equipment available Patient identity confirmed: verbally with patient Local anesthesia used:  no  Anesthesia: Local anesthesia used: no  Sedation: Patient sedated: no Patient tolerance: Patient tolerated the procedure well with no immediate complications    (including critical care time)  Medications Ordered in ED Medications  lidocaine (PF) (XYLOCAINE) 1 % injection (not administered)  cefTRIAXone (ROCEPHIN) injection 250 mg (250 mg Intramuscular Given 10/11/16 2016)  azithromycin (ZITHROMAX) tablet 1,000 mg (1,000 mg Oral Given 10/11/16 2016)     Initial Impression / Assessment and Plan / ED Course  I have reviewed the triage vital signs and the nursing notes.  Pertinent labs & imaging results that were available during my care of the patient were reviewed by me and considered in my medical decision making (see chart for details).     Presenting with nausea and vomiting and STD check. She is well-appearing in no acute distress with normal vital signs. Nausea and vomiting is resolved, and she has been drinking fluids on her own without any difficulty. Has a soft and nontender abdomen. No headaches or abnormal neuro complaints. Do not feel that there is serious cause of those symptoms, is now they are fully resolved. Blood work including CBC, complete metabolic panel, lipase are all reassuring.  Regarding her ST check, no concerns for a TOA or PID on exam. Wet prep is positive for bacterial vaginosis and will treat with course of Flagyl. She did request empiric treatment for GC and chlamydia due to her exposure, and was given ceftriaxone and azithromycin. Basic counseling provided along with resources.  Strict return and follow-up instructions reviewed. She expressed understanding of all discharge instructions and felt comfortable with the plan of care.   Final Clinical Impressions(s) / ED Diagnoses   Final diagnoses:  Possible exposure to STD  Bacterial vaginosis  Non-intractable vomiting with nausea, unspecified vomiting type  Sex counseling    New Prescriptions New  Prescriptions   METRONIDAZOLE (FLAGYL) 500 MG TABLET    Take 1 tablet (500 mg total) by mouth 2 (two) times daily.    I personally performed the services described in this documentation, which was scribed in my presence. The recorded information has been reviewed and is accurate.      Lavera Guise, MD 10/11/16 2106

## 2016-10-11 NOTE — ED Notes (Signed)
Pt verbalized understanding of d/c instructions and has no further questions. Pt is stable, A&Ox4, VSS.  

## 2016-10-11 NOTE — ED Triage Notes (Signed)
Called for triage no response 

## 2016-10-11 NOTE — ED Triage Notes (Signed)
Pt states she woke up with vomiting. Pt also states she has been having recurrent nose bleeds when she wakes up in the morning. Pt would also like STI screening.

## 2016-10-11 NOTE — ED Notes (Signed)
Patient declined blood work for HIV and RPR

## 2016-10-11 NOTE — Discharge Instructions (Signed)
Please take antibiotics for vaginitis.  Please return without fail for worsening symptoms, including fever, intractable vomiting, severe abdominal pain, or any other symptoms concerning to you

## 2016-10-12 LAB — GC/CHLAMYDIA PROBE AMP (~~LOC~~) NOT AT ARMC
Chlamydia: NEGATIVE
Neisseria Gonorrhea: NEGATIVE

## 2016-10-25 ENCOUNTER — Encounter (HOSPITAL_COMMUNITY): Payer: Self-pay | Admitting: Emergency Medicine

## 2016-10-25 ENCOUNTER — Ambulatory Visit (HOSPITAL_COMMUNITY)
Admission: EM | Admit: 2016-10-25 | Discharge: 2016-10-25 | Disposition: A | Discharge status: Home / Self Care | Payer: TRICARE For Life (TFL) | Attending: Internal Medicine | Admitting: Internal Medicine

## 2016-10-25 DIAGNOSIS — S8000XA Contusion of unspecified knee, initial encounter: Secondary | ICD-10-CM

## 2016-10-25 NOTE — ED Provider Notes (Signed)
CSN: [id number]     Arrival date & time 10/25/16  1256 History   None    Chief Complaint  Patient presents with  . Knee Pain   (Consider location/radiation/quality/duration/timing/severity/associated sxs/prior Treatment) The history is provided by the patient. No language interpreter was used.  Knee Pain  Location:  Knee Time since incident:  1 week Knee location:  L knee and R knee Pain details:    Quality:  Aching   Radiates to:  Does not radiate   Timing:  Constant   Progression:  Worsening Chronicity:  New Foreign body present:  No foreign bodies Prior injury to area:  No Relieved by:  Nothing Ineffective treatments:  None tried Risk factors: no concern for non-accidental trauma    Pt reports she hit her knees at work 1 week ago.  Pt complains of pain above knees.  Pt reports soreness today.  Past Medical History:  Diagnosis Date  . Chronic back pain   . Obesity    History reviewed. No pertinent surgical history. Family History  Problem Relation Age of Onset  . Hypertension Mother 104  . Anxiety disorder Mother   . Hyperlipidemia Father 63  . Stroke Maternal Grandmother   . Anxiety disorder Sister    Social History  Substance Use Topics  . Smoking status: Never Smoker  . Smokeless tobacco: Never Used  . Alcohol use No   OB History    Gravida Para Term Preterm AB Living   1       1     SAB TAB Ectopic Multiple Live Births     1           Review of Systems  All other systems reviewed and are negative.   Allergies  Patient has no known allergies.  Home Medications   Prior to Admission medications   Medication Sig Start Date End Date Taking? Authorizing Provider  albuterol (PROVENTIL HFA;VENTOLIN HFA) 108 (90 Base) MCG/ACT inhaler Inhale 2 puffs into the lungs every 4 (four) hours as needed for wheezing or shortness of breath. Patient not taking: Reported on 08/11/2016 07/13/16   Deatra Canter, FNP  azithromycin (ZITHROMAX) 250 MG tablet Take 1 tablet  (250 mg total) by mouth daily. Take first 2 tablets together, then 1 every day until finished. Patient not taking: Reported on 08/11/2016 07/13/16   Deatra Canter, FNP  benzonatate (TESSALON) 100 MG capsule Take 2 capsules (200 mg total) by mouth 3 (three) times daily as needed for cough. Patient not taking: Reported on 08/11/2016 07/13/16   Deatra Canter, FNP  clindamycin (CLEOCIN) 150 MG capsule Take 3 capsules (450 mg total) by mouth 3 (three) times daily. 07/30/16   Hannah Muthersbaugh, PA-C  diclofenac (VOLTAREN) 50 MG EC tablet Take 1 tablet (50 mg total) by mouth 2 (two) times daily. Patient not taking: Reported on 10/25/2016 09/28/16   Elson Areas, PA-C  HYDROcodone-acetaminophen (NORCO/VICODIN) 5-325 MG tablet Take 1 tablet by mouth every 6 (six) hours as needed for moderate pain or severe pain. Patient not taking: Reported on 08/11/2016 07/30/16   Dahlia Client Muthersbaugh, PA-C  ipratropium (ATROVENT) 0.06 % nasal spray Place 2 sprays into both nostrils 4 (four) times daily. Patient not taking: Reported on 08/11/2016 07/07/16   Linna Hoff, MD  meloxicam (MOBIC) 7.5 MG tablet Take 2 tablets (15 mg total) by mouth daily. Patient not taking: Reported on 10/25/2016 08/11/16   Joycie Peek, PA-C  methocarbamol (ROBAXIN) 500 MG tablet Take 1 tablet (  500 mg total) by mouth 2 (two) times daily. Patient not taking: Reported on 10/25/2016 09/28/16   Elson Areas, PA-C  methylPREDNISolone (MEDROL DOSEPAK) 4 MG TBPK tablet Take 6-5-4-3-2-1 po qd Patient not taking: Reported on 08/11/2016 07/13/16   Deatra Canter, FNP  metroNIDAZOLE (FLAGYL) 500 MG tablet Take 1 tablet (500 mg total) by mouth 2 (two) times daily. 08/11/16   Joycie Peek, PA-C  metroNIDAZOLE (FLAGYL) 500 MG tablet Take 1 tablet (500 mg total) by mouth 2 (two) times daily. 10/11/16   Lavera Guise, MD  naproxen (NAPROSYN) 500 MG tablet Take 1 tablet (500 mg total) by mouth 2 (two) times daily. Patient not taking: Reported on 10/25/2016  09/06/16   Barrett Henle, PA-C  promethazine-codeine [facility] WITH CODEINE) 6.25-10 MG/5ML syrup Take 10 mLs by mouth every 6 (six) hours as needed for cough. Patient not taking: Reported on 08/11/2016 07/07/16   Linna Hoff, MD   Meds Ordered and Administered this Visit  Medications - No data to display  BP (!) 112/57 (BP Location: Right Arm)   Pulse 92   Temp 97.6 F (36.4 C) (Oral)   Resp 16   LMP 10/20/2016   SpO2 100%  No data found.   Physical Exam  Constitutional: She appears well-developed and well-nourished.  HENT:  Head: Normocephalic.  Musculoskeletal: Normal range of motion. She exhibits tenderness.  Tender supra patella  From  nv and ns intact, no bruising, no deformity  Neurological: She is alert.  Skin: Skin is warm.  Nursing note and vitals reviewed.   Urgent Care Course     Procedures (including critical care time)  Labs Review Labs Reviewed - No data to display  Imaging Review No results found.   Visual Acuity Review  Right Eye Distance:   Left Eye Distance:   Bilateral Distance:    Right Eye Near:   Left Eye Near:    Bilateral Near:       Ice, Ibuprofen,    MDM   1. Contusion of knee, unspecified laterality, initial encounter    An After Visit Summary was printed and given to the patient.     Lonia Skinner [place], PA-C 10/25/16 1426

## 2016-10-25 NOTE — ED Triage Notes (Signed)
Patient reports a box fell on both needs.  This occurred last week.  Patient states this is not a workers comp case.  Patient reports there was no pain initially

## 2016-10-25 NOTE — Discharge Instructions (Signed)
Ibuprofen for soreness.  Ice to area of pain  

## 2016-12-08 ENCOUNTER — Ambulatory Visit (HOSPITAL_COMMUNITY)
Admission: EM | Admit: 2016-12-08 | Discharge: 2016-12-08 | Disposition: A | Discharge status: Home / Self Care | Payer: TRICARE For Life (TFL) | Attending: Emergency Medicine | Admitting: Emergency Medicine

## 2016-12-08 ENCOUNTER — Encounter (HOSPITAL_COMMUNITY): Payer: Self-pay | Admitting: Emergency Medicine

## 2016-12-08 DIAGNOSIS — R112 Nausea with vomiting, unspecified: Secondary | ICD-10-CM | POA: Diagnosis not present

## 2016-12-08 DIAGNOSIS — R109 Unspecified abdominal pain: Secondary | ICD-10-CM | POA: Diagnosis not present

## 2016-12-08 MED ORDER — ONDANSETRON HCL 4 MG PO TABS: 4.0000 mg | ORAL_TABLET | Freq: Four times a day (QID) | ORAL | 0 refills | Status: DC

## 2016-12-08 MED ORDER — ONDANSETRON 4 MG PO TBDP
4.0000 mg | ORAL_TABLET | Freq: Once | ORAL | Status: AC
  Administered 2016-12-08: 4 mg via ORAL

## 2016-12-08 MED ORDER — ONDANSETRON 4 MG PO TBDP
ORAL_TABLET | ORAL | Status: AC
  Filled 2016-12-08: qty 1

## 2016-12-08 NOTE — ED Provider Notes (Signed)
CSN: [id number]     Arrival date & time 12/08/16  1441 History   None    Chief Complaint  Patient presents with  . Abdominal Cramping   (Consider location/radiation/quality/duration/timing/severity/associated sxs/prior Treatment) The history is provided by the patient. No language interpreter was used.  Abdominal Cramping  This is a new problem. The current episode started 6 to 12 hours ago. The problem has been rapidly improving. Pertinent negatives include no chest pain, no abdominal pain, no headaches and no shortness of breath. Nothing aggravates the symptoms. Nothing relieves the symptoms. She has tried nothing for the symptoms. The treatment provided no relief.    Past Medical History:  Diagnosis Date  . Chronic back pain   . Obesity    History reviewed. No pertinent surgical history. Family History  Problem Relation Age of Onset  . Hypertension Mother 47  . Anxiety disorder Mother   . Hyperlipidemia Father 80  . Stroke Maternal Grandmother   . Anxiety disorder Sister    Social History  Substance Use Topics  . Smoking status: Never Smoker  . Smokeless tobacco: Never Used  . Alcohol use No   OB History    Gravida Para Term Preterm AB Living   1       1     SAB TAB Ectopic Multiple Live Births     1           Review of Systems  Constitutional: Positive for appetite change. Negative for chills and fever.  HENT: Negative.   Respiratory: Negative for shortness of breath.   Cardiovascular: Negative for chest pain.  Gastrointestinal: Positive for nausea and vomiting. Negative for abdominal pain, constipation and diarrhea.  Endocrine: Negative.   Genitourinary: Negative for dysuria, hematuria, vaginal bleeding and vaginal discharge.  Skin: Negative for rash.  Allergic/Immunologic: Negative.   Neurological: Negative.  Negative for headaches.  Hematological: Negative.   Psychiatric/Behavioral: Negative.   All other systems reviewed and are negative.   Allergies   Patient has no known allergies.  Home Medications   Prior to Admission medications   Medication Sig Start Date End Date Taking? Authorizing Provider  ondansetron (ZOFRAN) 4 MG tablet Take 1 tablet (4 mg total) by mouth every 6 (six) hours. 12/08/16   Enis Riecke, Para March, NP   Meds Ordered and Administered this Visit   Medications  ondansetron (ZOFRAN-ODT) disintegrating tablet 4 mg (4 mg Oral Given 12/08/16 1552)    BP (!) 116/51 (BP Location: Right Arm)   Pulse 73   Temp 98.5 F (36.9 C) (Oral)   Resp 18   SpO2 100%  No data found.   Physical Exam  Constitutional: Vital signs are normal. She appears well-developed and well-nourished. She is active and cooperative.  HENT:  Head: Normocephalic.  Right Ear: Tympanic membrane normal.  Left Ear: Tympanic membrane normal.  Nose: Nose normal.  Mouth/Throat: Uvula is midline, oropharynx is clear and moist and mucous membranes are normal.  Neck: Trachea normal.  Cardiovascular: Normal rate, regular rhythm, normal heart sounds and normal pulses.   Pulmonary/Chest: Effort normal and breath sounds normal.  Abdominal: Normal appearance and bowel sounds are normal. There is no tenderness.  Neurological: She is alert. No cranial nerve deficit or sensory deficit. GCS eye subscore is 4. GCS verbal subscore is 5. GCS motor subscore is 6.  Psychiatric: She has a normal mood and affect. Her speech is normal.  Nursing note and vitals reviewed.   Urgent Care Course     Procedures (including  critical care time)  Labs Review Labs Reviewed - No data to display  Imaging Review No results found.        MDM   1. Nausea and vomiting in adult patient    Pt improving after Zofran. Discharged home with scripts. Return to UC as needed, work note given. Bland diet. Pt verbalized understanding to this provider.     Clancy Gourdefelice, Michaelyn Wall, NP 12/08/16 1701

## 2016-12-08 NOTE — Discharge Instructions (Signed)
REST, PUSH FLUIDS,TAKE ZOFRAN, BLAND DIET. GO TO ER FOR NEW OR WORSENING ISSUES

## 2016-12-08 NOTE — ED Triage Notes (Signed)
[facility] with a complaint of abdominal cramping with N/V that started at 7 am this morning.

## 2017-01-17 ENCOUNTER — Ambulatory Visit (HOSPITAL_COMMUNITY)
Admission: EM | Admit: 2017-01-17 | Discharge: 2017-01-17 | Disposition: A | Discharge status: Home / Self Care | Payer: TRICARE For Life (TFL) | Attending: Internal Medicine | Admitting: Internal Medicine

## 2017-01-17 ENCOUNTER — Encounter (HOSPITAL_COMMUNITY): Payer: Self-pay | Admitting: Emergency Medicine

## 2017-01-17 DIAGNOSIS — T148XXA Other injury of unspecified body region, initial encounter: Secondary | ICD-10-CM

## 2017-01-17 DIAGNOSIS — S29019A Strain of muscle and tendon of unspecified wall of thorax, initial encounter: Secondary | ICD-10-CM

## 2017-01-17 MED ORDER — NAPROXEN 375 MG PO TABS: 375.0000 mg | ORAL_TABLET | Freq: Two times a day (BID) | ORAL | 0 refills | Status: DC

## 2017-01-17 NOTE — Discharge Instructions (Signed)
Read instructions accompanying these papers. Use heat as discussed to the area pain, perform stretches daily before work, during a possible and after. Take medication as directed. You may need to change the way you lift and do your work to minimize stress to your back.

## 2017-01-17 NOTE — ED Provider Notes (Signed)
CSN: [id number]     Arrival date & time 01/17/17  1935 History   None    Chief Complaint  Patient presents with  . 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 Denies focal paresthesias or weakness. She states the pain is located between the shoulder blades. No low back pain. Denies any history of trauma or injury.      Past Medical History:  Diagnosis Date  . Chronic back pain   . Obesity    History reviewed. No pertinent surgical history. Family History  Problem Relation Age of Onset  . Hypertension Mother 90  . Anxiety disorder Mother   . Hyperlipidemia Father 61  . Stroke Maternal Grandmother   . Anxiety disorder Sister    Social History  Substance Use Topics  . Smoking status: Never Smoker  . Smokeless tobacco: Never Used  . Alcohol use No   OB History    Gravida Para Term Preterm AB Living   1       1     SAB TAB Ectopic Multiple Live Births     1           Review of Systems  Constitutional: Positive for activity change. Negative for chills and fever.  HENT: Negative.   Respiratory: Negative.   Cardiovascular: Negative.   Musculoskeletal: Positive for back pain and myalgias. Negative for neck pain and neck stiffness.       As per HPI  Skin: Negative for color change, pallor and rash.  Neurological: Negative.   All other systems reviewed and are negative.   Allergies  Patient has no known allergies.  Home Medications   Prior to Admission medications   Medication Sig Start Date End Date Taking? Authorizing Provider  naproxen (NAPROSYN) 375 MG tablet Take 1 tablet (375 mg total) by mouth 2 (two) times daily. 01/17/17   Hayden Rasmussen, NP   Meds Ordered and Administered this Visit  Medications - No data to display  BP 115/62 (BP Location:  Right Arm)   Pulse 87   Temp 98.4 F (36.9 C) (Oral)   Resp 16   SpO2 100%  No data found.   Physical Exam  Constitutional: She is oriented to person, place, and time. She appears well-developed and well-nourished. No distress.  HENT:  Head: Normocephalic and atraumatic.  Eyes: EOM are normal.  Neck: Normal range of motion. Neck supple.  Musculoskeletal: Normal range of motion. She exhibits no edema or deformity.  Tenderness to the parathoracic musculature. Tenderness to the thoracic aspect of the trapezius muscle. Full range of motion of the arms. Able to cross arms tightly, place arms behind back and lift overhead. Tenderness to the muscles mentioned above. No spinal tenderness, deformity or step-off deformity.  Lymphadenopathy:    She has no cervical adenopathy.  Neurological: She is alert and oriented to person, place, and time. No cranial nerve deficit.  Skin: Skin is warm and dry.  Psychiatric: She has a normal mood and affect.  Nursing note and vitals reviewed.   Urgent Care Course     Procedures (including critical care time)  Labs Review Labs Reviewed - No data to display  Imaging Review No results found.   Visual Acuity Review  Right Eye Distance:   Left Eye Distance:   Bilateral Distance:    Right Eye  Near:   Left Eye Near:    Bilateral Near:         MDM   1. Thoracic myofascial strain, initial encounter   2. Muscle strain    Read instructions accompanying these papers. Use heat as discussed to the area pain, perform stretches daily before work, during a possible and after. Take medication as directed. You may need to change the way you lift and do your work to minimize stress to your back. Meds ordered this encounter  Medications  . naproxen (NAPROSYN) 375 MG tablet    Sig: Take 1 tablet (375 mg total) by mouth 2 (two) times daily.    Dispense:  20 tablet    Refill:  0    Order Specific Question:   Supervising Provider    Answer:   Eustace MooreMURRAY,  LAURA W [[id number]]       Hayden RasmussenMabe, Kenesha Moshier, NP 01/17/17 2013

## 2017-01-17 NOTE — ED Triage Notes (Signed)
[facility] a complaint of upper back pain x 4 days that she believed to be due to heavy lifting at work.

## 2017-02-07 ENCOUNTER — Emergency Department (HOSPITAL_COMMUNITY)
Admission: EM | Admit: 2017-02-07 | Discharge: 2017-02-07 | Disposition: A | Discharge status: Home / Self Care | Payer: TRICARE For Life (TFL) | Attending: Emergency Medicine | Admitting: Emergency Medicine

## 2017-02-07 ENCOUNTER — Encounter (HOSPITAL_COMMUNITY): Payer: Self-pay

## 2017-02-07 DIAGNOSIS — Z202 Contact with and (suspected) exposure to infections with a predominantly sexual mode of transmission: Secondary | ICD-10-CM | POA: Insufficient documentation

## 2017-02-07 DIAGNOSIS — R42 Dizziness and giddiness: Secondary | ICD-10-CM

## 2017-02-07 DIAGNOSIS — Z113 Encounter for screening for infections with a predominantly sexual mode of transmission: Secondary | ICD-10-CM

## 2017-02-07 LAB — URINALYSIS, ROUTINE W REFLEX MICROSCOPIC
Bilirubin Urine: NEGATIVE
Glucose, UA: NEGATIVE mg/dL
Hgb urine dipstick: NEGATIVE
Ketones, ur: NEGATIVE mg/dL
LEUKOCYTES UA: NEGATIVE
NITRITE: NEGATIVE
PH: 6 (ref 5.0–8.0)
Protein, ur: NEGATIVE mg/dL
SPECIFIC GRAVITY, URINE: 1.027 (ref 1.005–1.030)

## 2017-02-07 LAB — CBC
HCT: 37.5 % (ref 36.0–46.0)
HEMOGLOBIN: 12.7 g/dL (ref 12.0–15.0)
MCH: 29.3 pg (ref 26.0–34.0)
MCHC: 33.9 g/dL (ref 30.0–36.0)
MCV: 86.4 fL (ref 78.0–100.0)
Platelets: 391 10*3/uL (ref 150–400)
RBC: 4.34 MIL/uL (ref 3.87–5.11)
RDW: 14 % (ref 11.5–15.5)
WBC: 6.9 10*3/uL (ref 4.0–10.5)

## 2017-02-07 LAB — WET PREP, GENITAL
CLUE CELLS WET PREP: NONE SEEN
SPERM: NONE SEEN
TRICH WET PREP: NONE SEEN
YEAST WET PREP: NONE SEEN

## 2017-02-07 LAB — BASIC METABOLIC PANEL
Anion gap: 7 (ref 5–15)
BUN: 15 mg/dL (ref 6–20)
CALCIUM: 9.2 mg/dL (ref 8.9–10.3)
CO2: 25 mmol/L (ref 22–32)
Chloride: 107 mmol/L (ref 101–111)
Creatinine, Ser: 0.98 mg/dL (ref 0.44–1.00)
GLUCOSE: 85 mg/dL (ref 65–99)
Potassium: 4.1 mmol/L (ref 3.5–5.1)
Sodium: 139 mmol/L (ref 135–145)

## 2017-02-07 LAB — POC URINE PREG, ED: PREG TEST UR: NEGATIVE

## 2017-02-07 NOTE — Discharge Instructions (Signed)
Your EKG tonight is normal. Your blood work is normal, you are not anemic. Follow up with your primary care doctor or the neurologist for your feeling of light headed. Follow up with the health department for any additional STD screening.  Return here as needed.

## 2017-02-07 NOTE — ED Notes (Signed)
Pt given Sprite per Chelsea(RN)

## 2017-02-07 NOTE — ED Triage Notes (Signed)
Pt reports dizzinees for three weeks and reports feeling faint on the job, sts also wants an STD check to have "clean slate" with new partner. Also requests a pregnancy test. Last normal period was start of June.

## 2017-02-07 NOTE — ED Provider Notes (Signed)
[facility]-EMERGENCY DEPT Provider Note   CSN: [id number] Arrival date & time: 02/07/17  1522   By signing my name below, I, Clarisse GougeXavier Herndon, attest that this documentation has been prepared under the direction and in the presence of [facility] Neese, FNP. Electronically signed, Clarisse GougeXavier Herndon, ED Scribe. 02/07/17. 6:06 PM.   History   Chief Complaint Chief Complaint  Patient presents with  . Dizziness  . Near Syncope  . Exposure to STD   The history is provided by the patient and medical records. No language interpreter was used.    Kelsey RochBrianna L Carroll is a 21 y.o. female presenting to the Emergency Department concerning persistent lightheadedness x 3 weeks. Associated fatigue, weakness, dizziness noted that are all worst in the mornings. She states she drinks 1 large bottled water daily and als also drinks juices and sodas. H/o anemia noted. No SOB, HA, fever, neck pain, N/V/D, chest pain, abdominal pain, dysuria, ear ache, frequency, sore throat or any other complaints noted at this time.   Pt also requesting STD testing, stating she expects to become sexually involved with a new partner soon. No suspicion of pregnancy. Pt notes contact with 1 female partner x 2 years without barrier most recently, and no sexual contact x ~3 months. No PCP noted.  Past Medical History:  Diagnosis Date  . Chronic back pain   . Obesity     Patient Active Problem List   Diagnosis Date Noted  . Depression 01/14/2013  . Hydradenitis 02/29/2012  . Impaired hearing 02/29/2012  . Obesity (BMI 35.0-39.9 without comorbidity) 02/29/2012    History reviewed. No pertinent surgical history.  OB History    Gravida Para Term Preterm AB Living   1       1     SAB TAB Ectopic Multiple Live Births     1             Home Medications    Prior to Admission medications   Medication Sig Start Date End Date Taking? Authorizing Provider  naproxen (NAPROSYN) 375 MG tablet Take 1 tablet (375 mg total) by mouth 2 (two)  times daily. 01/17/17   Hayden RasmussenMabe, David, NP    Family History Family History  Problem Relation Age of Onset  . Hypertension Mother 6548  . Anxiety disorder Mother   . Hyperlipidemia Father 7242  . Stroke Maternal Grandmother   . Anxiety disorder Sister     Social History Social History  Substance Use Topics  . Smoking status: Never Smoker  . Smokeless tobacco: Never Used  . Alcohol use No     Allergies   Patient has no known allergies.   Review of Systems Review of Systems  Constitutional: Positive for fatigue. Negative for fever.  HENT: Negative for ear pain.   Eyes: Negative for photophobia and visual disturbance.  Respiratory: Negative for shortness of breath.   Cardiovascular: Negative for chest pain.  Gastrointestinal: Negative for abdominal pain, diarrhea, nausea and vomiting.  Genitourinary: Negative for dysuria, frequency, vaginal bleeding and vaginal discharge.  Musculoskeletal: Negative for neck pain.  Skin: Negative for color change and wound.  Allergic/Immunologic: Negative for immunocompromised state.  Neurological: Positive for dizziness and light-headedness. Negative for syncope and headaches.  Hematological: Does not bruise/bleed easily.     Physical Exam Updated Vital Signs BP 118/63 (BP Location: Right Arm)   Pulse 73   Temp 98.3 F (36.8 C) (Oral)   Resp 16   Ht 5\' 6"  (1.676 m)   Wt 189  lb (85.7 kg)   SpO2 100%   BMI 30.51 kg/m   Physical Exam  Constitutional: She is oriented to person, place, and time. She appears well-developed and well-nourished. No distress.  HENT:  Head: Normocephalic and atraumatic.  Right Ear: Tympanic membrane normal.  Left Ear: Tympanic membrane normal.  Nose: Nose normal.  Mouth/Throat: Uvula is midline, oropharynx is clear and moist and mucous membranes are normal.  Eyes: Pupils are equal, round, and reactive to light. Conjunctivae and EOM are normal.  Neck: Normal range of motion. Neck supple.  No meningeal signs.  FROM of the neck without pain.  Cardiovascular: Normal rate and intact distal pulses.   Pulmonary/Chest: Effort normal and breath sounds normal.  Lungs clear  Abdominal: Soft. Bowel sounds are normal. There is no tenderness.  Genitourinary:  Genitourinary Comments: Patient collected vaginal swabs for culture and wet prep.  Musculoskeletal: Normal range of motion. She exhibits no edema.  Pedal pulses 2+. No lower extremity edema.  Neurological: She is alert and oriented to person, place, and time. She has normal strength. No cranial nerve deficit or sensory deficit. She displays a negative Romberg sign. Gait normal.  Reflex Scores:      Bicep reflexes are 2+ on the right side and 2+ on the left side.      Brachioradialis reflexes are 2+ on the right side and 2+ on the left side.      Patellar reflexes are 2+ on the right side and 2+ on the left side. Grips equal. Patellar reflex 2+. Negative romberg. Steady gait. No foot drag. Stands on one foot without difficulty.  Skin: No rash noted.  Psychiatric: She has a normal mood and affect. Her behavior is normal.  Nursing note and vitals reviewed.    ED Treatments / Results  DIAGNOSTIC STUDIES: Oxygen Saturation is 100% on RA, NL by my interpretation.    COORDINATION OF CARE: 5:28 PM-Discussed next steps with pt. Pt verbalized understanding and is agreeable with the plan. Will order blood work and labs.   Labs (all labs ordered are listed, but only abnormal results are displayed) Labs Reviewed  WET PREP, GENITAL - Abnormal; Notable for the following:       Result Value   WBC, Wet Prep HPF POC MODERATE (*)    All other components within normal limits  BASIC METABOLIC PANEL  CBC  URINALYSIS, ROUTINE W REFLEX MICROSCOPIC  RPR  HIV ANTIBODY (ROUTINE TESTING)  POC URINE PREG, ED  GC/CHLAMYDIA PROBE AMP ([facility]) NOT AT [facility]    EKG  EKG Interpretation  Date/Time:  Monday February 07 2017 19:24:47 EDT Ventricular Rate:  57 PR  Interval:  188 QRS Duration: 86 QT Interval:  402 QTC Calculation: 391 R Axis:   83 Text Interpretation:  Sinus bradycardia with sinus arrhythmia Otherwise normal ECG no wpw prolonged qt or brugada No old tracing to compare Confirmed by Melene PlanFloyd, Dan [phone]([id number]) on 02/07/2017 7:29:45 PM Also confirmed by Melene PlanFloyd, Dan [phone]), editor Elita QuickWatlington, Beverly ([id number])  on 02/08/2017 8:46:58 AM       Radiology No results found.  Procedures Procedures (including critical care time)  Medications Ordered in ED Medications - No data to display   Initial Impression / Assessment and Plan / ED Course  I have reviewed the triage vital signs and the nursing notes.  Pertinent labs & imaging results that were available during my care of the patient were reviewed by me and considered in my medical decision making (see chart for details).  21 y.o. female with episodes of feeling light headed stable for d/c with normal neuro exam and normal labs. Discussed need for f/u with PCP or neurologist.  I discussed this case with Dr. Adela Lank and he read the EKG.    Patient also request asymptomatic STD check. Initial testing is negative for trichomonas. Discussed safe sexual practices. Pt is advised to follow up for free testing at local health department in the future. Pt appears safe for discharge.    Final Clinical Impressions(s) / ED Diagnoses   Final diagnoses:  Light headedness  Screening for STDs (sexually transmitted diseases)    New Prescriptions Discharge Medication List as of 02/07/2017  7:44 PM    I personally performed the services described in this documentation, which was scribed in my presence. The recorded information has been reviewed and is accurate.    Kerrie [place], NP 02/08/17 1800    Melene Plan, DO 02/09/17 [phone]

## 2017-02-08 LAB — GC/CHLAMYDIA PROBE AMP (~~LOC~~) NOT AT ARMC
CHLAMYDIA, DNA PROBE: NEGATIVE
NEISSERIA GONORRHEA: NEGATIVE

## 2017-02-08 LAB — RPR: RPR Ser Ql: NONREACTIVE

## 2017-02-08 LAB — HIV ANTIBODY (ROUTINE TESTING W REFLEX): HIV Screen 4th Generation wRfx: NONREACTIVE

## 2017-03-02 ENCOUNTER — Encounter (HOSPITAL_COMMUNITY): Payer: Self-pay | Admitting: Emergency Medicine

## 2017-03-02 ENCOUNTER — Ambulatory Visit (HOSPITAL_COMMUNITY)
Admission: EM | Admit: 2017-03-02 | Discharge: 2017-03-02 | Disposition: A | Discharge status: Home / Self Care | Payer: TRICARE For Life (TFL) | Attending: Family Medicine | Admitting: Family Medicine

## 2017-03-02 DIAGNOSIS — S39012A Strain of muscle, fascia and tendon of lower back, initial encounter: Secondary | ICD-10-CM

## 2017-03-02 MED ORDER — NAPROXEN 500 MG PO TABS
500.0000 mg | ORAL_TABLET | Freq: Two times a day (BID) | ORAL | 0 refills | Status: DC
Start: 2017-03-02 — End: 2017-03-21

## 2017-03-02 MED ORDER — CYCLOBENZAPRINE HCL 10 MG PO TABS: 10.0000 mg | ORAL_TABLET | Freq: Two times a day (BID) | ORAL | 0 refills | Status: DC | PRN

## 2017-03-02 NOTE — ED Provider Notes (Signed)
  [facility]   [id number] 03/02/17 Arrival Time: 1228  ASSESSMENT & PLAN:  1. Strain of lumbar region, initial encounter     Meds ordered this encounter  Medications  . naproxen (NAPROSYN) 500 MG tablet    Sig: Take 1 tablet (500 mg total) by mouth 2 (two) times daily with a meal.    Dispense:  20 tablet    Refill:  0    Order Specific Question:   Supervising Provider    AnswerMardella Layman [[id number]]  . cyclobenzaprine (FLEXERIL) 10 MG tablet    Sig: Take 1 tablet (10 mg total) by mouth 2 (two) times daily as needed for muscle spasms.    Dispense:  20 tablet    Refill:  0    Order Specific Question:   Supervising Provider    Answer:   Mardella Layman [[id number]]    Reviewed expectations re: course of current medical issues. Questions answered. Outlined signs and symptoms indicating need for more acute intervention. Patient verbalized understanding. After Visit Summary given.   SUBJECTIVE:  Kelsey Carroll is a 21 y.o. female who presents with complaint of right lower back pain  ROS: As per HPI.   OBJECTIVE:  Vitals:   03/02/17 1322  BP: 108/60  Pulse: 60  Temp: 98.1 F (36.7 C)  TempSrc: Oral  SpO2: 100%     General appearance: alert; no distress Eyes: PERRLA; EOMI; conjunctiva normal HENT: normocephalic; atraumatic; Lungs: clear to auscultation bilaterally Heart: regular rate and rhythm Abdomen: soft, non-tender; bowel sounds normal; no masses or organomegaly; no guarding or rebound tenderness Back: Tenderness right lumbar spine Extremities: no cyanosis or edema; symmetrical with no gross deformities Neurologic: normal gait; normal symmetric reflexes Psychological: alert and cooperative; normal mood and affect  Past Medical History:  Diagnosis Date  . Chronic back pain   . Obesity      has a past medical history of Chronic back pain and Obesity.    Labs Reviewed - No data to display  Imaging: No results found.  No Known  Allergies  Family History  Problem Relation Age of Onset  . Hypertension Mother 52  . Anxiety disorder Mother   . Hyperlipidemia Father 56  . Stroke Maternal Grandmother   . Anxiety disorder Sister    History reviewed. No pertinent surgical history.       Deatra Canter, FNP 03/02/17 1359

## 2017-03-02 NOTE — ED Triage Notes (Signed)
Pt has been having recurrent mid lower back pain for 5-6 months.  She denies any injury to the back.  She has been treated several times for this, and states that muscle relaxer's help her the best.

## 2017-03-21 ENCOUNTER — Encounter (HOSPITAL_COMMUNITY): Payer: Self-pay | Admitting: Emergency Medicine

## 2017-03-21 ENCOUNTER — Ambulatory Visit (HOSPITAL_COMMUNITY)
Admission: EM | Admit: 2017-03-21 | Discharge: 2017-03-21 | Disposition: A | Discharge status: Home / Self Care | Payer: TRICARE For Life (TFL) | Attending: Urgent Care | Admitting: Urgent Care

## 2017-03-21 DIAGNOSIS — L089 Local infection of the skin and subcutaneous tissue, unspecified: Secondary | ICD-10-CM | POA: Diagnosis not present

## 2017-03-21 DIAGNOSIS — M79621 Pain in right upper arm: Secondary | ICD-10-CM

## 2017-03-21 MED ORDER — CEPHALEXIN 500 MG PO CAPS: 500.0000 mg | ORAL_CAPSULE | Freq: Three times a day (TID) | ORAL | 0 refills | Status: DC

## 2017-03-21 NOTE — ED Provider Notes (Signed)
  MRN: [id number] DOB: 1996/01/25  Subjective:   Kelsey Carroll is a 21 y.o. female presenting for chief complaint of Abscess  Reports 3 day history of right arm pit pain, swelling. Pain is elicited with touch, constant, achy in nature. Has had this issue before resolved with Epsom salts, warm compresses, peroxide. Denies fever, drainage, redness.   Kelsey Carroll is not currently taking any medications and also has No Known Allergies.  Kelsey Carroll  has a past medical history of Chronic back pain and Obesity. Also denies past surgical history.  Objective:   Vitals: BP 109/62   Pulse 86   Temp 98.7 F (37.1 C) (Oral)   Resp 16   Ht 5\' 5"  (1.651 m)   Wt 190 lb (86.2 kg)   LMP 03/17/2017   SpO2 100%   BMI 31.62 kg/m   Physical Exam  Constitutional: She is oriented to person, place, and time. She appears well-developed and well-nourished.  Cardiovascular: Normal rate.   Pulmonary/Chest: Effort normal.  Neurological: She is alert and oriented to person, place, and time.  Skin: Skin is warm and dry.      Assessment and Plan :   Superficial skin infection  Pain in right axilla  Patient refused incision and drainage. I actually agree. I do not suspect that it is medically necessary, patient will start Keflex and use warm compresses TID. Return-to-clinic precautions discussed, patient verbalized understanding.   Wallis BambergMario Eyob Godlewski, PA-C [facility]  03/21/2017  6:42 PM    Wallis BambergMani, Talita Recht, PA-C 03/21/17 1858

## 2017-03-21 NOTE — Discharge Instructions (Signed)
If your skin infection becomes red, more painful and swollen or you get fever, nausea, vomiting despite taking an antibiotic and using warm compresses, you need to return to our clinic for a recheck and possible incision and drainage.

## 2017-03-21 NOTE — ED Triage Notes (Signed)
PT reports recurrent abscess under right arm. This episode has gone on for 3 days

## 2017-07-11 ENCOUNTER — Encounter (HOSPITAL_COMMUNITY): Payer: Self-pay

## 2017-07-11 ENCOUNTER — Emergency Department (HOSPITAL_COMMUNITY)
Admission: EM | Admit: 2017-07-11 | Discharge: 2017-07-11 | Disposition: A | Discharge status: Home / Self Care | Payer: Self-pay | Attending: Emergency Medicine | Admitting: Emergency Medicine

## 2017-07-11 ENCOUNTER — Other Ambulatory Visit: Payer: Self-pay

## 2017-07-11 DIAGNOSIS — J020 Streptococcal pharyngitis: Secondary | ICD-10-CM | POA: Insufficient documentation

## 2017-07-11 LAB — RAPID STREP SCREEN (MED CTR MEBANE ONLY): Streptococcus, Group A Screen (Direct): POSITIVE — AB

## 2017-07-11 MED ORDER — IBUPROFEN 400 MG PO TABS
800.0000 mg | ORAL_TABLET | Freq: Once | ORAL | Status: AC
  Administered 2017-07-11: 800 mg via ORAL
  Filled 2017-07-11: qty 2

## 2017-07-11 MED ORDER — AMOXICILLIN 875 MG PO TABS: 875.0000 mg | ORAL_TABLET | Freq: Two times a day (BID) | ORAL | 0 refills | Status: AC

## 2017-07-11 MED ORDER — IBUPROFEN 800 MG PO TABS: 800.0000 mg | ORAL_TABLET | Freq: Three times a day (TID) | ORAL | 0 refills | Status: DC | PRN

## 2017-07-11 NOTE — ED Triage Notes (Signed)
Pt presents for evaluation of sore throat and URI symptoms since Saturday. Pt reports no improvement.

## 2017-07-11 NOTE — Discharge Instructions (Signed)
Follow up with your doctor for persistent symptoms.  Return to ED for worsening in any way. °

## 2017-07-11 NOTE — ED Provider Notes (Signed)
[facility] Provider Note   CSN: [id number] Arrival date & time: 07/11/17  [id number]     History   Chief Complaint No chief complaint on file.   HPI Kelsey Carroll is a 21 y.o. female.  Patient reports sore throat and bilateral ear pain x 3 days.  No known fever.  Tolerating PO.  No relevant PMHx.  The history is provided by the patient. No language interpreter was used.  Sore Throat  This is a new problem. The current episode started in the past 7 days. The problem occurs constantly. The problem has been unchanged. Associated symptoms include a sore throat. Pertinent negatives include no fever or vomiting. The symptoms are aggravated by swallowing. She has tried nothing for the symptoms.    Past Medical History:  Diagnosis Date  . Chronic back pain   . Obesity     Patient Active Problem List   Diagnosis Date Noted  . Depression 01/14/2013  . Hydradenitis 02/29/2012  . Impaired hearing 02/29/2012  . Obesity (BMI 35.0-39.9 without comorbidity) 02/29/2012    History reviewed. No pertinent surgical history.  OB History    Gravida Para Term Preterm AB Living   1       1     SAB TAB Ectopic Multiple Live Births     1             Home Medications    Prior to Admission medications   Medication Sig Start Date End Date Taking? Authorizing Provider  amoxicillin (AMOXIL) 875 MG tablet Take 1 tablet (875 mg total) by mouth 2 (two) times daily for 10 days. 07/11/17 07/21/17  Lowanda FosterBrewer, Bobbye Petti, NP  cephALEXin (KEFLEX) 500 MG capsule Take 1 capsule (500 mg total) by mouth 3 (three) times daily with meals. 03/21/17   Wallis BambergMani, Mario, PA-C  ibuprofen (ADVIL,MOTRIN) 800 MG tablet Take 1 tablet (800 mg total) by mouth every 8 (eight) hours as needed for moderate pain. 07/11/17   Lowanda FosterBrewer, Rutherford Alarie, NP    Family History Family History  Problem Relation Age of Onset  . Hypertension Mother 2148  . Anxiety disorder Mother   . Hyperlipidemia Father 3142  .  Stroke Maternal Grandmother   . Anxiety disorder Sister     Social History Social History   Tobacco Use  . Smoking status: Never Smoker  . Smokeless tobacco: Never Used  Substance Use Topics  . Alcohol use: No  . Drug use: Yes    Types: Marijuana     Allergies   Patient has no known allergies.   Review of Systems Review of Systems  Constitutional: Negative for fever.  HENT: Positive for sore throat.   Gastrointestinal: Negative for vomiting.  All other systems reviewed and are negative.    Physical Exam Updated Vital Signs BP (!) 115/15 (BP Location: Right Arm)   Pulse 76   Temp 98.7 F (37.1 C) (Oral)   Resp 16   Ht 5\' 6"  (1.676 m)   Wt 99.3 kg (219 lb)   LMP 07/02/2017 (Exact Date)   SpO2 100%   BMI 35.35 kg/m   Physical Exam  Constitutional: She is oriented to person, place, and time. Vital signs are normal. She appears well-developed and well-nourished. She is active and cooperative.  Non-toxic appearance. No distress.  HENT:  Head: Normocephalic and atraumatic.  Right Ear: Tympanic membrane, external ear and ear canal normal.  Left Ear: Tympanic membrane, external ear and ear canal normal.  Nose: Nose normal.  Mouth/Throat: Uvula is midline and mucous membranes are normal. No uvula swelling. Oropharyngeal exudate and posterior oropharyngeal erythema present. No tonsillar abscesses.  Eyes: EOM are normal. Pupils are equal, round, and reactive to light.  Neck: Trachea normal and normal range of motion. Neck supple.  Cardiovascular: Normal rate, regular rhythm, normal heart sounds, intact distal pulses and normal pulses.  Pulmonary/Chest: Effort normal and breath sounds normal. No respiratory distress.  Abdominal: Soft. Normal appearance and bowel sounds are normal. She exhibits no distension and no mass. There is no hepatosplenomegaly. There is no tenderness.  Musculoskeletal: Normal range of motion.  Neurological: She is alert and oriented to person,  place, and time. She has normal strength. No cranial nerve deficit or sensory deficit. Coordination normal.  Skin: Skin is warm, dry and intact. No rash noted.  Psychiatric: She has a normal mood and affect. Her behavior is normal. Judgment and thought content normal.  Nursing note and vitals reviewed.    ED Treatments / Results  Labs (all labs ordered are listed, but only abnormal results are displayed) Labs Reviewed  RAPID STREP SCREEN (NOT AT [facility]) - Abnormal; Notable for the following components:      Result Value   Streptococcus, Group A Screen (Direct) POSITIVE (*)    All other components within normal limits    EKG  EKG Interpretation None       Radiology No results found.  Procedures Procedures (including critical care time)  Medications Ordered in ED Medications  ibuprofen (ADVIL,MOTRIN) tablet 800 mg (800 mg Oral Given 07/11/17 1046)     Initial Impression / Assessment and Plan / ED Course  I have reviewed the triage vital signs and the nursing notes.  Pertinent labs & imaging results that were available during my care of the patient were reviewed by me and considered in my medical decision making (see chart for details).     [age]y female with sore throat and bilateral ear pain x 3 days, no known fever.  On exam, pharynx erythematous with tonsillar exudate and petechiae to posterior palate.  Strep screen positive.  Will d/c home with Rx for amoxicillin and Ibuprofen for pain.  Strict return precautions provided.  Final Clinical Impressions(s) / ED Diagnoses   Final diagnoses:  Strep pharyngitis    ED Discharge Orders        Ordered    amoxicillin (AMOXIL) 875 MG tablet  2 times daily     07/11/17 1049    ibuprofen (ADVIL,MOTRIN) 800 MG tablet  Every 8 hours PRN     07/11/17 1049       Lowanda FosterBrewer, Nawal Burling, NP 07/11/17 1057    Blane OharaZavitz, Joshua, MD 07/12/17 1102

## 2017-09-12 ENCOUNTER — Encounter (HOSPITAL_COMMUNITY): Payer: Self-pay

## 2017-09-12 ENCOUNTER — Other Ambulatory Visit: Payer: Self-pay

## 2017-09-12 ENCOUNTER — Emergency Department (HOSPITAL_COMMUNITY)
Admission: EM | Admit: 2017-09-12 | Discharge: 2017-09-12 | Disposition: A | Discharge status: Home / Self Care | Payer: Self-pay | Attending: Emergency Medicine | Admitting: Emergency Medicine

## 2017-09-12 DIAGNOSIS — B9789 Other viral agents as the cause of diseases classified elsewhere: Secondary | ICD-10-CM

## 2017-09-12 DIAGNOSIS — J069 Acute upper respiratory infection, unspecified: Secondary | ICD-10-CM | POA: Insufficient documentation

## 2017-09-12 DIAGNOSIS — Z79899 Other long term (current) drug therapy: Secondary | ICD-10-CM | POA: Insufficient documentation

## 2017-09-12 MED ORDER — CETIRIZINE-PSEUDOEPHEDRINE ER 5-120 MG PO TB12: 1.0000 | ORAL_TABLET | Freq: Every day | ORAL | 0 refills | Status: AC

## 2017-09-12 MED ORDER — FLUTICASONE PROPIONATE 50 MCG/ACT NA SUSP: 2.0000 | Freq: Every day | NASAL | 0 refills | Status: DC

## 2017-09-12 NOTE — Discharge Instructions (Signed)
Continue to stay well-hydrated. Gargle warm salt water and spit it out for sore throat. May also use cough drops, warm teas, etc. Take flonase to decrease nasal congestion. Zyrtec for nasal congestion and scratchy throat. Alternate 600 mg of ibuprofen and [phone] mg of Tylenol every 3 hours as needed for pain. Do not exceed 4000 mg of Tylenol daily.   Followup with your primary care doctor in 3-5 days for recheck of ongoing symptoms. Return to emergency department for emergent changing or worsening of symptoms such as throat tightness, facial swelling, fever not controlled by ibuprofen or Tylenol,difficulty breathing, or chest pain.

## 2017-09-12 NOTE — ED Provider Notes (Signed)
[facility]   CSN: [id number] Arrival date & time: 09/12/17  1639     History   Chief Complaint Chief Complaint  Patient presents with  . URI    HPI Kelsey Carroll is a 22 y.o. female presents today for evaluation of nasal congestion, sore throat, and nonproductive cough for 3 days.  She notes that her symptoms are worst in the mornings and at night and she will wake up in the middle the night with sore throat.  She notes that throughout the day she "feels fine ".  She will have intermittent mild frontal aching headaches which she states are similar to headaches she has had in the past.  She denies fevers.  She denies chest pain or shortness of breath.  She has been using Tylenol and warm teas with temporary relief.  She notes her mother has similar symptoms.  She notes that she did get her wisdom teeth removed 2 weeks ago but that her pain has been steadily improving and that she has only been taking Tylenol a few times daily for her pain with improvement.  No facial swelling or abnormal drainage.  She has been tolerating p.o. food and fluids without difficulty.  The history is provided by the patient.    Past Medical History:  Diagnosis Date  . Chronic back pain   . Obesity     Patient Active Problem List   Diagnosis Date Noted  . Depression 01/14/2013  . Hydradenitis 02/29/2012  . Impaired hearing 02/29/2012  . Obesity (BMI 35.0-39.9 without comorbidity) 02/29/2012    History reviewed. No pertinent surgical history.  OB History    Gravida Para Term Preterm AB Living   1       1     SAB TAB Ectopic Multiple Live Births     1             Home Medications    Prior to Admission medications   Medication Sig Start Date End Date Taking? Authorizing Provider  cephALEXin (KEFLEX) 500 MG capsule Take 1 capsule (500 mg total) by mouth 3 (three) times daily with meals. 03/21/17   Wallis BambergMani, Mario, PA-C    cetirizine-pseudoephedrine (ZYRTEC-D) 5-120 MG tablet Take 1 tablet by mouth daily for 5 days. 09/12/17 09/17/17  Michela PitcherFawze, Lineth Thielke A, PA-C  fluticasone (FLONASE) 50 MCG/ACT nasal spray Place 2 sprays into both nostrils daily. 09/12/17   Medardo Hassing A, PA-C  ibuprofen (ADVIL,MOTRIN) 800 MG tablet Take 1 tablet (800 mg total) by mouth every 8 (eight) hours as needed for moderate pain. 07/11/17   Lowanda FosterBrewer, Mindy, NP  norethindrone-ethinyl estradiol (OVCON-35, 28,) 0.4-35 MG-MCG tablet Take 1 tablet by mouth daily. Patient not taking: Reported on 11/05/2015 10/05/13 11/28/15  Antionette CharJackson-Moore, Lisa, MD    Family History Family History  Problem Relation Age of Onset  . Hypertension Mother 5648  . Anxiety disorder Mother   . Hyperlipidemia Father 2042  . Stroke Maternal Grandmother   . Anxiety disorder Sister     Social History Social History   Tobacco Use  . Smoking status: Never Smoker  . Smokeless tobacco: Never Used  Substance Use Topics  . Alcohol use: No  . Drug use: Yes    Types: Marijuana     Allergies   Patient has no known allergies.   Review of Systems Review of Systems  Constitutional: Negative for chills and fever.  HENT: Positive for congestion and sore throat. Negative for drooling, trouble  swallowing and voice change.   Respiratory: Positive for cough. Negative for shortness of breath.   Cardiovascular: Negative for chest pain.  Neurological: Positive for headaches.     Physical Exam Updated Vital Signs BP 133/85 (BP Location: Right Arm)   Pulse 67   Temp 98.2 F (36.8 C) (Oral)   Resp 16   Ht 5\' 6"  (1.676 m)   Wt 99.3 kg (219 lb)   LMP 08/21/2017 (Approximate)   SpO2 98%   BMI 35.35 kg/m   Physical Exam  Constitutional: She appears well-developed and well-nourished. No distress.  HENT:  Head: Normocephalic and atraumatic.  Right Ear: External ear normal.  Left Ear: External ear normal.  Mouth/Throat: Oropharynx is clear and moist.  TMs without erythema or  bulging bilaterally.  Middle ear effusion noted bilaterally.  No frontal or macular sinus tenderness.  Nasal septum midline without mucosal edema.  Postnasal drip noted.  Posterior oropharynx without erythema, tonsillar hypertrophy, exudates, or uvular deviation.  No trismus or sublingual abnormalities.  Tolerating secretions without difficulty.  Eyes: Conjunctivae and EOM are normal. Pupils are equal, round, and reactive to light. Right eye exhibits no discharge. Left eye exhibits no discharge.  Neck: Normal range of motion. Neck supple. No JVD present. No tracheal deviation present.  Cardiovascular: Normal rate, regular rhythm and normal heart sounds.  Pulmonary/Chest: Effort normal and breath sounds normal. She exhibits no tenderness.  Abdominal: Soft. Bowel sounds are normal. She exhibits no distension.  Musculoskeletal: She exhibits no edema.  Lymphadenopathy:    She has no cervical adenopathy.  Neurological: She is alert.  Skin: Skin is warm and dry. No erythema.  Psychiatric: She has a normal mood and affect. Her behavior is normal.  Nursing note and vitals reviewed.    ED Treatments / Results  Labs (all labs ordered are listed, but only abnormal results are displayed) Labs Reviewed - No data to display  EKG  EKG Interpretation None       Radiology No results found.  Procedures Procedures (including critical care time)  Medications Ordered in ED Medications - No data to display   Initial Impression / Assessment and Plan / ED Course  I have reviewed the triage vital signs and the nursing notes.  Pertinent labs & imaging results that were available during my care of the patient were reviewed by me and considered in my medical decision making (see chart for details).     Patient with URI type symptoms for 3 days.  Afebrile, vital signs are stable.  Presentation is not concerning for meningitis, PTA, strep pharyngitis, or Ludwig's angina.  No chest pain or shortness of  breath.  Lungs are clear to auscultation bilaterally, doubt pneumonia or other acute cardiopulmonary abnormality. Patients symptoms are consistent with URI, likely viral etiology. Discussed that antibiotics are not indicated for viral infections. Pt will be discharged with symptomatic treatment.  Recommend follow-up with primary care physician if symptoms persist.  Discussed indications for return to the ED. Pt verbalized understanding of and agreement with plan and is safe for discharge home at this time. No complaints prior to discharge.   Final Clinical Impressions(s) / ED Diagnoses   Final diagnoses:  Viral URI with cough    ED Discharge Orders        Ordered    fluticasone (FLONASE) 50 MCG/ACT nasal spray  Daily     09/12/17 1805    cetirizine-pseudoephedrine (ZYRTEC-D) 5-120 MG tablet  Daily     09/12/17 1805  Jeanie Sewer, PA-C 09/12/17 1807    Melene Plan, DO 09/12/17 Babette Relic

## 2017-09-12 NOTE — ED Triage Notes (Signed)
Pt endorses cold sx x 3 days with sore throat and cough. Denies fever. VSS

## 2017-09-12 NOTE — ED Notes (Signed)
Pt reports headache, sore throat, cough, sneezing and nasal congestion x 3-4 days

## 2018-01-11 ENCOUNTER — Emergency Department (HOSPITAL_COMMUNITY)
Admission: EM | Admit: 2018-01-11 | Discharge: 2018-01-11 | Disposition: A | Discharge status: Home / Self Care | Payer: Self-pay | Attending: Emergency Medicine | Admitting: Emergency Medicine

## 2018-01-11 ENCOUNTER — Encounter (HOSPITAL_COMMUNITY): Payer: Self-pay | Admitting: Emergency Medicine

## 2018-01-11 DIAGNOSIS — H9201 Otalgia, right ear: Secondary | ICD-10-CM

## 2018-01-11 DIAGNOSIS — Z79899 Other long term (current) drug therapy: Secondary | ICD-10-CM | POA: Insufficient documentation

## 2018-01-11 MED ORDER — IBUPROFEN 800 MG PO TABS: 800.0000 mg | ORAL_TABLET | Freq: Three times a day (TID) | ORAL | 0 refills | Status: DC | PRN

## 2018-01-11 NOTE — ED Provider Notes (Signed)
[facility]   CSN: [id number] Arrival date & time: 01/11/18  0148     History   Chief Complaint Chief Complaint  Patient presents with  . Otalgia    HPI Kelsey Carroll is a 22 y.o. female.  HPI   22 year old female presenting for evaluation of right ear pain.  Patient report pain in the right ear for the past 2 to 3 days.  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  There is no associated fever, chills, ringing sound in the ear, loss of hearing, ear drainage, dental pain, or URI symptoms.  No specific treatment tried.  Denies any injury.  Patient requesting for work note.  Past Medical History:  Diagnosis Date  . Chronic back pain   . Obesity     Patient Active Problem List   Diagnosis Date Noted  . Depression 01/14/2013  . Hydradenitis 02/29/2012  . Impaired hearing 02/29/2012  . Obesity (BMI 35.0-39.9 without comorbidity) 02/29/2012    History reviewed. No pertinent surgical history.   OB History    Gravida  1   Para      Term      Preterm      AB  1   Living        SAB      TAB  1   Ectopic      Multiple      Live Births               Home Medications    Prior to Admission medications   Medication Sig Start Date End Date Taking? Authorizing Provider  cephALEXin (KEFLEX) 500 MG capsule Take 1 capsule (500 mg total) by mouth 3 (three) times daily with meals. 03/21/17   Wallis BambergMani, Mario, PA-C  fluticasone (FLONASE) 50 MCG/ACT nasal spray Place 2 sprays into both nostrils daily. 09/12/17   Fawze, Mina A, PA-C  ibuprofen (ADVIL,MOTRIN) 800 MG tablet Take 1 tablet (800 mg total) by mouth every 8 (eight) hours as needed for moderate pain. 07/11/17   Lowanda FosterBrewer, Mindy, NP    Family History Family History  Problem Relation Age of Onset  . Hypertension Mother 6548  . Anxiety disorder Mother   .  Hyperlipidemia Father 6542  . Stroke Maternal Grandmother   . Anxiety disorder Sister     Social History Social History   Tobacco Use  . Smoking status: Never Smoker  . Smokeless tobacco: Never Used  Substance Use Topics  . Alcohol use: No  . Drug use: Yes    Types: Marijuana     Allergies   Patient has no known allergies.   Review of Systems Review of Systems  Constitutional: Negative for fever.  HENT: Positive for ear pain. Negative for ear discharge and hearing loss.      Physical Exam Updated Vital Signs BP 112/71 (BP Location: Right Arm)   Pulse 68   Temp 97.8 F (36.6 C) (Oral)   Resp 18   Ht 5\' 6"  (1.676 m)   SpO2 99%   BMI 35.35 kg/m   Physical Exam  Constitutional: She appears well-developed and well-nourished. No distress.  HENT:  Head: Atraumatic.  Ears: TMs normal bilaterally, normal ear canals, no tenderness to manipulations of earlobes.  No lymphadenopathy and no evidence of mastoiditis. Nose: Normal nares Mouth: Normal dentition without evidence of dental decay  or dental infection.   Eyes: Conjunctivae are normal.  Neck: Neck supple.  Lymphadenopathy:    She has no cervical adenopathy.  Neurological: She is alert.  Skin: No rash noted.  Psychiatric: She has a normal mood and affect.  Nursing note and vitals reviewed.    ED Treatments / Results  Labs (all labs ordered are listed, but only abnormal results are displayed) Labs Reviewed - No data to display  EKG None  Radiology No results found.  Procedures Procedures (including critical care time)  Medications Ordered in ED Medications - No data to display   Initial Impression / Assessment and Plan / ED Course  I have reviewed the triage vital signs and the nursing notes.  Pertinent labs & imaging results that were available during my care of the patient were reviewed by me and considered in my medical decision making (see chart for details).     BP 112/71 (BP Location:  Right Arm)   Pulse 68   Temp 97.8 F (36.6 C) (Oral)   Resp 18   Ht 5\' 6"  (1.676 m)   SpO2 99%   BMI 35.35 kg/m    Final Clinical Impressions(s) / ED Diagnoses   Final diagnoses:  Otalgia of right ear    ED Discharge Orders        Ordered    ibuprofen (ADVIL,MOTRIN) 800 MG tablet  Every 8 hours PRN     01/11/18 0315     3:14 AM R ear pain x 3 days.  No evidence of infection. No changes in hearing.  Recommend OTC tylenol/ibuprofen as needed.  Work note provided.   Fayrene Helper, PA-C 01/11/18 6962    Shon Baton, MD 01/11/18 [id number]

## 2018-01-11 NOTE — ED Triage Notes (Signed)
Pt reports R ear pain X2-3 days.

## 2018-04-26 ENCOUNTER — Emergency Department (HOSPITAL_COMMUNITY)
Admission: EM | Admit: 2018-04-26 | Discharge: 2018-04-26 | Disposition: A | Discharge status: Home / Self Care | Payer: Medicaid Other | Attending: Emergency Medicine | Admitting: Emergency Medicine

## 2018-04-26 ENCOUNTER — Other Ambulatory Visit: Payer: Self-pay

## 2018-04-26 DIAGNOSIS — J069 Acute upper respiratory infection, unspecified: Secondary | ICD-10-CM | POA: Insufficient documentation

## 2018-04-26 MED ORDER — BENZONATATE 200 MG PO CAPS
200.0000 mg | ORAL_CAPSULE | Freq: Three times a day (TID) | ORAL | 0 refills | Status: AC | PRN
Start: 2018-04-26 — End: 2018-05-03

## 2018-04-26 NOTE — ED Notes (Signed)
Declined W/C at D/C and was escorted to lobby by RN. 

## 2018-04-26 NOTE — ED Triage Notes (Signed)
Pt reports congestion with sinus pain. Started Friday .

## 2018-04-26 NOTE — ED Provider Notes (Signed)
[facility] EMERGENCY DEPARTMENT Provider Note   CSN: [id number] Arrival date & time: 04/26/18  2440     History   Chief Complaint Chief Complaint  Patient presents with  . URI    HPI Kelsey Carroll is a 22 y.o. female.  22 year old female presents with complaint of nonproductive cough, congestion, runny nose, mild body aches.  Patient states that her symptoms started a few days ago, she has been taking TheraFlu and states she feels like she is starting to feel better today.  Patient came to the emergency room today because she called out of work for not feeling well this morning and needs a note for work.  Denies history of asthma, chronic lung disease.  Patient does not vape.  No other complaints or concerns today.     Past Medical History:  Diagnosis Date  . Chronic back pain   . Obesity     Patient Active Problem List   Diagnosis Date Noted  . Depression 01/14/2013  . Hydradenitis 02/29/2012  . Impaired hearing 02/29/2012  . Obesity (BMI 35.0-39.9 without comorbidity) 02/29/2012    No past surgical history on file.   OB History    Gravida  1   Para      Term      Preterm      AB  1   Living        SAB      TAB  1   Ectopic      Multiple      Live Births               Home Medications    Prior to Admission medications   Medication Sig Start Date End Date Taking? Authorizing Provider  benzonatate (TESSALON) 200 MG capsule Take 1 capsule (200 mg total) by mouth 3 (three) times daily as needed for up to 7 days for cough. 04/26/18 05/03/18  Jeannie Fend, PA-C  cephALEXin (KEFLEX) 500 MG capsule Take 1 capsule (500 mg total) by mouth 3 (three) times daily with meals. Patient not taking: Reported on 04/26/2018 03/21/17   Wallis Bamberg, PA-C  fluticasone [facility]) 50 MCG/ACT nasal spray Place 2 sprays into both nostrils daily. Patient not taking: Reported on 04/26/2018 09/12/17   Michela Pitcher A, PA-C  ibuprofen  (ADVIL,MOTRIN) 800 MG tablet Take 1 tablet (800 mg total) by mouth every 8 (eight) hours as needed for moderate pain. Patient not taking: Reported on 04/26/2018 01/11/18   Fayrene Helper, PA-C    Family History Family History  Problem Relation Age of Onset  . Hypertension Mother 25  . Anxiety disorder Mother   . Hyperlipidemia Father 60  . Stroke Maternal Grandmother   . Anxiety disorder Sister     Social History Social History   Tobacco Use  . Smoking status: Never Smoker  . Smokeless tobacco: Never Used  Substance Use Topics  . Alcohol use: No  . Drug use: Yes    Types: Marijuana     Allergies   Patient has no known allergies.   Review of Systems Review of Systems  Constitutional: Negative for chills and fever.  HENT: Positive for congestion, postnasal drip, rhinorrhea, sneezing and sore throat. Negative for ear pain, sinus pressure, sinus pain, trouble swallowing and voice change.   Eyes: Negative for discharge and redness.  Respiratory: Positive for cough. Negative for shortness of breath and wheezing.   Musculoskeletal: Positive for myalgias. Negative for arthralgias.  Skin: Negative for rash  and wound.  Allergic/Immunologic: Negative for immunocompromised state.  Neurological: Negative for weakness and headaches.  Hematological: Negative for adenopathy.  All other systems reviewed and are negative.    Physical Exam Updated Vital Signs BP 111/68 (BP Location: Right Arm)   Pulse 69   Temp (!) 97.4 F (36.3 C) (Oral)   Resp 20   Ht 5\' 6"  (1.676 m)   Wt 94.8 kg   LMP 04/05/2018   SpO2 99%   BMI 33.73 kg/m   Physical Exam  Constitutional: She is oriented to person, place, and time. She appears well-developed and well-nourished. No distress.  HENT:  Head: Normocephalic and atraumatic.  Right Ear: External ear normal.  Left Ear: External ear normal.  Nose: Mucosal edema present. Right sinus exhibits no maxillary sinus tenderness and no frontal sinus  tenderness. Left sinus exhibits no maxillary sinus tenderness and no frontal sinus tenderness.  Mouth/Throat: Uvula is midline and mucous membranes are normal. No trismus in the jaw. No uvula swelling. Posterior oropharyngeal erythema present. No oropharyngeal exudate, posterior oropharyngeal edema or tonsillar abscesses.  Eyes: Conjunctivae are normal.  Neck: Neck supple.  Cardiovascular: Normal rate, regular rhythm, normal heart sounds and intact distal pulses.  No murmur heard. Pulmonary/Chest: Effort normal and breath sounds normal. No respiratory distress. She has no wheezes.  Lymphadenopathy:    She has no cervical adenopathy.  Neurological: She is alert and oriented to person, place, and time.  Skin: Skin is warm and dry. No rash noted. She is not diaphoretic.  Psychiatric: She has a normal mood and affect. Her behavior is normal.  Nursing note and vitals reviewed.    ED Treatments / Results  Labs (all labs ordered are listed, but only abnormal results are displayed) Labs Reviewed - No data to display  EKG None  Radiology No results found.  Procedures Procedures (including critical care time)  Medications Ordered in ED Medications - No data to display   Initial Impression / Assessment and Plan / ED Course  I have reviewed the triage vital signs and the nursing notes.  Pertinent labs & imaging results that were available during my care of the patient were reviewed by me and considered in my medical decision making (see chart for details).  Clinical Course as of Apr 27 1031  Wed Apr 26, 2018  570 22 year old female with URI symptoms, no complications.  Given Tessalon for cough as needed, recommend supportive therapy, given work note as requested today.   [LM]    Clinical Course User Index [LM] Jeannie Fend, PA-C   Final Clinical Impressions(s) / ED Diagnoses   Final diagnoses:  URI (upper respiratory infection)    ED Discharge Orders         Ordered     benzonatate (TESSALON) 200 MG capsule  3 times daily PRN     04/26/18 1029           Jeannie Fend, PA-C 04/26/18 1032    Terrilee Files, MD 04/26/18 1902

## 2018-04-26 NOTE — Discharge Instructions (Signed)
Home to rest, push clear fluids. Continue with TheraFlu as needed as directed. Saline sinus rinse twice daily, use Flonase as directed. Take Sudafed as needed as prescribed for congestion. Tessalon as needed as prescribed for cough. Recheck with your primary care provider, referral given if needed.

## 2018-05-01 ENCOUNTER — Emergency Department (HOSPITAL_COMMUNITY): Payer: Self-pay

## 2018-05-01 ENCOUNTER — Other Ambulatory Visit: Payer: Self-pay

## 2018-05-01 ENCOUNTER — Emergency Department (HOSPITAL_COMMUNITY)
Admission: EM | Admit: 2018-05-01 | Discharge: 2018-05-01 | Disposition: A | Discharge status: Home / Self Care | Payer: Self-pay | Attending: Emergency Medicine | Admitting: Emergency Medicine

## 2018-05-01 ENCOUNTER — Encounter (HOSPITAL_COMMUNITY): Payer: Self-pay

## 2018-05-01 DIAGNOSIS — Y9289 Other specified places as the place of occurrence of the external cause: Secondary | ICD-10-CM | POA: Insufficient documentation

## 2018-05-01 DIAGNOSIS — M545 Low back pain: Secondary | ICD-10-CM | POA: Insufficient documentation

## 2018-05-01 DIAGNOSIS — Y998 Other external cause status: Secondary | ICD-10-CM | POA: Insufficient documentation

## 2018-05-01 DIAGNOSIS — T148XXA Other injury of unspecified body region, initial encounter: Secondary | ICD-10-CM

## 2018-05-01 DIAGNOSIS — S20412A Abrasion of left back wall of thorax, initial encounter: Secondary | ICD-10-CM | POA: Insufficient documentation

## 2018-05-01 DIAGNOSIS — Z23 Encounter for immunization: Secondary | ICD-10-CM | POA: Insufficient documentation

## 2018-05-01 DIAGNOSIS — W540XXA Bitten by dog, initial encounter: Secondary | ICD-10-CM

## 2018-05-01 DIAGNOSIS — S51851A Open bite of right forearm, initial encounter: Secondary | ICD-10-CM | POA: Insufficient documentation

## 2018-05-01 DIAGNOSIS — Y9389 Activity, other specified: Secondary | ICD-10-CM | POA: Insufficient documentation

## 2018-05-01 LAB — POC URINE PREG, ED: Preg Test, Ur: NEGATIVE

## 2018-05-01 MED ORDER — TETANUS-DIPHTH-ACELL PERTUSSIS 5-2.5-18.5 LF-MCG/0.5 IM SUSP
0.5000 mL | Freq: Once | INTRAMUSCULAR | Status: AC
  Administered 2018-05-01: 0.5 mL via INTRAMUSCULAR
  Filled 2018-05-01: qty 0.5

## 2018-05-01 MED ORDER — NAPROXEN 500 MG PO TABS
500.0000 mg | ORAL_TABLET | Freq: Once | ORAL | Status: AC
  Administered 2018-05-01: 500 mg via ORAL
  Filled 2018-05-01: qty 1

## 2018-05-01 MED ORDER — BACITRACIN ZINC 500 UNIT/GM EX OINT
TOPICAL_OINTMENT | Freq: Once | CUTANEOUS | Status: AC
  Administered 2018-05-01: 1 via TOPICAL
  Filled 2018-05-01: qty 0.9

## 2018-05-01 MED ORDER — AMOXICILLIN-POT CLAVULANATE 875-125 MG PO TABS
1.0000 | ORAL_TABLET | Freq: Once | ORAL | Status: AC
  Administered 2018-05-01: 1 via ORAL
  Filled 2018-05-01: qty 1

## 2018-05-01 MED ORDER — AMOXICILLIN-POT CLAVULANATE 875-125 MG PO TABS: 1.0000 | ORAL_TABLET | Freq: Two times a day (BID) | ORAL | 0 refills | Status: DC

## 2018-05-01 NOTE — ED Triage Notes (Signed)
Pt reports that she was bit by a dog last trying to break up a dog fight pt sustained  Multiple dog bites scratches  on bilateral  Forearms and on her upper middle  back , and rt knee. Pt denies pain at this time but does have puncture wound and swelling noted to rt forearm. Pt unsure of last periods and request pregnancy test.

## 2018-05-01 NOTE — Discharge Instructions (Addendum)
Take antibiotics as directed, keep wounds clean and dressed and monitor closely for signs of worsening infection such as redness, swelling, warmth, drainage, fevers or chills. If any of these signs occur please return to the emergency department for reevaluation. ° °Your tetanus vaccine was updated today. °

## 2018-05-01 NOTE — ED Notes (Signed)
Wound care done

## 2018-05-01 NOTE — ED Provider Notes (Signed)
[facility]-EMERGENCY DEPT Provider Note   CSN: [id number] Arrival date & time: 05/01/18  1234     History   Chief Complaint Chief Complaint  Patient presents with  . Animal Bite    HPI DECEMBER Kelsey Carroll is a 22 y.o. female.  Kelsey Carroll is a 22 y.o. Female with history of obesity and chronic back pain, who presents to the emergency department for evaluation after dog bite.  She reports last night she was trying to break up a fight between her dog and another dog that she is watching.  She reports she sustained multiple scratches to her forearms and a bite to the proximal right forearm with puncture wound but no laceration.  No active bleeding.  She reports her dog and the other dog involved are both up-to-date on rabies vaccination.  She reports tenderness and some mild swelling surrounding the bite but no redness or warmth and no fevers.  Pt also reports that when she got on the ground to scratch them she sustained some scratches to her left upper back but no bites, no bites to the lower extremities.  Unsure when her last tetanus vaccination was.      Past Medical History:  Diagnosis Date  . Chronic back pain   . Obesity     Patient Active Problem List   Diagnosis Date Noted  . Depression 01/14/2013  . Hydradenitis 02/29/2012  . Impaired hearing 02/29/2012  . Obesity (BMI 35.0-39.9 without comorbidity) 02/29/2012    History reviewed. No pertinent surgical history.   OB History    Gravida  1   Para      Term      Preterm      AB  1   Living        SAB      TAB  1   Ectopic      Multiple      Live Births               Home Medications    Prior to Admission medications   Medication Sig Start Date End Date Taking? Authorizing Provider  amoxicillin-clavulanate (AUGMENTIN) 875-125 MG tablet Take 1 tablet by mouth 2 (two) times daily. One po bid x 7 days 05/01/18   Dartha Lodge, PA-C  benzonatate (TESSALON) 200 MG  capsule Take 1 capsule (200 mg total) by mouth 3 (three) times daily as needed for up to 7 days for cough. 04/26/18 05/03/18  Jeannie Fend, PA-C  cephALEXin (KEFLEX) 500 MG capsule Take 1 capsule (500 mg total) by mouth 3 (three) times daily with meals. Patient not taking: Reported on 04/26/2018 03/21/17   Wallis Bamberg, PA-C  fluticasone [facility]) 50 MCG/ACT nasal spray Place 2 sprays into both nostrils daily. Patient not taking: Reported on 04/26/2018 09/12/17   Michela Pitcher A, PA-C  ibuprofen (ADVIL,MOTRIN) 800 MG tablet Take 1 tablet (800 mg total) by mouth every 8 (eight) hours as needed for moderate pain. Patient not taking: Reported on 04/26/2018 01/11/18   Fayrene Helper, PA-C    Family History Family History  Problem Relation Age of Onset  . Hypertension Mother 7  . Anxiety disorder Mother   . Hyperlipidemia Father 61  . Stroke Maternal Grandmother   . Anxiety disorder Sister     Social History Social History   Tobacco Use  . Smoking status: Never Smoker  . Smokeless tobacco: Never Used  Substance Use Topics  . Alcohol use: No  .  Drug use: Yes    Types: Marijuana     Allergies   Patient has no known allergies.   Review of Systems Review of Systems  Constitutional: Negative for chills and fever.  Gastrointestinal: Negative for nausea and vomiting.  Musculoskeletal: Positive for arthralgias and myalgias.  Skin: Positive for wound. Negative for color change.  Neurological: Negative for weakness and numbness.     Physical Exam Updated Vital Signs BP 117/74 (BP Location: Right Arm)   Pulse 88   Temp 99.3 F (37.4 C) (Oral)   Resp 18   LMP 04/05/2018 (LMP Unknown)   SpO2 99%   Physical Exam  Constitutional: She appears well-developed and well-nourished. No distress.  HENT:  Head: Normocephalic and atraumatic.  Eyes: Right eye exhibits no discharge. Left eye exhibits no discharge.  Pulmonary/Chest: Effort normal. No respiratory distress.  Musculoskeletal:    Multiple superficial scratches to bilateral forearms, there are 2 puncture wounds noted over the proximal aspect of the right forearm which are scabbed over, there is some surrounding swelling but no erythema or warmth, no purulent drainage.  There are also superficial scratches noted to the left upper back with no puncture bite wounds.  No lacerations or larger wounds requiring suture repair. No scratches or bites over the lower extremities.  Neurological: She is alert. Coordination normal.  Skin: She is not diaphoretic.  Psychiatric: She has a normal mood and affect. Her behavior is normal.  Nursing note and vitals reviewed.    ED Treatments / Results  Labs (all labs ordered are listed, but only abnormal results are displayed) Labs Reviewed  POC URINE PREG, ED    EKG None  Radiology Dg Forearm Right  Result Date: 05/01/2018 CLINICAL DATA:  Dog bite. EXAM: RIGHT FOREARM - 2 VIEW COMPARISON:  None. FINDINGS: There is no evidence of fracture or other focal bone lesions. Mild soft tissue swelling. No radiopaque foreign body. IMPRESSION: Mild soft tissue swelling.  No fracture. Electronically Signed   By: Elsie Stain M.D.   On: 05/01/2018 14:20    Procedures Procedures (including critical care time)  Medications Ordered in ED Medications  amoxicillin-clavulanate (AUGMENTIN) 875-125 MG per tablet 1 tablet (1 tablet Oral Given 05/01/18 1404)  bacitracin ointment (1 application Topical Given 05/01/18 1406)  Tdap (BOOSTRIX) injection 0.5 mL (0.5 mLs Intramuscular Given 05/01/18 1405)  naproxen (NAPROSYN) tablet 500 mg (500 mg Oral Given 05/01/18 1405)     Initial Impression / Assessment and Plan / ED Course  I have reviewed the triage vital signs and the nursing notes.  Pertinent labs & imaging results that were available during my care of the patient were reviewed by me and considered in my medical decision making (see chart for details).  Patient presents with laceration  from a dog bite.  Pt wounds irrigated well with 18ga angiocath with sterile saline.  Wounds examined with visualization of the base and no foreign bodies seen.  Pt Alert and oriented, NAD, nontoxic, nonseptic appearing.  Capillary refill intact and pt without neurologic deficit.  X-rays with no signs of fracture or foreign body is small amount of soft tissue swelling but no soft tissue gas. Patient tetanus updated.  Patient rabies vaccine and immunoglobulin risk and benefit discussed, luckily both dogs up to date on rabies vaccination. Pain treated in the emergency department. Wounds not closed secondary to concern for infection, they are very small and already healing. Will discharge home with NSAIDs, Augmentin and requests for close follow-up with PCP or back  in the ER.  Appropriate return precautions discussed and patient expresses understanding and is in agreement with plan.  Stable for discharge home at this time.  Final Clinical Impressions(s) / ED Diagnoses   Final diagnoses:  Dog bite, initial encounter  Superficial abrasion    ED Discharge Orders         Ordered    amoxicillin-clavulanate (AUGMENTIN) 875-125 MG tablet  2 times daily     05/01/18 1434           Dartha Lodge, [place] 05/01/18 1435    Raeford Razor, MD 05/01/18 1546

## 2018-05-03 ENCOUNTER — Encounter (HOSPITAL_COMMUNITY): Payer: Self-pay | Admitting: *Deleted

## 2018-05-03 ENCOUNTER — Emergency Department (HOSPITAL_COMMUNITY)
Admission: EM | Admit: 2018-05-03 | Discharge: 2018-05-03 | Disposition: A | Discharge status: Home / Self Care | Payer: Medicaid Other | Attending: Emergency Medicine | Admitting: Emergency Medicine

## 2018-05-03 ENCOUNTER — Other Ambulatory Visit: Payer: Self-pay

## 2018-05-03 DIAGNOSIS — T148XXD Other injury of unspecified body region, subsequent encounter: Secondary | ICD-10-CM | POA: Insufficient documentation

## 2018-05-03 DIAGNOSIS — W540XXD Bitten by dog, subsequent encounter: Secondary | ICD-10-CM | POA: Insufficient documentation

## 2018-05-03 DIAGNOSIS — Z79899 Other long term (current) drug therapy: Secondary | ICD-10-CM | POA: Insufficient documentation

## 2018-05-03 DIAGNOSIS — T148XXA Other injury of unspecified body region, initial encounter: Secondary | ICD-10-CM

## 2018-05-03 NOTE — ED Provider Notes (Signed)
[facility] EMERGENCY DEPARTMENT Provider Note   CSN: [id number] Arrival date & time: 05/03/18  4098     History   Chief Complaint Chief Complaint  Patient presents with  . Arm Injury    HPI Kelsey Carroll is a 22 y.o. female with a past medical history of chronic back pain presents to ED for evaluation after dog bite.  States that on 10/21, she was trying to break up a fight between her dog and another dog.  She had multiple scratches on her forearms and left upper back.  At the time she was evaluated, she was given Augmentin and naproxen.  However, she states that she is "pretty sore especially this morning."  She has been taking her antibiotics and NSAIDs as prescribed.  Dogs were both up-to-date on rabies vaccination.  She was updated on tetanus during her prior visit.  Denies any signs of infection, fever, injuries or falls.  HPI  Past Medical History:  Diagnosis Date  . Chronic back pain   . Obesity     Patient Active Problem List   Diagnosis Date Noted  . Depression 01/14/2013  . Hydradenitis 02/29/2012  . Impaired hearing 02/29/2012  . Obesity (BMI 35.0-39.9 without comorbidity) 02/29/2012    History reviewed. No pertinent surgical history.   OB History    Gravida  1   Para      Term      Preterm      AB  1   Living        SAB      TAB  1   Ectopic      Multiple      Live Births               Home Medications    Prior to Admission medications   Medication Sig Start Date End Date Taking? Authorizing Provider  amoxicillin-clavulanate (AUGMENTIN) 875-125 MG tablet Take 1 tablet by mouth 2 (two) times daily. One po bid x 7 days 05/01/18   Dartha Lodge, PA-C  benzonatate (TESSALON) 200 MG capsule Take 1 capsule (200 mg total) by mouth 3 (three) times daily as needed for up to 7 days for cough. 04/26/18 05/03/18  Jeannie Fend, PA-C  cephALEXin (KEFLEX) 500 MG capsule Take 1 capsule (500 mg total) by mouth 3 (three)  times daily with meals. Patient not taking: Reported on 04/26/2018 03/21/17   Wallis Bamberg, PA-C  fluticasone [facility]) 50 MCG/ACT nasal spray Place 2 sprays into both nostrils daily. Patient not taking: Reported on 04/26/2018 09/12/17   Michela Pitcher A, PA-C  ibuprofen (ADVIL,MOTRIN) 800 MG tablet Take 1 tablet (800 mg total) by mouth every 8 (eight) hours as needed for moderate pain. Patient not taking: Reported on 04/26/2018 01/11/18   Fayrene Helper, PA-C    Family History Family History  Problem Relation Age of Onset  . Hypertension Mother 58  . Anxiety disorder Mother   . Hyperlipidemia Father 56  . Stroke Maternal Grandmother   . Anxiety disorder Sister     Social History Social History   Tobacco Use  . Smoking status: Never Smoker  . Smokeless tobacco: Never Used  Substance Use Topics  . Alcohol use: No  . Drug use: Yes    Types: Marijuana     Allergies   Patient has no known allergies.   Review of Systems Review of Systems  Constitutional: Negative for chills and fever.  Musculoskeletal: Positive for myalgias.  Skin: Positive  for wound.     Physical Exam Updated Vital Signs BP 111/71   Pulse 78   Temp 98 F (36.7 C) (Oral)   Resp 20   LMP 04/05/2018 (LMP Unknown)   SpO2 100%   Physical Exam  Constitutional: She appears well-developed and well-nourished. No distress.  HENT:  Head: Normocephalic and atraumatic.  Eyes: Conjunctivae and EOM are normal. No scleral icterus.  Neck: Normal range of motion.  Pulmonary/Chest: Effort normal. No respiratory distress.  Neurological: She is alert.  Skin: No rash noted. She is not diaphoretic.  Multiple, well-healing superficial scratches to bilateral forearms, there are 2 puncture wounds noted over the proximal aspect of the right forearm which are scabbed over and appears to be well-healing as well.  No purulent drainage or erythema noted.  There are also superficial scratches noted to the left upper back with no  puncture bite wounds.   Psychiatric: She has a normal mood and affect.  Nursing note and vitals reviewed.    ED Treatments / Results  Labs (all labs ordered are listed, but only abnormal results are displayed) Labs Reviewed - No data to display  EKG None  Radiology Dg Forearm Right  Result Date: 05/01/2018 CLINICAL DATA:  Dog bite. EXAM: RIGHT FOREARM - 2 VIEW COMPARISON:  None. FINDINGS: There is no evidence of fracture or other focal bone lesions. Mild soft tissue swelling. No radiopaque foreign body. IMPRESSION: Mild soft tissue swelling.  No fracture. Electronically Signed   By: Elsie Stain M.D.   On: 05/01/2018 14:20    Procedures Procedures (including critical care time)  Medications Ordered in ED Medications - No data to display   Initial Impression / Assessment and Plan / ED Course  I have reviewed the triage vital signs and the nursing notes.  Pertinent labs & imaging results that were available during my care of the patient were reviewed by me and considered in my medical decision making (see chart for details).     22 year old female presents to ED for pain after she was scratched by a dog on 10/21.  She was seen and evaluated in the ED and was given naproxen and Augmentin.  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  On examination of her wounds, they appear to be well-healing with no signs of infection.  She is afebrile.  She was requesting hydrocodone for pain.  I do not feel that this is reasonable for these skin abrasions which did not even require suturing.  I explained to patient that it is best for her to continue her NSAIDs and antibiotics.  Advised to return to ED for any severe worsening symptoms.  Portions of this note were generated with Scientist, clinical (histocompatibility and immunogenetics). Dictation errors may occur despite best attempts at proofreading.   Final Clinical  Impressions(s) / ED Diagnoses   Final diagnoses:  Abrasion of skin    ED Discharge Orders    None       Dietrich Pates, PA-C 05/03/18 0950    Mancel Bale, MD 05/03/18 [phone]

## 2018-05-03 NOTE — ED Notes (Signed)
Signature pad not working verbalized understanding of discharge instructions.,

## 2018-05-03 NOTE — ED Triage Notes (Signed)
Pt in c/o worsened arm pain after multiple dog bites yesterday, seen at [facility] yesterday and prescribed naproxen but it is not helping pain

## 2018-05-03 NOTE — Discharge Instructions (Signed)
Return to ED for worsening symptoms, signs of infection including redness around the area, pus draining from the area, fevers.

## 2018-05-05 ENCOUNTER — Encounter (HOSPITAL_COMMUNITY): Payer: Self-pay | Admitting: *Deleted

## 2018-05-05 ENCOUNTER — Emergency Department (HOSPITAL_COMMUNITY)
Admission: EM | Admit: 2018-05-05 | Discharge: 2018-05-05 | Disposition: A | Discharge status: Home / Self Care | Payer: Medicaid Other | Attending: Emergency Medicine | Admitting: Emergency Medicine

## 2018-05-05 DIAGNOSIS — Z79899 Other long term (current) drug therapy: Secondary | ICD-10-CM | POA: Insufficient documentation

## 2018-05-05 DIAGNOSIS — R112 Nausea with vomiting, unspecified: Secondary | ICD-10-CM | POA: Insufficient documentation

## 2018-05-05 DIAGNOSIS — R197 Diarrhea, unspecified: Secondary | ICD-10-CM | POA: Insufficient documentation

## 2018-05-05 LAB — COMPREHENSIVE METABOLIC PANEL
ALBUMIN: 4 g/dL (ref 3.5–5.0)
ALT: 17 U/L (ref 0–44)
ANION GAP: 7 (ref 5–15)
AST: 23 U/L (ref 15–41)
Alkaline Phosphatase: 38 U/L (ref 38–126)
BILIRUBIN TOTAL: 0.9 mg/dL (ref 0.3–1.2)
BUN: 7 mg/dL (ref 6–20)
CO2: 23 mmol/L (ref 22–32)
Calcium: 9.4 mg/dL (ref 8.9–10.3)
Chloride: 109 mmol/L (ref 98–111)
Creatinine, Ser: 1.04 mg/dL — ABNORMAL HIGH (ref 0.44–1.00)
GFR calc Af Amer: >60 mL/min (ref >60)
GFR calc non Af Amer: >60 mL/min (ref >60)
GLUCOSE: 95 mg/dL (ref 70–99)
POTASSIUM: 4.1 mmol/L (ref 3.5–5.1)
Sodium: 139 mmol/L (ref 135–145)
TOTAL PROTEIN: 7.2 g/dL (ref 6.5–8.1)

## 2018-05-05 LAB — CBC
HCT: 39.4 % (ref 36.0–46.0)
HEMOGLOBIN: 12.8 g/dL (ref 12.0–15.0)
MCH: 28.1 pg (ref 26.0–34.0)
MCHC: 32.5 g/dL (ref 30.0–36.0)
MCV: 86.4 fL (ref 80.0–100.0)
NRBC: 0 % (ref 0.0–0.2)
Platelets: 337 10*3/uL (ref 150–400)
RBC: 4.56 MIL/uL (ref 3.87–5.11)
RDW: 13.9 % (ref 11.5–15.5)
WBC: 4.6 10*3/uL (ref 4.0–10.5)

## 2018-05-05 LAB — I-STAT BETA HCG BLOOD, ED (MC, WL, AP ONLY)

## 2018-05-05 LAB — LIPASE, BLOOD: Lipase: 26 U/L (ref 11–51)

## 2018-05-05 MED ORDER — ONDANSETRON HCL 4 MG PO TABS: 4.0000 mg | ORAL_TABLET | Freq: Three times a day (TID) | ORAL | 0 refills | Status: DC | PRN

## 2018-05-05 MED ORDER — ONDANSETRON 4 MG PO TBDP
4.0000 mg | ORAL_TABLET | Freq: Once | ORAL | Status: AC | PRN
  Administered 2018-05-05: 4 mg via ORAL
  Filled 2018-05-05: qty 1

## 2018-05-05 MED ORDER — SODIUM CHLORIDE 0.9 % IV BOLUS
1000.0000 mL | Freq: Once | INTRAVENOUS | Status: AC
  Administered 2018-05-05: 1000 mL via INTRAVENOUS

## 2018-05-05 NOTE — ED Triage Notes (Signed)
Pt in c/o n/v and menstrual cramps that started last night, pt states she cannot keep anything down, reports this happens sometimes when she is on her cycle, tearful in triage

## 2018-05-05 NOTE — ED Provider Notes (Signed)
[facility] EMERGENCY DEPARTMENT Provider Note   CSN: [id number] Arrival date & time: 05/05/18  1191     History   Chief Complaint Chief Complaint  Patient presents with  . Abdominal Pain  . Emesis    HPI Kelsey Carroll is a 22 y.o. female is no significant PMH who presents with abdominal pain and emesis with diarrhea since 10 PM yesterday, October 24.  She describes her pain as cramping in quality, and it is located in her pelvic area.  She has had many bouts of emesis since her pain has occurred.  She says she normally gets these type of cramps during her period, but the nausea and diarrhea are different from normal.  She says that she ate some seafood a couple days ago and some chicken and colored yesterday, and does not know if any of these could have caused her symptoms.  She denies fevers.  She denies alcohol use, but does endorse smoking marijuana last night to help with her cramping.  She says she has been unable to keep anything down, and is currently being treated with Flagyl for bacterial vaginosis.  She says that she was diagnosed with bacterial vaginosis and a yeast infection at the [facility] about 1 week ago.  She says that she was tested for STIs at that time and was negative for these.  She is not concerned that she could have had any other STI exposure since last week.  She denies any changes in her vaginal discharge in the past week.     Past Medical History:  Diagnosis Date  . Chronic back pain   . Obesity     Patient Active Problem List   Diagnosis Date Noted  . Depression 01/14/2013  . Hydradenitis 02/29/2012  . Impaired hearing 02/29/2012  . Obesity (BMI 35.0-39.9 without comorbidity) 02/29/2012    History reviewed. No pertinent surgical history.   OB History    Gravida  1   Para      Term      Preterm      AB  1   Living        SAB      TAB  1   Ectopic      Multiple      Live Births                Home Medications    Prior to Admission medications   Medication Sig Start Date End Date Taking? Authorizing Provider  fluconazole (DIFLUCAN) 150 MG tablet Take 150 mg by mouth See admin instructions. Take 150 mg on day 1 day 4 and day 7   Yes [provider]  metroNIDAZOLE (FLAGYL) 500 MG tablet Take 500 mg by mouth 2 (two) times daily.   Yes [provider]  amoxicillin-clavulanate (AUGMENTIN) 875-125 MG tablet Take 1 tablet by mouth 2 (two) times daily. One po bid x 7 days Patient not taking: Reported on 05/05/2018 05/01/18   Dartha Lodge, PA-C  cephALEXin (KEFLEX) 500 MG capsule Take 1 capsule (500 mg total) by mouth 3 (three) times daily with meals. Patient not taking: Reported on 04/26/2018 03/21/17   Wallis Bamberg, PA-C  fluticasone [facility]) 50 MCG/ACT nasal spray Place 2 sprays into both nostrils daily. Patient not taking: Reported on 04/26/2018 09/12/17   Michela Pitcher A, PA-C  ibuprofen (ADVIL,MOTRIN) 800 MG tablet Take 1 tablet (800 mg total) by mouth every 8 (eight) hours as needed for moderate pain. Patient  not taking: Reported on 04/26/2018 01/11/18   Fayrene Helper, PA-C  ondansetron (ZOFRAN) 4 MG tablet Take 1 tablet (4 mg total) by mouth every 8 (eight) hours as needed for nausea or vomiting. 05/05/18   Lennox Solders, MD    Family History Family History  Problem Relation Age of Onset  . Hypertension Mother 47  . Anxiety disorder Mother   . Hyperlipidemia Father 7  . Stroke Maternal Grandmother   . Anxiety disorder Sister     Social History Social History   Tobacco Use  . Smoking status: Never Smoker  . Smokeless tobacco: Never Used  Substance Use Topics  . Alcohol use: No  . Drug use: Yes    Types: Marijuana     Allergies   Patient has no known allergies.   Review of Systems Review of Systems  Constitutional: Negative for activity change, chills, fatigue and fever.  Respiratory: Negative for cough and shortness of breath.    Cardiovascular: Negative for chest pain and leg swelling.  Gastrointestinal: Positive for abdominal pain, diarrhea, nausea and vomiting. Negative for abdominal distention, blood in stool and constipation.  Genitourinary: Positive for pelvic pain. Negative for dysuria, flank pain, frequency, menstrual problem and vaginal discharge.  Musculoskeletal: Negative for gait problem.  Skin: Negative for rash.  Neurological: Negative for light-headedness.     Physical Exam Updated Vital Signs BP 120/69 (BP Location: Right Arm)   Pulse (!) 101   Temp 98.4 F (36.9 C)   Resp 18   Ht 5\' 6"  (1.676 m)   Wt 94.8 kg   LMP 05/05/2018   SpO2 100%   BMI 33.73 kg/m   Physical Exam  Constitutional: She appears well-developed and well-nourished.  Non-toxic appearance. No distress.  HENT:  Head: Normocephalic and atraumatic.  Mucous membranes slightly dry  Eyes: EOM are normal. No scleral icterus.  Cardiovascular: Normal rate, regular rhythm and normal heart sounds.  Pulmonary/Chest: Effort normal and breath sounds normal.  Abdominal: Soft. Normal appearance and bowel sounds are normal. She exhibits no distension. There is no hepatosplenomegaly. There is generalized tenderness and tenderness in the suprapubic area. There is no rebound, no CVA tenderness, no tenderness at McBurney's point and negative Murphy's sign. No hernia.  Mild tenderness throughout her abdomen, worse in the pelvic area.  No peritoneal signs.  Neurological: She is alert.  Skin: Skin is warm and dry.  Psychiatric: She has a normal mood and affect.     ED Treatments / Results  Labs (all labs ordered are listed, but only abnormal results are displayed) Labs Reviewed  COMPREHENSIVE METABOLIC PANEL - Abnormal; Notable for the following components:      Result Value   Creatinine, Ser 1.04 (*)    All other components within normal limits  LIPASE, BLOOD  CBC  I-STAT BETA HCG BLOOD, ED ([facility], [facility], [facility] ONLY)     EKG None  Radiology No results found.  Procedures Procedures (including critical care time)  Medications Ordered in ED Medications  ondansetron (ZOFRAN-ODT) disintegrating tablet 4 mg (4 mg Oral Given 05/05/18 0819)  sodium chloride 0.9 % bolus 1,000 mL (0 mLs Intravenous Stopped 05/05/18 1000)     Initial Impression / Assessment and Plan / ED Course  I have reviewed the triage vital signs and the nursing notes.  Pertinent labs & imaging results that were available during my care of the patient were reviewed by me and considered in my medical decision making (see chart for details).  Patient's labs appear reassuring.  Will give patient fluid bolus and Zofran for rehydration and symptom control.  We will also do a p.o. challenge prior to discharge.  Patient tolerated p.o. challenge well and says that her nausea is well controlled, she has had no emesis or diarrhea since being in the ED.  We will give her a short course of Zofran and discharge her at this time.  Final Clinical Impressions(s) / ED Diagnoses   Final diagnoses:  Nausea vomiting and diarrhea    ED Discharge Orders         Ordered    ondansetron (ZOFRAN) 4 MG tablet  Every 8 hours PRN     05/05/18 1037           Lennox Solders, MD 05/05/18 1452    Blane Ohara, MD 05/05/18 1722

## 2018-05-05 NOTE — Discharge Instructions (Signed)
Please use Zofran for your nausea as needed, up to three times per day.  Please call the attached doctor's offices to set up with a PCP so that you can get more help for your period cramps.  Use tylenol and a hot pad for your pelvic pain and return to the Emergency Department if your pain worsens significantly or you have not urinated for more than 12 hours.

## 2018-08-17 ENCOUNTER — Encounter (HOSPITAL_COMMUNITY): Payer: Self-pay | Admitting: Emergency Medicine

## 2018-08-17 ENCOUNTER — Ambulatory Visit (HOSPITAL_COMMUNITY)
Admission: EM | Admit: 2018-08-17 | Discharge: 2018-08-17 | Disposition: A | Discharge status: Home / Self Care | Payer: BLUE CROSS/BLUE SHIELD | Attending: Internal Medicine | Admitting: Internal Medicine

## 2018-08-17 ENCOUNTER — Other Ambulatory Visit: Payer: Self-pay

## 2018-08-17 DIAGNOSIS — E860 Dehydration: Secondary | ICD-10-CM | POA: Diagnosis not present

## 2018-08-17 DIAGNOSIS — J020 Streptococcal pharyngitis: Secondary | ICD-10-CM

## 2018-08-17 DIAGNOSIS — R55 Syncope and collapse: Secondary | ICD-10-CM | POA: Diagnosis not present

## 2018-08-17 DIAGNOSIS — I951 Orthostatic hypotension: Secondary | ICD-10-CM

## 2018-08-17 LAB — POCT RAPID STREP A: Streptococcus, Group A Screen (Direct): POSITIVE — AB

## 2018-08-17 MED ORDER — PENICILLIN V POTASSIUM 500 MG PO TABS: 500.0000 mg | ORAL_TABLET | Freq: Three times a day (TID) | ORAL | 0 refills | Status: AC

## 2018-08-17 MED ORDER — SODIUM CHLORIDE 0.9 % IV BOLUS
1000.0000 mL | Freq: Once | INTRAVENOUS | Status: AC
  Administered 2018-08-17: 1000 mL via INTRAVENOUS

## 2018-08-17 MED ORDER — IBUPROFEN 400 MG PO TABS: 400.0000 mg | ORAL_TABLET | Freq: Four times a day (QID) | ORAL | 0 refills | Status: DC | PRN

## 2018-08-17 MED ORDER — ONDANSETRON 4 MG PO TBDP: 4.0000 mg | ORAL_TABLET | Freq: Three times a day (TID) | ORAL | 0 refills | Status: DC | PRN

## 2018-08-17 NOTE — ED Notes (Signed)
Notified traci, np of both blood pressure readings at triage

## 2018-08-17 NOTE — ED Triage Notes (Signed)
Onset of symptoms 2 days ago.  Complains of feeling bad, drained, heavy.  Patient has had a sore throat, patient has runny nose, stuffy nose, sneezing, and dry cough

## 2018-08-17 NOTE — ED Provider Notes (Signed)
[facility]    CSN: [id number] Arrival date & time: 08/17/18  0935     History   Chief Complaint Chief Complaint  Patient presents with  . URI    HPI Delcie RochBrianna L Grunewald is a 23 y.o. female with no difficult past medical history comes to the urgent care department on account of runny nose, cough,, sore throat, fever and chills of 2 days duration.  Patient said his symptoms started with a sore throat and subsequently developed runny nose and a cough.  She has some pain with swallowing.  No relieving factors.  She has not tried any over-the-counter medications.  She has associated fevers and chills.  She had an episode of fainting yesterday at home.  She did not hit her head.  HPI  Past Medical History:  Diagnosis Date  . Chronic back pain   . Obesity     Patient Active Problem List   Diagnosis Date Noted  . Depression 01/14/2013  . Hydradenitis 02/29/2012  . Impaired hearing 02/29/2012  . Obesity (BMI 35.0-39.9 without comorbidity) 02/29/2012    History reviewed. No pertinent surgical history.  OB History    Gravida  1   Para      Term      Preterm      AB  1   Living        SAB      TAB  1   Ectopic      Multiple      Live Births               Home Medications    Prior to Admission medications   Medication Sig Start Date End Date Taking? Authorizing Provider  amoxicillin-clavulanate (AUGMENTIN) 875-125 MG tablet Take 1 tablet by mouth 2 (two) times daily. One po bid x 7 days Patient not taking: Reported on 05/05/2018 05/01/18   Dartha LodgeFord, Kelsey N, PA-C  cephALEXin (KEFLEX) 500 MG capsule Take 1 capsule (500 mg total) by mouth 3 (three) times daily with meals. Patient not taking: Reported on 04/26/2018 03/21/17   Wallis BambergMani, Mario, PA-C  fluconazole (DIFLUCAN) 150 MG tablet Take 150 mg by mouth See admin instructions. Take 150 mg on day 1 day 4 and day 7    [provider]  fluticasone (FLONASE) 50 MCG/ACT nasal spray Place 2 sprays into  both nostrils daily. Patient not taking: Reported on 04/26/2018 09/12/17   Michela PitcherFawze, Mina A, PA-C  ibuprofen (ADVIL,MOTRIN) 800 MG tablet Take 1 tablet (800 mg total) by mouth every 8 (eight) hours as needed for moderate pain. Patient not taking: Reported on 04/26/2018 01/11/18   Fayrene Helperran, Bowie, PA-C  metroNIDAZOLE (FLAGYL) 500 MG tablet Take 500 mg by mouth 2 (two) times daily.    [provider]  ondansetron (ZOFRAN) 4 MG tablet Take 1 tablet (4 mg total) by mouth every 8 (eight) hours as needed for nausea or vomiting. 05/05/18   Lennox SoldersWinfrey, Amanda C, MD    Family History Family History  Problem Relation Age of Onset  . Hypertension Mother 3848  . Anxiety disorder Mother   . Hyperlipidemia Father [id number]  . Stroke Maternal Grandmother   . Anxiety disorder Sister     Social History Social History   Tobacco Use  . Smoking status: Never Smoker  . Smokeless tobacco: Never Used  Substance Use Topics  . Alcohol use: No  . Drug use: Yes    Types: Marijuana     Allergies   Patient has no  known allergies.   Review of Systems Review of Systems  Constitutional: Positive for activity change, appetite change, chills, fatigue and fever.  HENT: Positive for congestion, rhinorrhea, sore throat and tinnitus. Negative for ear discharge, ear pain, facial swelling, postnasal drip, sinus pressure, sinus pain and voice change.   Eyes: Negative for pain, discharge, redness and itching.  Respiratory: Negative for cough, chest tightness, shortness of breath and wheezing.   Cardiovascular: Negative for chest pain and palpitations.  Gastrointestinal: Positive for nausea. Negative for abdominal pain, diarrhea and vomiting.  Musculoskeletal: Positive for arthralgias, back pain and myalgias. Negative for neck pain and neck stiffness.  Allergic/Immunologic: Negative for environmental allergies and food allergies.  Neurological: Positive for dizziness, syncope, light-headedness and headaches.     Physical  Exam Triage Vital Signs ED Triage Vitals  Enc Vitals Group     BP 08/17/18 1052 (!) 89/44     Pulse Rate 08/17/18 1052 81     Resp 08/17/18 1052 18     Temp 08/17/18 1052 98.4 F (36.9 C)     Temp Source 08/17/18 1052 Temporal     SpO2 08/17/18 1052 97 %     Weight --      Height --      Head Circumference --      Peak Flow --      Pain Score 08/17/18 1047 9     Pain Loc --      Pain Edu? --      Excl. in GC? --    No data found.  Updated Vital Signs BP 112/84 (BP Location: Left Arm) Comment: regular cuff Comment (BP Location): regular cuff, forearm  Pulse 81   Temp 98.4 F (36.9 C) (Temporal)   Resp 18   LMP 08/17/2018   SpO2 97%   Visual Acuity Right Eye Distance:   Left Eye Distance:   Bilateral Distance:    Right Eye Near:   Left Eye Near:    Bilateral Near:     Physical Exam Vitals signs and nursing note reviewed.  Constitutional:      General: She is in acute distress.     Appearance: She is ill-appearing.  HENT:     Head: Normocephalic.     Right Ear: Tympanic membrane normal. There is no impacted cerumen.     Left Ear: Tympanic membrane normal. There is no impacted cerumen.     Nose: Congestion present.     Mouth/Throat:     Mouth: Mucous membranes are moist.     Pharynx: Oropharyngeal exudate and posterior oropharyngeal erythema present.  Eyes:     Extraocular Movements: Extraocular movements intact.     Pupils: Pupils are equal, round, and reactive to light.  Neck:     Musculoskeletal: Normal range of motion.  Cardiovascular:     Rate and Rhythm: Normal rate and regular rhythm.     Pulses: Normal pulses.  Pulmonary:     Effort: Pulmonary effort is normal. No respiratory distress.     Breath sounds: Normal breath sounds. No rhonchi or rales.  Abdominal:     General: Abdomen is flat. Bowel sounds are normal. There is no distension.     Palpations: Abdomen is soft. There is no mass.     Tenderness: There is no abdominal tenderness.    Musculoskeletal: Normal range of motion.  Lymphadenopathy:     Cervical: Cervical adenopathy present.  Skin:    General: Skin is warm.     Capillary Refill: Capillary  refill takes less than 2 seconds.     Findings: No erythema or rash.  Neurological:     General: No focal deficit present.     Mental Status: She is alert and oriented to person, place, and time.     Cranial Nerves: No cranial nerve deficit.     Motor: No weakness.     Gait: Gait normal.      UC Treatments / Results  Labs (all labs ordered are listed, but only abnormal results are displayed) Labs Reviewed  POCT RAPID STREP A - Abnormal; Notable for the following components:      Result Value   Streptococcus, Group A Screen (Direct) POSITIVE (*)    All other components within normal limits    EKG None  Radiology No results found.  Procedures Procedures (including critical care time)  Medications Ordered in UC Medications  sodium chloride 0.9 % bolus 1,000 mL (1,000 mLs Intravenous New Bag/Given 08/17/18 1126)    Initial Impression / Assessment and Plan / UC Course  I have reviewed the triage vital signs and the nursing notes.  Pertinent labs & imaging results that were available during my care of the patient were reviewed by me and considered in my medical decision making (see chart for details).     1.  Strep pharyngitis:  Penicillin V 500 mg TID Cepacol lozenges Tylenol for fever/chills Encourage oral fluid intake  2.  Syncope secondary to orthostatic hypotension/dehydration: Normal saline bolus 1 L Orthostatic vitals after IV fluids   Final Clinical Impressions(s) / UC Diagnoses   Final diagnoses:  None   Discharge Instructions   None    ED Prescriptions    None     Controlled Substance Prescriptions [place] Controlled Substance Registry consulted? No   Merrilee JanskyLamptey, Loany Neuroth O, MD 08/17/18 1140

## 2018-12-07 DIAGNOSIS — F1290 Cannabis use, unspecified, uncomplicated: Secondary | ICD-10-CM | POA: Insufficient documentation

## 2018-12-07 DIAGNOSIS — O98313 Other infections with a predominantly sexual mode of transmission complicating pregnancy, third trimester: Secondary | ICD-10-CM | POA: Insufficient documentation

## 2019-10-17 ENCOUNTER — Other Ambulatory Visit: Payer: Self-pay

## 2019-10-17 ENCOUNTER — Encounter (HOSPITAL_COMMUNITY): Payer: Self-pay

## 2019-10-17 ENCOUNTER — Ambulatory Visit (HOSPITAL_COMMUNITY)
Admission: EM | Admit: 2019-10-17 | Discharge: 2019-10-17 | Disposition: A | Discharge status: Home / Self Care | Payer: BLUE CROSS/BLUE SHIELD

## 2019-10-17 DIAGNOSIS — N898 Other specified noninflammatory disorders of vagina: Secondary | ICD-10-CM | POA: Insufficient documentation

## 2019-10-17 DIAGNOSIS — R0789 Other chest pain: Secondary | ICD-10-CM | POA: Insufficient documentation

## 2019-10-17 MED ORDER — METRONIDAZOLE 500 MG PO TABS: 500.0000 mg | ORAL_TABLET | Freq: Two times a day (BID) | ORAL | 0 refills | Status: DC

## 2019-10-17 NOTE — ED Triage Notes (Signed)
Pt is here with chest soreness that started 3 days ago & vaginal discharge that started 2 days ago. Pt states she has a hx of BV & has a new sex partner.

## 2019-10-17 NOTE — Discharge Instructions (Addendum)
Treating you for BV based on symptoms and history of BV.  We are also checking you for STDs with a swab I believe that your chest pain is most likely muscle soreness. You can take ibuprofen for the pain Follow up as needed for continued or worsening symptoms

## 2019-10-18 LAB — CERVICOVAGINAL ANCILLARY ONLY
Bacterial Vaginitis (gardnerella): POSITIVE — AB
Candida Glabrata: NEGATIVE
Candida Vaginitis: NEGATIVE
Chlamydia: NEGATIVE
Comment: NEGATIVE
Comment: NEGATIVE
Comment: NEGATIVE
Comment: NEGATIVE
Comment: NEGATIVE
Comment: NORMAL
Neisseria Gonorrhea: NEGATIVE
Trichomonas: POSITIVE — AB

## 2019-10-18 NOTE — ED Provider Notes (Signed)
[place]    CSN: [id number] Arrival date & time: 10/17/19  1319      History   Chief Complaint Chief Complaint  Patient presents with  . Vaginal Discharge  . chest soreness    HPI Kelsey Carroll is a 24 y.o. female.   Patient is a 23-year female presents today with vaginal discharge that started 2 days ago.  Reporting history of BV.  Describes the discharge as white, milky without odor.  Also stating she has a new sexual partner and would like to be checked for STDs.  No associated abdominal pain, back pain, dysuria, hematuria urinary frequency.  No itching or irritation.  She has also has some mild generalized chest soreness, upper back soreness and rib soreness.  She did recently start back working out in the gym.  This started a couple days ago after working out.  The symptoms have somewhat improved since.  She has been doing some lifting and cardio.  No nausea, vomiting or diaphoresis.  No cough, chest congestion, fever, nasal ingestion or rhinorrhea.  ROS per HPI      Past Medical History:  Diagnosis Date  . Chronic back pain   . Obesity     Patient Active Problem List   Diagnosis Date Noted  . Depression 01/14/2013  . Hydradenitis 02/29/2012  . Impaired hearing 02/29/2012  . Obesity (BMI 35.0-39.9 without comorbidity) 02/29/2012    History reviewed. No pertinent surgical history.  OB History    Gravida  1   Para      Term      Preterm      AB  1   Living        SAB      TAB  1   Ectopic      Multiple      Live Births               Home Medications    Prior to Admission medications   Medication Sig Start Date End Date Taking? Authorizing Provider  clindamycin (CLEOCIN T) 1 % lotion clindamycin 1 % lotion  APP EXT AA BID    [provider]  doxycycline (VIBRAMYCIN) 100 MG capsule doxycycline hyclate 100 mg capsule  TK 1 C PO BID    [provider]  ibuprofen (ADVIL,MOTRIN) 400 MG tablet Take 1  tablet (400 mg total) by mouth every 6 (six) hours as needed. 08/17/18   Lamptey, Myrene Galas, MD  metroNIDAZOLE (FLAGYL) 500 MG tablet Take 1 tablet (500 mg total) by mouth 2 (two) times daily. 10/17/19   Orvan July, NP  Norethindrone-Ethinyl Estradiol-Fe Biphas (LO LOESTRIN FE) 1 MG-10 MCG / 10 MCG tablet Lo Loestrin Fe 1 mg-10 mcg (24)/10 mcg (2) tablet  TK 1 T PO QD    [provider]  ondansetron (ZOFRAN ODT) 4 MG disintegrating tablet Take 1 tablet (4 mg total) by mouth every 8 (eight) hours as needed for nausea or vomiting. 08/17/18   Chase Picket, MD    Family History Family History  Problem Relation Age of Onset  . Hypertension Mother 31  . Anxiety disorder Mother   . Hyperlipidemia Father 73  . Stroke Maternal Grandmother   . Anxiety disorder Sister     Social History Social History   Tobacco Use  . Smoking status: Never Smoker  . Smokeless tobacco: Never Used  Substance Use Topics  . Alcohol use: Yes  . Drug use: Yes  Types: Marijuana     Allergies   Patient has no known allergies.   Review of Systems Review of Systems   Physical Exam Triage Vital Signs ED Triage Vitals  Enc Vitals Group     BP 10/17/19 1345 (!) 119/55     Pulse Rate 10/17/19 1345 83     Resp 10/17/19 1345 19     Temp 10/17/19 1345 98.3 F (36.8 C)     Temp Source 10/17/19 1345 Oral     SpO2 10/17/19 1345 100 %     Weight --      Height --      Head Circumference --      Peak Flow --      Pain Score 10/17/19 1342 0     Pain Loc --      Pain Edu? --      Excl. in GC? --    No data found.  Updated Vital Signs BP (!) 119/55 (BP Location: Right Arm)   Pulse 83   Temp 98.3 F (36.8 C) (Oral)   Resp 19   LMP 09/23/2019   SpO2 100%   Visual Acuity Right Eye Distance:   Left Eye Distance:   Bilateral Distance:    Right Eye Near:   Left Eye Near:    Bilateral Near:     Physical Exam Vitals and nursing note reviewed.  Constitutional:      General: She is not  in acute distress.    Appearance: Normal appearance. She is not ill-appearing, toxic-appearing or diaphoretic.  HENT:     Head: Normocephalic.     Nose: Nose normal.  Eyes:     Conjunctiva/sclera: Conjunctivae normal.  Cardiovascular:     Rate and Rhythm: Normal rate and regular rhythm.  Pulmonary:     Effort: Pulmonary effort is normal.     Breath sounds: Normal breath sounds.  Chest:       Comments: mildly tender to chest wall Musculoskeletal:        General: Normal range of motion.     Cervical back: Normal range of motion.  Skin:    General: Skin is warm and dry.     Findings: No rash.  Neurological:     Mental Status: She is alert.  Psychiatric:        Mood and Affect: Mood normal.      UC Treatments / Results  Labs (all labs ordered are listed, but only abnormal results are displayed) Labs Reviewed  CERVICOVAGINAL ANCILLARY ONLY    EKG   Radiology No results found.  Procedures Procedures (including critical care time)  Medications Ordered in UC Medications - No data to display  Initial Impression / Assessment and Plan / UC Course  I have reviewed the triage vital signs and the nursing notes.  Pertinent labs & imaging results that were available during my care of the patient were reviewed by me and considered in my medical decision making (see chart for details).     Vaginal discharge-treating for BV based on symptoms and history of BV.  Also sending swab for STD testing.  Chest wall pain-most likely soreness from exercising and lifting heavy. Recommend ibuprofen as needed for the pain.  Follow up as needed for continued or worsening symptoms  Final Clinical Impressions(s) / UC Diagnoses   Final diagnoses:  Vaginal discharge  Chest wall pain     Discharge Instructions     Treating you for BV based on symptoms and history of  BV.  We are also checking you for STDs with a swab I believe that your chest pain is most likely muscle  soreness. You can take ibuprofen for the pain Follow up as needed for continued or worsening symptoms     ED Prescriptions    Medication Sig Dispense Auth. Provider   metroNIDAZOLE (FLAGYL) 500 MG tablet Take 1 tablet (500 mg total) by mouth 2 (two) times daily. 14 tablet Praneel Haisley A, NP     PDMP not reviewed this encounter.   Dahlia Byes A, NP 10/18/19 1049

## 2019-12-28 ENCOUNTER — Other Ambulatory Visit (INDEPENDENT_AMBULATORY_CARE_PROVIDER_SITE_OTHER): Payer: Medicaid Other

## 2019-12-28 ENCOUNTER — Other Ambulatory Visit (HOSPITAL_COMMUNITY)
Admission: RE | Admit: 2019-12-28 | Discharge: 2019-12-28 | Disposition: A | Discharge status: Home / Self Care | Payer: Medicaid Other | Source: Ambulatory Visit | Attending: Internal Medicine | Admitting: Internal Medicine

## 2019-12-28 ENCOUNTER — Other Ambulatory Visit: Payer: Self-pay

## 2019-12-28 DIAGNOSIS — Z3009 Encounter for other general counseling and advice on contraception: Secondary | ICD-10-CM

## 2019-12-28 DIAGNOSIS — Z113 Encounter for screening for infections with a predominantly sexual mode of transmission: Secondary | ICD-10-CM | POA: Insufficient documentation

## 2019-12-28 LAB — POCT URINE PREGNANCY: Preg Test, Ur: NEGATIVE

## 2019-12-31 ENCOUNTER — Encounter: Payer: Self-pay | Admitting: Internal Medicine

## 2019-12-31 ENCOUNTER — Telehealth (INDEPENDENT_AMBULATORY_CARE_PROVIDER_SITE_OTHER): Payer: Self-pay | Admitting: Internal Medicine

## 2019-12-31 ENCOUNTER — Other Ambulatory Visit: Payer: Self-pay | Admitting: Internal Medicine

## 2019-12-31 DIAGNOSIS — N760 Acute vaginitis: Secondary | ICD-10-CM

## 2019-12-31 DIAGNOSIS — B9689 Other specified bacterial agents as the cause of diseases classified elsewhere: Secondary | ICD-10-CM

## 2019-12-31 DIAGNOSIS — Z7689 Persons encountering health services in other specified circumstances: Secondary | ICD-10-CM

## 2019-12-31 DIAGNOSIS — Z3041 Encounter for surveillance of contraceptive pills: Secondary | ICD-10-CM

## 2019-12-31 LAB — CERVICOVAGINAL ANCILLARY ONLY
Bacterial Vaginitis (gardnerella): POSITIVE — AB
Candida Glabrata: NEGATIVE
Candida Vaginitis: NEGATIVE
Chlamydia: NEGATIVE
Comment: NEGATIVE
Comment: NEGATIVE
Comment: NEGATIVE
Comment: NEGATIVE
Comment: NEGATIVE
Comment: NORMAL
Neisseria Gonorrhea: NEGATIVE
Trichomonas: NEGATIVE

## 2019-12-31 MED ORDER — BORIC ACID POWD: 0 refills | Status: DC

## 2019-12-31 MED ORDER — NORETHINDRONE ACET-ETHINYL EST 1-20 MG-MCG PO TABS: 1.0000 | ORAL_TABLET | Freq: Every day | ORAL | 11 refills | Status: DC

## 2019-12-31 MED ORDER — METRONIDAZOLE 500 MG PO TABS: 500.0000 mg | ORAL_TABLET | Freq: Two times a day (BID) | ORAL | 0 refills | Status: DC

## 2019-12-31 NOTE — Progress Notes (Signed)
Virtual Visit via Telephone Note  I connected with Kelsey Carroll, on 12/31/2019 at 2:39 PM by telephone due to the COVID-19 pandemic and verified that I am speaking with the correct person using two identifiers.   Consent: I discussed the limitations, risks, security and privacy concerns of performing an evaluation and management service by telephone and the availability of in person appointments. I also discussed with the patient that there may be a patient responsible charge related to this service. The patient expressed understanding and agreed to proceed.   Location of Patient: Home   Location of Provider: Clinic    Persons participating in Telemedicine visit: Nejla Reasor [facility] Dr. Earlene Plater      History of Present Illness: Patient has a visit to establish care. She has no significant PMH. She had a TAB with D&C done at the age of 19. No other surgeries. No OTC medications or supplements.   She has concerns about recurrent BV. Reports she has had at least 3 infections this year and has taken flagyl each time. She just had a swab done at our office on 6/18 that was positive for BV.   Wants to talk about birth control options. She was previously on OCPs. Reports that she was taking Lo Loestrin. She had problems with break through bleeding. Denies history of migraines, HTN, and VTEs. No cigarette smoking. Last sexual intercourse was Saturday, used a condom.    Past Medical History:  Diagnosis Date  . Chronic back pain   . Obesity    No Known Allergies  Current Outpatient Medications on File Prior to Visit  Medication Sig Dispense Refill  . clindamycin (CLEOCIN T) 1 % lotion clindamycin 1 % lotion  APP EXT AA BID    . Norethindrone-Ethinyl Estradiol-Fe Biphas (LO LOESTRIN FE) 1 MG-10 MCG / 10 MCG tablet Lo Loestrin Fe 1 mg-10 mcg (24)/10 mcg (2) tablet  TK 1 T PO QD     No current facility-administered medications on file prior to visit.     Observations/Objective: NAD. Speaking clearly.  Work of breathing normal.  Alert and oriented. Mood appropriate.   Assessment and Plan: 1. Encounter to establish care Reviewed patient's PMH, social history, surgical history, and medications.  Is overdue for annual exam, screening blood work, and health maintenance topics. Have asked patient to return for visit to address these items.   2. Encounter for surveillance of contraceptive pills Suspect breakthrough bleeding was related to low dose of estrogen. Will prescribe OCPs with increased estrogen content and patient to monitor her bleeding pattern.  Counseled on how to start Rx and potential need for back up method depending on start date. Not current smoker. No history of migraine with aura, HTN, or VTE.  - norethindrone-ethinyl estradiol (LOESTRIN) 1-20 MG-MCG tablet; Take 1 tablet by mouth daily.  Dispense: 30 tablet; Refill: 11  3. BV (bacterial vaginosis), recurrent  Sent in Rx for Flagyl from result in inbasket. Paitent reporting recurrent BV. Treat with Flagyl followed by nightly boric acid x30 days.  - Boric Acid POWD; Place one vaginal suppository nightly for 30 days.  Dispense: 1000 g; Refill: 0   Follow Up Instructions: Annual exam with PAP    I discussed the assessment and treatment plan with the patient. The patient was provided an opportunity to ask questions and all were answered. The patient agreed with the plan and demonstrated an understanding of the instructions.   The patient was advised to call back or seek an  in-person evaluation if the symptoms worsen or if the condition fails to improve as anticipated.     I provided 28 minutes total of non-face-to-face time during this encounter including median intraservice time, reviewing previous notes, investigations, ordering medications, medical decision making, coordinating care and patient verbalized understanding at the end of the visit.    Phill Myron,  D.O. Primary Care at [facility]  12/31/2019, 2:39 PM

## 2019-12-31 NOTE — Progress Notes (Signed)
Patient notified of results & recommendations. Expressed understanding.

## 2020-02-13 ENCOUNTER — Telehealth: Payer: Self-pay

## 2020-02-13 NOTE — Telephone Encounter (Signed)
Called patient to do their pre-visit COVID screening.  Call went to voicemail. Unable to do prescreening.  

## 2020-02-14 ENCOUNTER — Other Ambulatory Visit (HOSPITAL_COMMUNITY)
Admission: RE | Admit: 2020-02-14 | Discharge: 2020-02-14 | Disposition: A | Discharge status: Home / Self Care | Payer: Medicaid Other | Source: Ambulatory Visit | Attending: Internal Medicine | Admitting: Internal Medicine

## 2020-02-14 ENCOUNTER — Ambulatory Visit (INDEPENDENT_AMBULATORY_CARE_PROVIDER_SITE_OTHER): Payer: Self-pay | Admitting: Internal Medicine

## 2020-02-14 ENCOUNTER — Other Ambulatory Visit: Payer: Self-pay

## 2020-02-14 ENCOUNTER — Encounter: Payer: Self-pay | Admitting: Internal Medicine

## 2020-02-14 VITALS — BP 119/74 | HR 67 | Temp 97.3°F | Resp 17 | Wt 182.0 lb

## 2020-02-14 DIAGNOSIS — Z0000 Encounter for general adult medical examination without abnormal findings: Secondary | ICD-10-CM

## 2020-02-14 DIAGNOSIS — N898 Other specified noninflammatory disorders of vagina: Secondary | ICD-10-CM

## 2020-02-14 DIAGNOSIS — Z124 Encounter for screening for malignant neoplasm of cervix: Secondary | ICD-10-CM | POA: Diagnosis present

## 2020-02-14 DIAGNOSIS — Z113 Encounter for screening for infections with a predominantly sexual mode of transmission: Secondary | ICD-10-CM | POA: Insufficient documentation

## 2020-02-14 DIAGNOSIS — Z1159 Encounter for screening for other viral diseases: Secondary | ICD-10-CM

## 2020-02-14 NOTE — Progress Notes (Signed)
Subjective:    Kelsey Carroll - 24 y.o. female MRN [id number]  Date of birth: April 19, 1996  HPI  Kelsey Carroll is here for annual exam. Does have some concerns about vaginal discharge. Seemed thicker than usual but not odorous or bothersome. Has been resolving on its own. Has had one prior PAP and it was negative.    Health Maintenance:  Health Maintenance Due  Topic Date Due   Hepatitis C Screening  Never done   COVID-19 Vaccine (1) Never done   PAP-Cervical Cytology Screening  Never done   PAP SMEAR-Modifier  Never done   INFLUENZA VACCINE  02/10/2020    -  reports that she has never smoked. She has never used smokeless tobacco. - Review of Systems: Per HPI. - Past Medical History: Patient Active Problem List   Diagnosis Date Noted   Depression 01/14/2013   Hydradenitis 02/29/2012   Impaired hearing 02/29/2012   Obesity (BMI 35.0-39.9 without comorbidity) 02/29/2012   - Medications: reviewed and updated   Objective:   Physical Exam BP 119/74    Pulse 67    Temp (!) 97.3 F (36.3 C) (Temporal)    Resp 17    Wt 182 lb (82.6 kg)    LMP 01/23/2020    SpO2 98%    BMI 29.38 kg/m  Physical Exam Constitutional:      Appearance: She is not diaphoretic.  HENT:     Head: Normocephalic and atraumatic.  Eyes:     Conjunctiva/sclera: Conjunctivae normal.     Pupils: Pupils are equal, round, and reactive to light.  Neck:     Thyroid: No thyromegaly.  Cardiovascular:     Rate and Rhythm: Normal rate and regular rhythm.     Heart sounds: Normal heart sounds. No murmur heard.   Pulmonary:     Effort: Pulmonary effort is normal. No respiratory distress.     Breath sounds: Normal breath sounds. No wheezing.  Abdominal:     General: Bowel sounds are normal. There is no distension.     Palpations: Abdomen is soft.     Tenderness: There is no abdominal tenderness. There is no guarding or rebound.  Genitourinary:    Comments: GU/GYN: Exam performed in the presence of  a chaperone. External genitalia within normal limits.  Vaginal mucosa pink, moist, normal rugae.  Nonfriable cervix without lesions, no discharge or bleeding noted on speculum exam.  Bimanual exam revealed normal, nongravid uterus.  No cervical motion tenderness. No adnexal masses bilaterally.    Musculoskeletal:        General: No deformity. Normal range of motion.     Cervical back: Normal range of motion and neck supple.  Lymphadenopathy:     Cervical: No cervical adenopathy.  Skin:    General: Skin is warm and dry.     Findings: No rash.  Neurological:     Mental Status: She is alert and oriented to person, place, and time.     Gait: Gait is intact.  Psychiatric:        Mood and Affect: Mood and affect normal.        Judgment: Judgment normal.            Assessment & Plan:   1. Annual physical exam Counseled on 150 minutes of exercise per week, healthy eating (including decreased daily intake of saturated fats, cholesterol, added sugars, sodium), STI prevention, routine healthcare maintenance. - CBC with Differential - Comprehensive metabolic panel - Lipid Panel  2. Pap  smear for cervical cancer screening - Cytology - PAP([place])  3. Screening for STDs (sexually transmitted diseases) - Cervicovaginal ancillary only - RPR - HIV antibody (with reflex)  4. Need for hepatitis C screening test - HCV Ab w/Rflx to Verification  5. Vaginal discharge - Cervicovaginal ancillary only   Marcy Siren, D.O. 02/14/2020, 9:52 AM Primary Care at [facility]

## 2020-02-15 LAB — CERVICOVAGINAL ANCILLARY ONLY
Bacterial Vaginitis (gardnerella): POSITIVE — AB
Candida Glabrata: NEGATIVE
Candida Vaginitis: POSITIVE — AB
Chlamydia: NEGATIVE
Comment: NEGATIVE
Comment: NEGATIVE
Comment: NEGATIVE
Comment: NEGATIVE
Comment: NEGATIVE
Comment: NORMAL
Neisseria Gonorrhea: NEGATIVE
Trichomonas: NEGATIVE

## 2020-02-15 LAB — COMPREHENSIVE METABOLIC PANEL
ALT: 15 IU/L (ref 0–32)
AST: 14 IU/L (ref 0–40)
Albumin/Globulin Ratio: 1.6 (ref 1.2–2.2)
Albumin: 4.5 g/dL (ref 3.9–5.0)
Alkaline Phosphatase: 45 IU/L — ABNORMAL LOW (ref 48–121)
BUN/Creatinine Ratio: 13 (ref 9–23)
BUN: 12 mg/dL (ref 6–20)
Bilirubin Total: 0.3 mg/dL (ref 0.0–1.2)
CO2: 23 mmol/L (ref 20–29)
Calcium: 9.4 mg/dL (ref 8.7–10.2)
Chloride: 105 mmol/L (ref 96–106)
Creatinine, Ser: 0.9 mg/dL (ref 0.57–1.00)
GFR calc Af Amer: 103 mL/min/{1.73_m2} (ref >59)
GFR calc non Af Amer: 90 mL/min/{1.73_m2} (ref >59)
Globulin, Total: 2.9 g/dL (ref 1.5–4.5)
Glucose: 80 mg/dL (ref 65–99)
Potassium: 4 mmol/L (ref 3.5–5.2)
Sodium: 140 mmol/L (ref 134–144)
Total Protein: 7.4 g/dL (ref 6.0–8.5)

## 2020-02-15 LAB — CYTOLOGY - PAP
Adequacy: ABSENT
Diagnosis: NEGATIVE

## 2020-02-15 LAB — CBC WITH DIFFERENTIAL/PLATELET
Basophils Absolute: 0 10*3/uL (ref 0.0–0.2)
Basos: 1 %
EOS (ABSOLUTE): 0.2 10*3/uL (ref 0.0–0.4)
Eos: 4 %
Hematocrit: 39.3 % (ref 34.0–46.6)
Hemoglobin: 12.6 g/dL (ref 11.1–15.9)
Immature Grans (Abs): 0 10*3/uL (ref 0.0–0.1)
Immature Granulocytes: 0 %
Lymphocytes Absolute: 2.1 10*3/uL (ref 0.7–3.1)
Lymphs: 47 %
MCH: 29.1 pg (ref 26.6–33.0)
MCHC: 32.1 g/dL (ref 31.5–35.7)
MCV: 91 fL (ref 79–97)
Monocytes Absolute: 0.5 10*3/uL (ref 0.1–0.9)
Monocytes: 11 %
Neutrophils Absolute: 1.7 10*3/uL (ref 1.4–7.0)
Neutrophils: 37 %
Platelets: 297 10*3/uL (ref 150–450)
RBC: 4.33 x10E6/uL (ref 3.77–5.28)
RDW: 12.9 % (ref 11.7–15.4)
WBC: 4.5 10*3/uL (ref 3.4–10.8)

## 2020-02-15 LAB — LIPID PANEL
Chol/HDL Ratio: 2.8 ratio (ref 0.0–4.4)
Cholesterol, Total: 151 mg/dL (ref 100–199)
HDL: 53 mg/dL (ref >39)
LDL Chol Calc (NIH): 89 mg/dL (ref 0–99)
Triglycerides: 37 mg/dL (ref 0–149)
VLDL Cholesterol Cal: 9 mg/dL (ref 5–40)

## 2020-02-15 LAB — HCV INTERPRETATION

## 2020-02-15 LAB — HCV AB W/RFLX TO VERIFICATION: HCV Ab: <0.1 s/co ratio (ref 0.0–0.9)

## 2020-02-15 LAB — RPR: RPR Ser Ql: NONREACTIVE

## 2020-02-15 LAB — HIV ANTIBODY (ROUTINE TESTING W REFLEX): HIV Screen 4th Generation wRfx: NONREACTIVE

## 2020-02-18 ENCOUNTER — Other Ambulatory Visit: Payer: Self-pay | Admitting: Internal Medicine

## 2020-02-18 MED ORDER — METRONIDAZOLE 500 MG PO TABS: 500.0000 mg | ORAL_TABLET | Freq: Two times a day (BID) | ORAL | 0 refills | Status: DC

## 2020-02-18 MED ORDER — FLUCONAZOLE 150 MG PO TABS: ORAL_TABLET | ORAL | 0 refills | Status: DC

## 2020-02-22 NOTE — Progress Notes (Signed)
Patient notified of results & recommendations. Expressed understanding.

## 2020-02-25 ENCOUNTER — Ambulatory Visit: Payer: Medicaid Other

## 2020-04-14 IMAGING — CR DG FOREARM 2V*R*
2 series · 2 of 2 positions shown · non-contrast
Comparison: None.

CLINICAL DATA: Dog bite.

EXAM:
RIGHT FOREARM - 2 VIEW

[x forearm ap right]
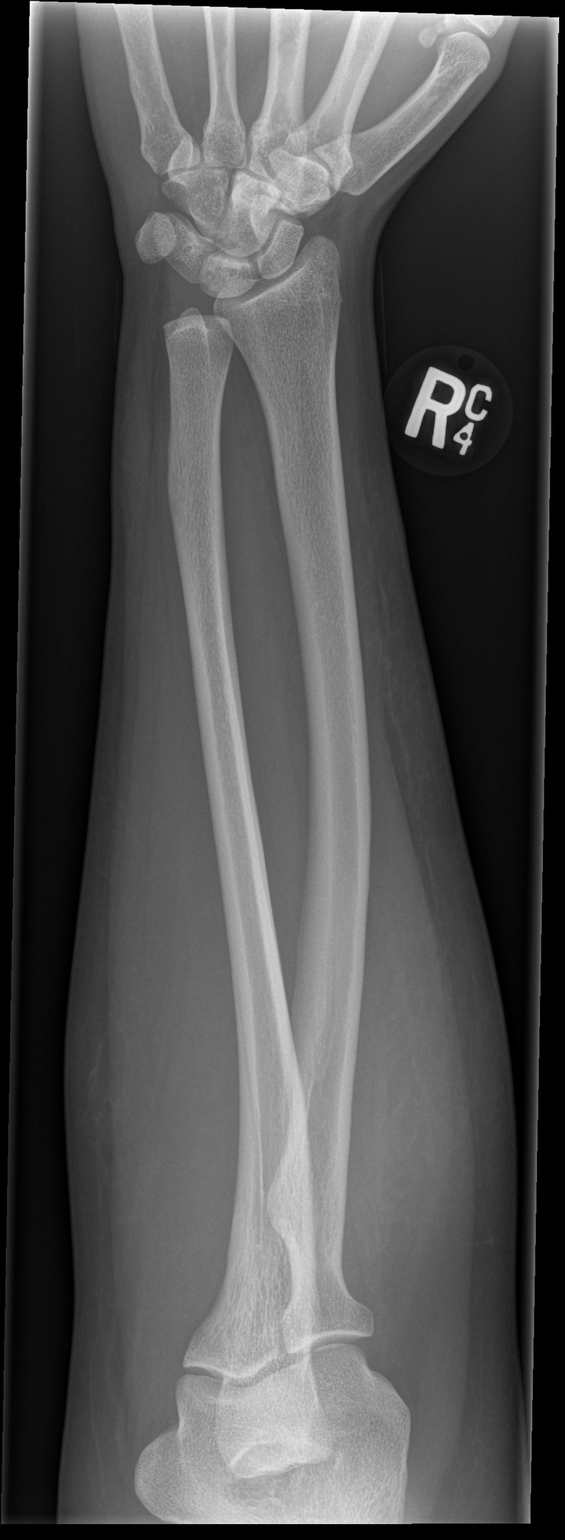

[x forearm lat right]
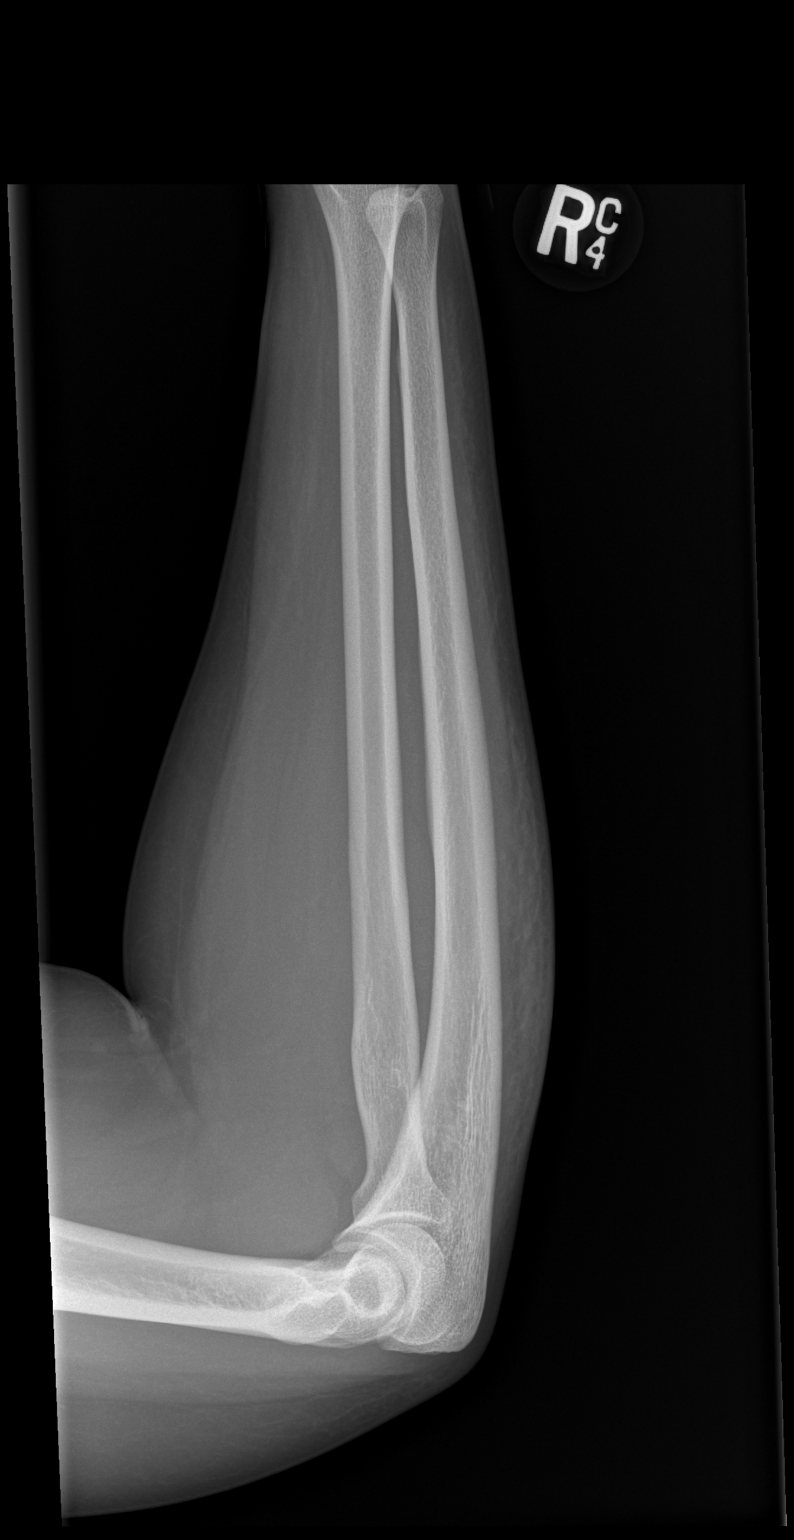

[2 of 2 positions shown; findings below may reference images not displayed]

FINDINGS: There is no evidence of fracture or other focal bone lesions. Mild
soft tissue swelling. No radiopaque foreign body.
IMPRESSION: Mild soft tissue swelling.  No fracture.

## 2020-04-16 DIAGNOSIS — N946 Dysmenorrhea, unspecified: Secondary | ICD-10-CM | POA: Insufficient documentation

## 2020-05-08 ENCOUNTER — Ambulatory Visit: Payer: Medicaid Other

## 2020-07-16 ENCOUNTER — Emergency Department (HOSPITAL_COMMUNITY)
Admission: EM | Admit: 2020-07-16 | Discharge: 2020-07-16 | Disposition: A | Discharge status: Home / Self Care | Payer: HRSA Program | Attending: Emergency Medicine | Admitting: Emergency Medicine

## 2020-07-16 ENCOUNTER — Other Ambulatory Visit: Payer: Self-pay

## 2020-07-16 DIAGNOSIS — U071 COVID-19: Secondary | ICD-10-CM | POA: Diagnosis not present

## 2020-07-16 DIAGNOSIS — R519 Headache, unspecified: Secondary | ICD-10-CM | POA: Diagnosis present

## 2020-07-16 LAB — RESP PANEL BY RT-PCR (FLU A&B, COVID) ARPGX2
Influenza A by PCR: NEGATIVE
Influenza B by PCR: NEGATIVE
SARS Coronavirus 2 by RT PCR: POSITIVE — AB

## 2020-07-16 MED ORDER — ACETAMINOPHEN 325 MG PO TABS
650.0000 mg | ORAL_TABLET | Freq: Once | ORAL | Status: AC | PRN
  Administered 2020-07-16: 650 mg via ORAL
  Filled 2020-07-16: qty 2

## 2020-07-16 NOTE — ED Notes (Signed)
Pt stated she was leaving. Did not want to stay.

## 2020-07-16 NOTE — ED Triage Notes (Signed)
Body aches and headaches since yesterday. 2 coworkers with COVID.

## 2020-08-12 DIAGNOSIS — N898 Other specified noninflammatory disorders of vagina: Secondary | ICD-10-CM | POA: Diagnosis not present

## 2020-09-03 DIAGNOSIS — L732 Hidradenitis suppurativa: Secondary | ICD-10-CM | POA: Diagnosis not present

## 2020-09-03 DIAGNOSIS — Z79899 Other long term (current) drug therapy: Secondary | ICD-10-CM | POA: Diagnosis not present

## 2020-10-06 DIAGNOSIS — N760 Acute vaginitis: Secondary | ICD-10-CM | POA: Diagnosis not present

## 2020-10-06 DIAGNOSIS — B373 Candidiasis of vulva and vagina: Secondary | ICD-10-CM | POA: Diagnosis not present

## 2020-10-06 DIAGNOSIS — Z113 Encounter for screening for infections with a predominantly sexual mode of transmission: Secondary | ICD-10-CM | POA: Diagnosis not present

## 2020-12-23 ENCOUNTER — Encounter (HOSPITAL_BASED_OUTPATIENT_CLINIC_OR_DEPARTMENT_OTHER): Payer: Self-pay | Admitting: Emergency Medicine

## 2020-12-23 ENCOUNTER — Other Ambulatory Visit: Payer: Self-pay

## 2020-12-23 ENCOUNTER — Emergency Department (HOSPITAL_BASED_OUTPATIENT_CLINIC_OR_DEPARTMENT_OTHER)
Admission: EM | Admit: 2020-12-23 | Discharge: 2020-12-23 | Disposition: A | Discharge status: Home / Self Care | Payer: Medicaid Other | Attending: Emergency Medicine | Admitting: Emergency Medicine

## 2020-12-23 DIAGNOSIS — J029 Acute pharyngitis, unspecified: Secondary | ICD-10-CM | POA: Insufficient documentation

## 2020-12-23 DIAGNOSIS — Z8616 Personal history of COVID-19: Secondary | ICD-10-CM | POA: Insufficient documentation

## 2020-12-23 DIAGNOSIS — Z20822 Contact with and (suspected) exposure to covid-19: Secondary | ICD-10-CM | POA: Insufficient documentation

## 2020-12-23 LAB — SARS CORONAVIRUS 2 (TAT 6-24 HRS): SARS Coronavirus 2: NEGATIVE

## 2020-12-23 LAB — GROUP A STREP BY PCR: Group A Strep by PCR: NOT DETECTED

## 2020-12-23 MED ORDER — DEXAMETHASONE SODIUM PHOSPHATE 10 MG/ML IJ SOLN
8.0000 mg | Freq: Once | INTRAMUSCULAR | Status: AC
  Administered 2020-12-23: 8 mg via INTRAMUSCULAR
  Filled 2020-12-23: qty 1

## 2020-12-23 NOTE — ED Triage Notes (Signed)
Sore throat since  Thursday , has had tylenol , last time yesterday , no cough

## 2020-12-23 NOTE — ED Provider Notes (Signed)
[facility] EMERGENCY DEPARTMENT Provider Note   CSN: [id number] Arrival date & time: 12/23/20  0955     History Chief Complaint  Patient presents with   Sore Throat    Kelsey Carroll is a 25 y.o. female.  HPI Patient is a 25 year old female who presents to the emergency department due to a sore throat.  Her symptoms started about 4 days ago.  She states that they were initially improving but began worsening over the past 2 days.  She has been taking Tylenol, drinking warm drinks, and using cough drops with mild short-term relief.  Pain worsens when swallowing.  Denies any rhinorrhea, cough, chest pain, shortness of breath, nausea, vomiting, or diarrhea.  She has been vaccinated for COVID-19 x2 and also notes a previous COVID-19 infection in January of this year.    Past Medical History:  Diagnosis Date   Chronic back pain    Depression    [employer] 02/11/2020   Obesity     Patient Active Problem List   Diagnosis Date Noted   Depression 01/14/2013   Hydradenitis 02/29/2012   Impaired hearing 02/29/2012   Obesity (BMI 35.0-39.9 without comorbidity) 02/29/2012    No past surgical history on file.   OB History     Gravida  1   Para      Term      Preterm      AB  1   Living         SAB      IAB  1   Ectopic      Multiple      Live Births              Family History  Problem Relation Age of Onset   Hypertension Mother 29   Anxiety disorder Mother    Hyperlipidemia Father 18   Stroke Maternal Grandmother    Anxiety disorder Sister     Social History   Tobacco Use   Smoking status: Never   Smokeless tobacco: Never  Substance Use Topics   Alcohol use: Yes   Drug use: Yes    Types: Marijuana    Home Medications Prior to Admission medications   Medication Sig Start Date End Date Taking? Authorizing Provider  clindamycin (CLEOCIN T) 1 % lotion clindamycin 1 % lotion  APP EXT AA BID    [provider]   Levonorgestrel-Ethinyl Estradiol (SIMPESSE) 0.15-0.03 &0.01 MG tablet Take 1 tablet by mouth daily. 04/16/20   [provider]    Allergies    Patient has no allergy information on record.  Review of Systems   Review of Systems  Constitutional:  Negative for fever.  HENT:  Positive for sore throat. Negative for congestion and postnasal drip.   Respiratory:  Negative for cough and shortness of breath.   Cardiovascular:  Negative for chest pain.  Gastrointestinal:  Negative for diarrhea, nausea and vomiting.   Physical Exam Updated Vital Signs BP 117/84 (BP Location: Right Arm)   Pulse 88   Temp 98.3 F (36.8 C)   Resp 18   Ht 5\' 6"  (1.676 m)   Wt 90.7 kg   SpO2 99%   BMI 32.28 kg/m   Physical Exam Vitals and nursing note reviewed.  Constitutional:      General: She is not in acute distress.    Appearance: Normal appearance. She is not ill-appearing, toxic-appearing or diaphoretic.  HENT:     Head: Normocephalic and atraumatic.  Right Ear: External ear normal.     Left Ear: External ear normal.     Ears:     Comments: External ears normal.    Nose: Nose normal.     Mouth/Throat:     Mouth: Mucous membranes are moist.     Pharynx: Oropharynx is clear. Uvula midline. Posterior oropharyngeal erythema present. No oropharyngeal exudate.     Tonsils: No tonsillar exudate or tonsillar abscesses. 1+ on the right. 1+ on the left.     Comments: Uvula midline.  Mild symmetric tonsillar hypertrophy.  Mild erythema noted in the posterior oropharynx.  No exudates.  Readily handling secretions.  No stridor.  No hot potato voice. Eyes:     Extraocular Movements: Extraocular movements intact.  Cardiovascular:     Rate and Rhythm: Normal rate and regular rhythm.     Pulses: Normal pulses.     Heart sounds: Normal heart sounds. No murmur heard.   No friction rub. No gallop.     Comments: Regular rate and rhythm without murmurs, rubs, or gallops. Pulmonary:     Effort:  Pulmonary effort is normal. No respiratory distress.     Breath sounds: Normal breath sounds. No stridor. No wheezing, rhonchi or rales.  Abdominal:     General: Abdomen is flat.     Tenderness: There is no abdominal tenderness.  Musculoskeletal:        General: Normal range of motion.     Cervical back: Normal range of motion and neck supple. No tenderness.  Skin:    General: Skin is warm and dry.  Neurological:     General: No focal deficit present.     Mental Status: She is alert and oriented to person, place, and time.  Psychiatric:        Mood and Affect: Mood normal.        Behavior: Behavior normal.   ED Results / Procedures / Treatments   Labs (all labs ordered are listed, but only abnormal results are displayed) Labs Reviewed  GROUP A STREP BY PCR  SARS CORONAVIRUS 2 (TAT 6-24 HRS)   EKG None  Radiology No results found.  Procedures Procedures   Medications Ordered in ED Medications  dexamethasone (DECADRON) injection 8 mg (has no administration in time range)   ED Course  I have reviewed the triage vital signs and the nursing notes.  Pertinent labs & imaging results that were available during my care of the patient were reviewed by me and considered in my medical decision making (see chart for details).    MDM Rules/Calculators/A&P                          Patient is a 25 year old female who presents to the emergency department due to what appears to be symptoms related to viral pharyngitis.  Physical exam reassuring.  Vital signs normal limits.  Mild erythema noted in the posterior oropharynx without exudates.  Mild symmetric tonsillar hypertrophy.  No unilateral tonsillar hypertrophy.  Uvula midline.  Readily handling secretions.  No hot potato voice.  Doubt PTA at this time.  Rapid strep test is negative.  Patient given a dose of IM Decadron for management of her symptoms.  She denies a history of diabetes.  Also obtained a COVID-19 test.  She is going  to check these results on MyChart later today.  Patient understands to quarantine based on CDC's current guidelines if she finds that she is positive.  Feel the patient is stable for discharge at this time and she is agreeable.  Discussed return precautions at length.  Her questions were answered and she was amicable at the time of discharge.  Final Clinical Impression(s) / ED Diagnoses Final diagnoses:  Viral pharyngitis    Rx / DC Orders ED Discharge Orders     None        Placido Sou, PA-C 12/23/20 1135    Virgina Norfolk, DO 12/23/20 1225

## 2020-12-23 NOTE — Discharge Instructions (Addendum)
Please continue taking Tylenol and ibuprofen for management of your sore throat.  I would also recommend hard candy.  Also consider trying Chloraseptic sore throat medications.  This can be purchased over the counter.  Please check the results of your COVID-19 test later today on MyChart.  If you find you are positive please be sure that you quarantine based on the CDC's current guidelines.  If you develop any new or worsening symptoms please come back to the emergency department.  It was a pleasure to meet you.

## 2021-03-04 DIAGNOSIS — L810 Postinflammatory hyperpigmentation: Secondary | ICD-10-CM | POA: Diagnosis not present

## 2021-03-04 DIAGNOSIS — L732 Hidradenitis suppurativa: Secondary | ICD-10-CM | POA: Diagnosis not present

## 2021-03-04 DIAGNOSIS — L700 Acne vulgaris: Secondary | ICD-10-CM | POA: Diagnosis not present

## 2021-03-04 DIAGNOSIS — Z79899 Other long term (current) drug therapy: Secondary | ICD-10-CM | POA: Diagnosis not present

## 2021-03-09 DIAGNOSIS — Z111 Encounter for screening for respiratory tuberculosis: Secondary | ICD-10-CM | POA: Diagnosis not present

## 2021-04-22 DIAGNOSIS — N761 Subacute and chronic vaginitis: Secondary | ICD-10-CM | POA: Diagnosis not present

## 2021-04-22 DIAGNOSIS — R319 Hematuria, unspecified: Secondary | ICD-10-CM | POA: Diagnosis not present

## 2021-04-22 DIAGNOSIS — Z309 Encounter for contraceptive management, unspecified: Secondary | ICD-10-CM | POA: Diagnosis not present

## 2021-04-22 DIAGNOSIS — N760 Acute vaginitis: Secondary | ICD-10-CM | POA: Diagnosis not present

## 2021-04-22 DIAGNOSIS — Z113 Encounter for screening for infections with a predominantly sexual mode of transmission: Secondary | ICD-10-CM | POA: Diagnosis not present

## 2021-04-22 DIAGNOSIS — Z6834 Body mass index (BMI) 34.0-34.9, adult: Secondary | ICD-10-CM | POA: Diagnosis not present

## 2021-04-22 DIAGNOSIS — Z01419 Encounter for gynecological examination (general) (routine) without abnormal findings: Secondary | ICD-10-CM | POA: Diagnosis not present

## 2021-04-22 DIAGNOSIS — N946 Dysmenorrhea, unspecified: Secondary | ICD-10-CM | POA: Diagnosis not present

## 2021-05-06 DIAGNOSIS — F4321 Adjustment disorder with depressed mood: Secondary | ICD-10-CM | POA: Diagnosis not present

## 2021-06-29 DIAGNOSIS — F4321 Adjustment disorder with depressed mood: Secondary | ICD-10-CM | POA: Diagnosis not present

## 2021-06-30 DIAGNOSIS — N760 Acute vaginitis: Secondary | ICD-10-CM | POA: Diagnosis not present

## 2021-07-16 DIAGNOSIS — F4321 Adjustment disorder with depressed mood: Secondary | ICD-10-CM | POA: Diagnosis not present

## 2021-07-27 ENCOUNTER — Telehealth: Payer: Self-pay | Admitting: Nurse Practitioner

## 2021-07-28 DIAGNOSIS — E559 Vitamin D deficiency, unspecified: Secondary | ICD-10-CM | POA: Diagnosis not present

## 2021-07-28 DIAGNOSIS — R635 Abnormal weight gain: Secondary | ICD-10-CM | POA: Diagnosis not present

## 2021-07-28 DIAGNOSIS — N951 Menopausal and female climacteric states: Secondary | ICD-10-CM | POA: Diagnosis not present

## 2021-07-29 ENCOUNTER — Encounter: Payer: Self-pay | Admitting: Nurse Practitioner

## 2021-07-29 ENCOUNTER — Ambulatory Visit (INDEPENDENT_AMBULATORY_CARE_PROVIDER_SITE_OTHER): Payer: Self-pay | Admitting: Nurse Practitioner

## 2021-07-29 ENCOUNTER — Other Ambulatory Visit: Payer: Self-pay

## 2021-07-29 DIAGNOSIS — K5909 Other constipation: Secondary | ICD-10-CM

## 2021-07-29 DIAGNOSIS — F40243 Fear of flying: Secondary | ICD-10-CM

## 2021-07-29 MED ORDER — DIAZEPAM 5 MG PO TABS: ORAL_TABLET | ORAL | 0 refills | Status: DC

## 2021-07-29 MED ORDER — DOCUSATE SODIUM 100 MG PO CAPS: 100.0000 mg | ORAL_CAPSULE | Freq: Two times a day (BID) | ORAL | 0 refills | Status: DC

## 2021-07-29 NOTE — Progress Notes (Signed)
Virtual Visit via Telephone Note  I connected with Kathie Rhodes on 07/29/21 at  3:40 PM EST by telephone and verified that I am speaking with the correct person using two identifiers.  Location: Patient: home Provider: office   I discussed the limitations, risks, security and privacy concerns of performing an evaluation and management service by telephone and the availability of in person appointments. I also discussed with the patient that there may be a patient responsible charge related to this service. The patient expressed understanding and agreed to proceed.   History of Present Illness:  Patient presents today for telephone visit.  Patient states that she has been having bowel issues for several years now.  She states that she has a hard time going to the bathroom and having bowel movements.  Sometimes she only has 1 bowel movement per week.  She states that she did have a bowel movement yesterday.  She also states that at times her stomach will hurt after eating.  Patient would like a referral to GI for further evaluation for this.  We will place referral per patient request.  We discussed high-fiber diet/supplements and will order stool softener.  Patient also complains today of a fear of flying.  Unfortunately patient's aunt passed away and she has to fly out for the funeral.  She is requesting something to take the edge off her anxiety while flying.  Denies f/c/s, n/v/d, hemoptysis, PND, chest pain or edema.      Observations/Objective:  Vitals with BMI 12/23/2020 12/23/2020 12/23/2020  Height - - 5\' 6"   Weight - - 200 lbs  BMI - - [id number]  Systolic [id number] -  Diastolic 74 84 -  Pulse 60 88 -      Assessment and Plan:  Patient Instructions  Chronic Constipation:  High fiber diet  May take fiber gummy supplements daily  Stay active - may start walking routine  Will place GI referral  Flight anxiety:  Will give 1 time supply of valium for flight. Please take  sparingly.  Follow up:  Follow up with Dr. Redmond Pulling or Amy with 1 month    Chronic Constipation Chronic constipation is a condition in which a person has three or fewer bowel movements a week, for 3 months or longer. This condition is especially common in older adults. What are the causes? Causes of chronic constipation may include: Not drinking enough fluid, eating enough food or fiber, or getting enough physical activity. Pregnancy. A tear in the anus (anal fissure). Blockage in the bowel (bowel obstruction). Narrowing of the bowel (bowel stricture). Having a long-term medical condition, such as: Diabetes, hypothyroidism, or iron-deficiency anemia. Stroke or spinal cord injury. Multiple sclerosis or Parkinson's disease. Colon cancer. Dementia. Inflammatory bowel disease (IBD), outward collapse of the rectum (rectal prolapse), or hemorrhoids. Taking certain medicines, including: Narcotics. These are a certain type of prescription pain medicine. Antacids or iron supplements. Water pills (diuretics). Certain blood pressure medicines. Anti-seizure medicines. Antidepressants. Medicines for Parkinson's disease. Other causes of this condition may include: Stress. Problems in the nerves and muscles that control the movement of stool. Weak or impaired pelvic floor muscles. What increases the risk? You may be at higher risk for chronic constipation if: You are older than age 31. You are female. You live in a long-term care facility. You have a long-term disease. You have a mental health disorder or eating disorder. What are the signs or symptoms? The main symptom of chronic constipation is having three or  fewer bowel movements a week for several weeks. Other signs and symptoms may vary from person to person. These include: Pushing hard (straining) to pass stool, or having hard or lumpy stools. Painful bowel movements. Having lower abdominal discomfort, such as cramps or  bloating. Being unable to have a bowel movement when you feel the urge, or feeling like you still need to pass stool after a bowel movement. Feeling that you have something in your rectum that is blocking or preventing bowel movements. Seeing blood on the toilet paper or in your stool. Worsening confusion (in older adults). How is this diagnosed? This condition may be diagnosed based on: Your symptoms and medical history. You will be asked about your symptoms, lifestyle, diet, and any medicines that you are taking. A physical exam. Your abdomen will be examined. A digital rectal exam may be done. For this exam, a health care provider places a lubricated, gloved finger into the rectum. Tests to check for any underlying causes of your constipation. These may be ordered if you have bleeding in your rectum, weight loss, or a family history of colon cancer. In these cases, you may have: Imaging studies of the colon. These may include X-ray, ultrasound, or a CT scan. Blood tests. A procedure to examine the inside of your colon (colonoscopy). More specialized tests to check: Whether your anal sphincter works well. This is a ring-shaped muscle that controls the closing of the anus. How well food moves through your colon. Tests to measure the nerve signal in your pelvic floor muscles (electromyography). How is this treated? Treatment for chronic constipation depends on the cause. Most often, treatment starts with: Being more active and getting regular exercise. Drinking more fluids. Adding fiber to your diet. Sources of fiber include fruits, vegetables, whole grains, and fiber supplements. Using medicines such as stool softeners or medicines that increase contractions in your digestive system (pro-motility agents). Training your pelvic muscles with biofeedback. Surgery, if there is obstruction. Treatment may also include: Stopping or changing some medicines if they cause constipation. Using a  fiber supplement (bulk laxative) or stool softener. Using a prescription laxative. This works by [employer] into your colon (osmotic laxative). You may also need to see a specialist who treats conditions of the digestive system (gastroenterologist). Follow these instructions at home: Medicines Take over-the-counter and prescription medicines only as told by your health care provider. If you are taking a laxative, take it as told by your health care provider. Eating and drinking  Eat a balanced diet that includes enough fiber. Ask your health care provider to recommend a diet that is right for you. Drink clear fluids, especially water. Avoid drinking alcohol, caffeine, and soda. These can make constipation worse. Drink enough fluid to keep your urine pale yellow. General instructions Get some physical activity every day. Ask your health care provider what activities are safe for you. Get colon cancer screenings as told by your health care provider. Keep all follow-up visits as told by your health care provider. This is important. Contact a health care provider if you have: Three or fewer bowel movements a week. Stools that are hard or lumpy. Blood on the toilet paper or in your stool after you have a bowel movement. Unexplained weight loss. Rectum (rectal) pain. Stool leakage. Nausea or vomiting. Get help right away if you have: Rectal bleeding or you pass blood clots. Severe rectal pain. Body tissue that pushes out (protrudes) from your anus. Severe pain or bloating (distension) in your abdomen.  Vomiting that you cannot control. Summary Chronic constipation is a condition in which a person has three or fewer bowel movements a week, for 3 months or longer. You may have a higher risk for this condition if you are an older adult, you are female, or you have a long-term disease. Treatment for this condition depends on the cause. Most treatments for chronic constipation include  adding fiber to your diet, drinking more fluids, and getting more physical activity. You may also need to treat any underlying medical conditions or stop or change certain medicines if they cause constipation. If lifestyle changes do not relieve constipation, your health care provider may recommend taking a laxative. This information is not intended to replace advice given to you by your health care provider. Make sure you discuss any questions you have with your health care provider. Document Revised: 05/16/2019 Document Reviewed: 05/16/2019 Elsevier Patient Education  2022 [employer].      I discussed the assessment and treatment plan with the patient. The patient was provided an opportunity to ask questions and all were answered. The patient agreed with the plan and demonstrated an understanding of the instructions.   The patient was advised to call back or seek an in-person evaluation if the symptoms worsen or if the condition fails to improve as anticipated.  I provided 23 minutes of non-face-to-face time during this encounter.   Fenton Foy, NP

## 2021-07-29 NOTE — Patient Instructions (Addendum)
Chronic Constipation:  High fiber diet  May take fiber gummy supplements daily  Stay active - may start walking routine  Will place GI referral  Flight anxiety:  Will give 1 time supply of valium for flight. Please take sparingly.  Follow up:  Follow up with Dr. Andrey Campanile or Amy with 1 month    Chronic Constipation Chronic constipation is a condition in which a person has three or fewer bowel movements a week, for 3 months or longer. This condition is especially common in older adults. What are the causes? Causes of chronic constipation may include: Not drinking enough fluid, eating enough food or fiber, or getting enough physical activity. Pregnancy. A tear in the anus (anal fissure). Blockage in the bowel (bowel obstruction). Narrowing of the bowel (bowel stricture). Having a long-term medical condition, such as: Diabetes, hypothyroidism, or iron-deficiency anemia. Stroke or spinal cord injury. Multiple sclerosis or Parkinson's disease. Colon cancer. Dementia. Inflammatory bowel disease (IBD), outward collapse of the rectum (rectal prolapse), or hemorrhoids. Taking certain medicines, including: Narcotics. These are a certain type of prescription pain medicine. Antacids or iron supplements. Water pills (diuretics). Certain blood pressure medicines. Anti-seizure medicines. Antidepressants. Medicines for Parkinson's disease. Other causes of this condition may include: Stress. Problems in the nerves and muscles that control the movement of stool. Weak or impaired pelvic floor muscles. What increases the risk? You may be at higher risk for chronic constipation if: You are older than age 68. You are female. You live in a long-term care facility. You have a long-term disease. You have a mental health disorder or eating disorder. What are the signs or symptoms? The main symptom of chronic constipation is having three or fewer bowel movements a week for several weeks.  Other signs and symptoms may vary from person to person. These include: Pushing hard (straining) to pass stool, or having hard or lumpy stools. Painful bowel movements. Having lower abdominal discomfort, such as cramps or bloating. Being unable to have a bowel movement when you feel the urge, or feeling like you still need to pass stool after a bowel movement. Feeling that you have something in your rectum that is blocking or preventing bowel movements. Seeing blood on the toilet paper or in your stool. Worsening confusion (in older adults). How is this diagnosed? This condition may be diagnosed based on: Your symptoms and medical history. You will be asked about your symptoms, lifestyle, diet, and any medicines that you are taking. A physical exam. Your abdomen will be examined. A digital rectal exam may be done. For this exam, a health care provider places a lubricated, gloved finger into the rectum. Tests to check for any underlying causes of your constipation. These may be ordered if you have bleeding in your rectum, weight loss, or a family history of colon cancer. In these cases, you may have: Imaging studies of the colon. These may include X-ray, ultrasound, or a CT scan. Blood tests. A procedure to examine the inside of your colon (colonoscopy). More specialized tests to check: Whether your anal sphincter works well. This is a ring-shaped muscle that controls the closing of the anus. How well food moves through your colon. Tests to measure the nerve signal in your pelvic floor muscles (electromyography). How is this treated? Treatment for chronic constipation depends on the cause. Most often, treatment starts with: Being more active and getting regular exercise. Drinking more fluids. Adding fiber to your diet. Sources of fiber include fruits, vegetables, whole grains, and fiber supplements.  Using medicines such as stool softeners or medicines that increase contractions in your  digestive system (pro-motility agents). Training your pelvic muscles with biofeedback. Surgery, if there is obstruction. Treatment may also include: Stopping or changing some medicines if they cause constipation. Using a fiber supplement (bulk laxative) or stool softener. Using a prescription laxative. This works by [employer] into your colon (osmotic laxative). You may also need to see a specialist who treats conditions of the digestive system (gastroenterologist). Follow these instructions at home: Medicines Take over-the-counter and prescription medicines only as told by your health care provider. If you are taking a laxative, take it as told by your health care provider. Eating and drinking  Eat a balanced diet that includes enough fiber. Ask your health care provider to recommend a diet that is right for you. Drink clear fluids, especially water. Avoid drinking alcohol, caffeine, and soda. These can make constipation worse. Drink enough fluid to keep your urine pale yellow. General instructions Get some physical activity every day. Ask your health care provider what activities are safe for you. Get colon cancer screenings as told by your health care provider. Keep all follow-up visits as told by your health care provider. This is important. Contact a health care provider if you have: Three or fewer bowel movements a week. Stools that are hard or lumpy. Blood on the toilet paper or in your stool after you have a bowel movement. Unexplained weight loss. Rectum (rectal) pain. Stool leakage. Nausea or vomiting. Get help right away if you have: Rectal bleeding or you pass blood clots. Severe rectal pain. Body tissue that pushes out (protrudes) from your anus. Severe pain or bloating (distension) in your abdomen. Vomiting that you cannot control. Summary Chronic constipation is a condition in which a person has three or fewer bowel movements a week, for 3 months or  longer. You may have a higher risk for this condition if you are an older adult, you are female, or you have a long-term disease. Treatment for this condition depends on the cause. Most treatments for chronic constipation include adding fiber to your diet, drinking more fluids, and getting more physical activity. You may also need to treat any underlying medical conditions or stop or change certain medicines if they cause constipation. If lifestyle changes do not relieve constipation, your health care provider may recommend taking a laxative. This information is not intended to replace advice given to you by your health care provider. Make sure you discuss any questions you have with your health care provider. Document Revised: 05/16/2019 Document Reviewed: 05/16/2019 Elsevier Patient Education  [employer].

## 2021-07-30 DIAGNOSIS — F4321 Adjustment disorder with depressed mood: Secondary | ICD-10-CM | POA: Diagnosis not present

## 2021-07-30 DIAGNOSIS — Z6833 Body mass index (BMI) 33.0-33.9, adult: Secondary | ICD-10-CM | POA: Diagnosis not present

## 2021-07-30 DIAGNOSIS — R635 Abnormal weight gain: Secondary | ICD-10-CM | POA: Diagnosis not present

## 2021-07-30 DIAGNOSIS — Z1331 Encounter for screening for depression: Secondary | ICD-10-CM | POA: Diagnosis not present

## 2021-07-30 DIAGNOSIS — K760 Fatty (change of) liver, not elsewhere classified: Secondary | ICD-10-CM | POA: Diagnosis not present

## 2021-07-30 DIAGNOSIS — Z1339 Encounter for screening examination for other mental health and behavioral disorders: Secondary | ICD-10-CM | POA: Diagnosis not present

## 2021-07-30 DIAGNOSIS — N951 Menopausal and female climacteric states: Secondary | ICD-10-CM | POA: Diagnosis not present

## 2021-08-13 DIAGNOSIS — K760 Fatty (change of) liver, not elsewhere classified: Secondary | ICD-10-CM | POA: Diagnosis not present

## 2021-08-13 DIAGNOSIS — Z6834 Body mass index (BMI) 34.0-34.9, adult: Secondary | ICD-10-CM | POA: Diagnosis not present

## 2021-08-20 DIAGNOSIS — E559 Vitamin D deficiency, unspecified: Secondary | ICD-10-CM | POA: Diagnosis not present

## 2021-08-20 DIAGNOSIS — Z6833 Body mass index (BMI) 33.0-33.9, adult: Secondary | ICD-10-CM | POA: Diagnosis not present

## 2021-08-27 DIAGNOSIS — E559 Vitamin D deficiency, unspecified: Secondary | ICD-10-CM | POA: Diagnosis not present

## 2021-08-27 DIAGNOSIS — Z6833 Body mass index (BMI) 33.0-33.9, adult: Secondary | ICD-10-CM | POA: Diagnosis not present

## 2021-08-28 DIAGNOSIS — F4321 Adjustment disorder with depressed mood: Secondary | ICD-10-CM | POA: Diagnosis not present

## 2021-08-31 ENCOUNTER — Encounter: Payer: Self-pay | Admitting: Physician Assistant

## 2021-08-31 ENCOUNTER — Ambulatory Visit (INDEPENDENT_AMBULATORY_CARE_PROVIDER_SITE_OTHER): Payer: BC Managed Care – PPO | Admitting: Physician Assistant

## 2021-08-31 ENCOUNTER — Encounter (HOSPITAL_BASED_OUTPATIENT_CLINIC_OR_DEPARTMENT_OTHER): Payer: Self-pay | Admitting: *Deleted

## 2021-08-31 ENCOUNTER — Emergency Department (HOSPITAL_BASED_OUTPATIENT_CLINIC_OR_DEPARTMENT_OTHER)
Admission: EM | Admit: 2021-08-31 | Discharge: 2021-08-31 | Disposition: A | Discharge status: Home / Self Care | Payer: BC Managed Care – PPO | Attending: Emergency Medicine | Admitting: Emergency Medicine

## 2021-08-31 ENCOUNTER — Other Ambulatory Visit: Payer: Self-pay

## 2021-08-31 VITALS — BP 129/86 | HR 84 | Temp 98.2°F | Resp 18 | Ht 66.5 in | Wt 206.0 lb

## 2021-08-31 DIAGNOSIS — R42 Dizziness and giddiness: Secondary | ICD-10-CM | POA: Diagnosis not present

## 2021-08-31 DIAGNOSIS — G44209 Tension-type headache, unspecified, not intractable: Secondary | ICD-10-CM | POA: Diagnosis not present

## 2021-08-31 DIAGNOSIS — Z5321 Procedure and treatment not carried out due to patient leaving prior to being seen by health care provider: Secondary | ICD-10-CM | POA: Diagnosis not present

## 2021-08-31 NOTE — Patient Instructions (Signed)
I do encourage you to increase your water intake, you should be drinking at least 64 to 80 ounces of water a day.  I do encourage you to work on decreasing your caloric intake rather than eliminating food groups.  We will call you with today's lab results.  Roney Jaffe, PA-C Physician Assistant [facility] [email or web address]   Dizziness Dizziness is a common problem. It is a feeling of unsteadiness or light-headedness. You may feel like you are about to faint. Dizziness can lead to injury if you stumble or fall. Anyone can become dizzy, but dizziness is more common in older adults. This condition can be caused by a number of things, including medicines, dehydration, or illness. Follow these instructions at home: Eating and drinking  Drink enough fluid to keep your urine pale yellow. This helps to keep you from becoming dehydrated. Try to drink more clear fluids, such as water. Do not drink alcohol. Limit your caffeine intake if told to do so by your health care provider. Check ingredients and nutrition facts to see if a food or beverage contains caffeine. Limit your salt (sodium) intake if told to do so by your health care provider. Check ingredients and nutrition facts to see if a food or beverage contains sodium. Activity  Avoid making quick movements. Rise slowly from chairs and steady yourself until you feel okay. In the morning, first sit up on the side of the bed. When you feel okay, stand slowly while you hold onto something until you know that your balance is good. If you need to stand in one place for a long time, move your legs often. Tighten and relax the muscles in your legs while you are standing. Do not drive or use machinery if you feel dizzy. Avoid bending down if you feel dizzy. Place items in your home so that they are easy for you to reach without leaning over. Lifestyle Do not use any products that contain  nicotine or tobacco. These products include cigarettes, chewing tobacco, and vaping devices, such as e-cigarettes. If you need help quitting, ask your health care provider. Try to reduce your stress level by using methods such as yoga or meditation. Talk with your health care provider if you need help to manage your stress. General instructions Watch your dizziness for any changes. Take over-the-counter and prescription medicines only as told by your health care provider. Talk with your health care provider if you think that your dizziness is caused by a medicine that you are taking. Tell a friend or a family member that you are feeling dizzy. If he or she notices any changes in your behavior, have this person call your health care provider. Keep all follow-up visits. This is important. Contact a health care provider if: Your dizziness does not go away or you have new symptoms. Your dizziness or light-headedness gets worse. You feel nauseous. You have reduced hearing. You have a fever. You have neck pain or a stiff neck. Your dizziness leads to an injury or a fall. Get help right away if: You vomit or have diarrhea and are unable to eat or drink anything. You have problems talking, walking, swallowing, or using your arms, hands, or legs. You feel generally weak. You have any bleeding. You are not thinking clearly or you have trouble forming sentences. It may take a friend or family member to notice this. You have chest pain, abdominal pain, shortness of breath, or sweating. Your vision changes or you develop a  severe headache. These symptoms may represent a serious problem that is an emergency. Do not wait to see if the symptoms will go away. Get medical help right away. Call your local emergency services (911 in the [place] Do not drive yourself to the hospital. Summary Dizziness is a feeling of unsteadiness or light-headedness. This condition can be caused by a number of things, including  medicines, dehydration, or illness. Anyone can become dizzy, but dizziness is more common in older adults. Drink enough fluid to keep your urine pale yellow. Do not drink alcohol. Avoid making quick movements if you feel dizzy. Monitor your dizziness for any changes. This information is not intended to replace advice given to you by your health care provider. Make sure you discuss any questions you have with your health care provider. Document Revised: 06/02/2020 Document Reviewed: 06/02/2020 Elsevier Patient Education  2022 [employer].

## 2021-08-31 NOTE — ED Notes (Signed)
Went to update vital signs, unable to locate patient x3

## 2021-08-31 NOTE — Progress Notes (Signed)
Established Patient Office Visit  Subjective:  Patient ID: Kelsey Carroll, female    DOB: Oct 21, 1995  Age: 26 y.o. MRN: [id number]  CC:  Chief Complaint  Patient presents with   Dizziness    HPI Kelsey Carroll reports that she started having a feeling of lightheadedness, feeling "drained", along with frontal headache on Saturday night.  States that she did use some over-the-counter pain reliever along with the shower with some relief of the headache.  States that she has been having difficulty at work, has to sit down several times due to her feeling of "drained".  Does endorse that she recently started a weight loss program, is drinking approximately 2 bottles of water a day, cut back on her coffee intake, and is eating a keto style diet with 1200 cal a day.  States that she receives a vitamin B12 and "fat burner" injection from a weight loss program center.  States that she received her first injection approximately 1 week ago.  States sleep is good, stress level is good   Past Medical History:  Diagnosis Date   Chronic back pain    Depression    Phreesia 02/11/2020   Obesity     History reviewed. No pertinent surgical history.  Family History  Problem Relation Age of Onset   Hypertension Mother 67   Anxiety disorder Mother    Hyperlipidemia Father 84   Stroke Maternal Grandmother    Anxiety disorder Sister     Social History   Socioeconomic History   Marital status: Single    Spouse name: Not on file   Number of children: Not on file   Years of education: Not on file   Highest education level: Not on file  Occupational History   Not on file  Tobacco Use   Smoking status: Never   Smokeless tobacco: Never  Vaping Use   Vaping Use: Never used  Substance and Sexual Activity   Alcohol use: Yes   Drug use: Yes    Types: Marijuana   Sexual activity: Yes    Partners: Male    Birth control/protection: Pill  Other Topics Concern   Not on file  Social  History Narrative   Lives with mother, sister, niece, nephew, and sister's boyfriend.       Diet:       Do you drink/ eat things with caffeine? Yes      Marital status:                               What year were you married ?       Do you live in a house, apartment,assistred living, condo, trailer, etc.)? House      Is it one or more stories? 1      How many persons live in your home ? 6      Do you have any pets in your home ?(please list) Yes, 1      Current or past profession:       Do you exercise?   No                           Type & how often:       Do you have a living will? No      Do you have a DNR form?  If not, do you want to discuss one?       Do you have signed POA?HPOA forms?                 If so, please bring to your        appointment         Social Determinants of Health   Financial Resource Strain: Not on file  Food Insecurity: Not on file  Transportation Needs: Not on file  Physical Activity: Not on file  Stress: Not on file  Social Connections: Not on file  Intimate Partner Violence: Not on file    Outpatient Medications Prior to Visit  Medication Sig Dispense Refill   clindamycin (CLEOCIN T) 1 % lotion clindamycin 1 % lotion  APP EXT AA BID     diazepam (VALIUM) 5 MG tablet Make take 1 tablet 30 minutes prior to flight 6 tablet 0   docusate sodium (COLACE) 100 MG capsule Take 1 capsule (100 mg total) by mouth 2 (two) times daily. 10 capsule 0   Levonorgestrel-Ethinyl Estradiol (SIMPESSE) 0.15-0.03 &0.01 MG tablet Take 1 tablet by mouth daily.     No facility-administered medications prior to visit.    No Known Allergies  ROS Review of Systems  Constitutional:  Negative for chills and fever.  HENT: Negative.    Eyes: Negative.   Respiratory:  Negative for shortness of breath.   Cardiovascular:  Negative for chest pain.  Gastrointestinal:  Negative for abdominal pain, nausea and vomiting.  Endocrine: Negative.    Genitourinary: Negative.   Musculoskeletal: Negative.   Skin: Negative.   Allergic/Immunologic: Negative.   Neurological:  Positive for weakness, light-headedness and headaches. Negative for syncope, facial asymmetry and speech difficulty.  Hematological: Negative.   Psychiatric/Behavioral:  Negative for dysphoric mood and sleep disturbance. The patient is not nervous/anxious.      Objective:    Physical Exam Vitals and nursing note reviewed.  Constitutional:      General: She is not in acute distress.    Appearance: Normal appearance. She is not ill-appearing.  HENT:     Head: Normocephalic and atraumatic.     Right Ear: External ear normal.     Left Ear: External ear normal.     Nose: Nose normal.     Mouth/Throat:     Mouth: Mucous membranes are moist.     Pharynx: Oropharynx is clear.  Eyes:     Extraocular Movements: Extraocular movements intact.     Conjunctiva/sclera: Conjunctivae normal.     Pupils: Pupils are equal, round, and reactive to light.  Cardiovascular:     Rate and Rhythm: Normal rate and regular rhythm.     Pulses: Normal pulses.     Heart sounds: Normal heart sounds.  Pulmonary:     Effort: Pulmonary effort is normal.     Breath sounds: Normal breath sounds.  Musculoskeletal:        General: Normal range of motion.     Cervical back: Normal range of motion and neck supple.  Skin:    General: Skin is warm and dry.  Neurological:     General: No focal deficit present.     Mental Status: She is alert and oriented to person, place, and time.     Cranial Nerves: No cranial nerve deficit.     Sensory: No sensory deficit.     Motor: No weakness.  Psychiatric:        Mood and Affect: Mood normal.  Behavior: Behavior normal.        Thought Content: Thought content normal.        Judgment: Judgment normal.    BP 129/86 (BP Location: Left Arm, Patient Position: Sitting, Cuff Size: Normal)    Pulse 84    Temp 98.2 F (36.8 C) (Oral)    Resp 18     Ht 5' 6.5" (1.689 m)    Wt 206 lb (93.4 kg)    LMP 08/06/2021    SpO2 100%    BMI 32.75 kg/m  Wt Readings from Last 3 Encounters:  08/31/21 206 lb (93.4 kg)  08/31/21 199 lb 15.3 oz (90.7 kg)  12/23/20 200 lb (90.7 kg)     Health Maintenance Due  Topic Date Due   COVID-19 Vaccine (1) Never done   HPV VACCINES (1 - 2-dose series) Never done   INFLUENZA VACCINE  Never done       Topic Date Due   HPV VACCINES (1 - 2-dose series) Never done    No results found for: TSH Lab Results  Component Value Date   WBC 4.5 02/14/2020   HGB 12.6 02/14/2020   HCT 39.3 02/14/2020   MCV 91 02/14/2020   PLT 297 02/14/2020   Lab Results  Component Value Date   NA 140 02/14/2020   K 4.0 02/14/2020   CO2 23 02/14/2020   GLUCOSE 80 02/14/2020   BUN 12 02/14/2020   CREATININE 0.90 02/14/2020   BILITOT 0.3 02/14/2020   ALKPHOS 45 (L) 02/14/2020   AST 14 02/14/2020   ALT 15 02/14/2020   PROT 7.4 02/14/2020   ALBUMIN 4.5 02/14/2020   CALCIUM 9.4 02/14/2020   ANIONGAP 7 05/05/2018   Lab Results  Component Value Date   CHOL 151 02/14/2020   Lab Results  Component Value Date   HDL 53 02/14/2020   Lab Results  Component Value Date   LDLCALC 89 02/14/2020   Lab Results  Component Value Date   TRIG 37 02/14/2020   Lab Results  Component Value Date   CHOLHDL 2.8 02/14/2020   No results found for: HGBA1C    Assessment & Plan:   Problem List Items Addressed This Visit   None Visit Diagnoses     Lightheadedness    -  Primary   Relevant Orders   CBC with Differential/Platelet   Comp. Metabolic Panel (12)   TSH   Acute non intractable tension-type headache           No orders of the defined types were placed in this encounter. 1. Lightheadedness More than likely attributed to dehydration, drastic change in diet.  Encourage patient to increase hydration, work on reducing caloric intake instead of eliminating food groups.  Red flags given for prompt reevaluation - CBC  with Differential/Platelet - Comp. Metabolic Panel (12) - TSH  2. Acute non intractable tension-type headache   I have reviewed the patient's medical history (PMH, PSH, Social History, Family History, Medications, and allergies) , and have been updated if relevant. I spent 30 minutes reviewing chart and  face to face time with patient.     Follow-up: Return if symptoms worsen or fail to improve.    Kasandra Knudsen Mayers, PA-C

## 2021-08-31 NOTE — ED Triage Notes (Signed)
Dizziness x 2 days. She just started a diet.

## 2021-09-01 ENCOUNTER — Encounter: Payer: Self-pay | Admitting: *Deleted

## 2021-09-01 LAB — CBC WITH DIFFERENTIAL/PLATELET
Basophils Absolute: 0.1 10*3/uL (ref 0.0–0.2)
Basos: 1 %
EOS (ABSOLUTE): 0.3 10*3/uL (ref 0.0–0.4)
Eos: 4 %
Hematocrit: 41.5 % (ref 34.0–46.6)
Hemoglobin: 13.8 g/dL (ref 11.1–15.9)
Immature Grans (Abs): 0 10*3/uL (ref 0.0–0.1)
Immature Granulocytes: 0 %
Lymphocytes Absolute: 3.6 10*3/uL — ABNORMAL HIGH (ref 0.7–3.1)
Lymphs: 38 %
MCH: 29.6 pg (ref 26.6–33.0)
MCHC: 33.3 g/dL (ref 31.5–35.7)
MCV: 89 fL (ref 79–97)
Monocytes Absolute: 0.8 10*3/uL (ref 0.1–0.9)
Monocytes: 8 %
Neutrophils Absolute: 4.8 10*3/uL (ref 1.4–7.0)
Neutrophils: 49 %
Platelets: 339 10*3/uL (ref 150–450)
RBC: 4.66 x10E6/uL (ref 3.77–5.28)
RDW: 12.7 % (ref 11.7–15.4)
WBC: 9.5 10*3/uL (ref 3.4–10.8)

## 2021-09-01 LAB — COMP. METABOLIC PANEL (12)
AST: 19 IU/L (ref 0–40)
Albumin/Globulin Ratio: 1.5 (ref 1.2–2.2)
Albumin: 4.6 g/dL (ref 3.9–5.0)
Alkaline Phosphatase: 42 IU/L — ABNORMAL LOW (ref 44–121)
BUN/Creatinine Ratio: 12 (ref 9–23)
BUN: 11 mg/dL (ref 6–20)
Bilirubin Total: 0.3 mg/dL (ref 0.0–1.2)
Calcium: 9.5 mg/dL (ref 8.7–10.2)
Chloride: 105 mmol/L (ref 96–106)
Creatinine, Ser: 0.95 mg/dL (ref 0.57–1.00)
Globulin, Total: 3.1 g/dL (ref 1.5–4.5)
Glucose: 74 mg/dL (ref 70–99)
Potassium: 4.3 mmol/L (ref 3.5–5.2)
Sodium: 139 mmol/L (ref 134–144)
Total Protein: 7.7 g/dL (ref 6.0–8.5)
eGFR: 85 mL/min/{1.73_m2} (ref >59)

## 2021-09-01 LAB — TSH: TSH: 1.03 u[IU]/mL (ref 0.450–4.500)

## 2021-09-03 ENCOUNTER — Telehealth: Payer: Self-pay | Admitting: *Deleted

## 2021-09-03 NOTE — Telephone Encounter (Signed)
-----   Message from Roney Jaffe, [place] sent at 09/01/2021  7:58 AM EST ----- Please call patient and let her know that her thyroid function, kidney and liver function are within normal limits.  She does not show signs of anemia.  She does have one liver enzyme that is slightly below normal limits, however not of concern.

## 2021-09-03 NOTE — Telephone Encounter (Signed)
Patient verified DOB Patient is aware of results via mychart and aware of liver enzyme being rechecked at her next visit, no concern at this time.

## 2021-09-10 DIAGNOSIS — M1990 Unspecified osteoarthritis, unspecified site: Secondary | ICD-10-CM | POA: Diagnosis not present

## 2021-09-10 DIAGNOSIS — Z789 Other specified health status: Secondary | ICD-10-CM | POA: Diagnosis not present

## 2021-09-10 DIAGNOSIS — Z6834 Body mass index (BMI) 34.0-34.9, adult: Secondary | ICD-10-CM | POA: Diagnosis not present

## 2021-09-11 DIAGNOSIS — F4321 Adjustment disorder with depressed mood: Secondary | ICD-10-CM | POA: Diagnosis not present

## 2021-09-17 DIAGNOSIS — N760 Acute vaginitis: Secondary | ICD-10-CM | POA: Diagnosis not present

## 2021-09-22 NOTE — Progress Notes (Signed)
Communication letter deleted. Patient answered the phone on the 3rd attempt.

## 2021-09-24 ENCOUNTER — Encounter: Payer: Self-pay | Admitting: Nurse Practitioner

## 2021-09-24 ENCOUNTER — Other Ambulatory Visit: Payer: Self-pay

## 2021-09-24 ENCOUNTER — Ambulatory Visit (INDEPENDENT_AMBULATORY_CARE_PROVIDER_SITE_OTHER): Payer: BC Managed Care – PPO | Admitting: Nurse Practitioner

## 2021-09-24 DIAGNOSIS — R197 Diarrhea, unspecified: Secondary | ICD-10-CM | POA: Diagnosis not present

## 2021-09-24 MED ORDER — LOPERAMIDE HCL 2 MG PO TABS: 2.0000 mg | ORAL_TABLET | Freq: Four times a day (QID) | ORAL | 0 refills | Status: DC | PRN

## 2021-09-24 NOTE — Progress Notes (Signed)
Virtual Visit via Telephone Note ? ?I connected with Kelsey Carroll on 09/24/21 at  1:20 PM EDT by telephone and verified that I am speaking with the correct person using two identifiers. ? ?Location: ?Patient: home ?Provider: office ?  ?I discussed the limitations, risks, security and privacy concerns of performing an evaluation and management service by telephone and the availability of in person appointments. I also discussed with the patient that there may be a patient responsible charge related to this service. The patient expressed understanding and agreed to proceed. ? ? ?History of Present Illness: ? ?Patient presents today with 1 day of diarrhea.  This is a telephone visit.  Patient states that she ate something yesterday she thinks maybe upset her stomach.  She denies any vomiting.  She denies any significant fever.  She states that symptoms are slowly resolving. Denies f/c/s, n/v, hemoptysis, PND, chest pain or edema. ? ? ?  ?Observations/Objective: ? ?Alert and oriented ? ? ?Assessment and Plan: ? ?Patient Instructions  ?1. Diarrhea, unspecified type ? ?- loperamide (IMODIUM A-D) 2 MG tablet; Take 1 tablet (2 mg total) by mouth 4 (four) times daily as needed for diarrhea or loose stools.  Dispense: 30 tablet; Refill: 0 ? ? ?Stay well hydrated ? ?Bland diet ? ?Follow up: ? ?Follow up if needed ? ? ?  ?I discussed the assessment and treatment plan with the patient. The patient was provided an opportunity to ask questions and all were answered. The patient agreed with the plan and demonstrated an understanding of the instructions. ?  ?The patient was advised to call back or seek an in-person evaluation if the symptoms worsen or if the condition fails to improve as anticipated. ? ?I provided 23 minutes of non-face-to-face time during this encounter. ? ? ?Fenton Foy, NP ? ?

## 2021-09-24 NOTE — Patient Instructions (Signed)
1. Diarrhea, unspecified type ? ?- loperamide (IMODIUM A-D) 2 MG tablet; Take 1 tablet (2 mg total) by mouth 4 (four) times daily as needed for diarrhea or loose stools.  Dispense: 30 tablet; Refill: 0 ? ? ?Stay well hydrated ? ?Bland diet ? ?Follow up: ? ?Follow up if needed ?

## 2021-10-01 DIAGNOSIS — F4321 Adjustment disorder with depressed mood: Secondary | ICD-10-CM | POA: Diagnosis not present

## 2021-10-09 ENCOUNTER — Other Ambulatory Visit: Payer: Self-pay

## 2021-10-09 ENCOUNTER — Ambulatory Visit: Payer: Self-pay | Admitting: *Deleted

## 2021-10-09 ENCOUNTER — Encounter (HOSPITAL_BASED_OUTPATIENT_CLINIC_OR_DEPARTMENT_OTHER): Payer: Self-pay | Admitting: Emergency Medicine

## 2021-10-09 ENCOUNTER — Emergency Department (HOSPITAL_BASED_OUTPATIENT_CLINIC_OR_DEPARTMENT_OTHER)
Admission: EM | Admit: 2021-10-09 | Discharge: 2021-10-09 | Disposition: A | Discharge status: Home / Self Care | Payer: BC Managed Care – PPO | Attending: Emergency Medicine | Admitting: Emergency Medicine

## 2021-10-09 DIAGNOSIS — L0231 Cutaneous abscess of buttock: Secondary | ICD-10-CM | POA: Insufficient documentation

## 2021-10-09 MED ORDER — IBUPROFEN 600 MG PO TABS: 600.0000 mg | ORAL_TABLET | Freq: Four times a day (QID) | ORAL | 0 refills | Status: DC | PRN

## 2021-10-09 MED ORDER — DOXYCYCLINE HYCLATE 100 MG PO CAPS: 100.0000 mg | ORAL_CAPSULE | Freq: Two times a day (BID) | ORAL | 0 refills | Status: AC

## 2021-10-09 NOTE — Telephone Encounter (Signed)
Second message left

## 2021-10-09 NOTE — ED Triage Notes (Signed)
Boil on left butt cheek since 3 days ago  denies fever ?

## 2021-10-09 NOTE — Discharge Instructions (Addendum)
Please read the attached information about abscesses.  Please take antibiotic for the entire course.  Warm sitz baths as we discussed.  Follow-up with your primary care provider ?

## 2021-10-09 NOTE — ED Provider Notes (Signed)
?[facility] DEPT ?Provider Note ? ? ?CSN: [id number] ?Arrival date & time: 10/09/21  1227 ? ?  ? ?History ? ?No chief complaint on file. ? ? ?Kelsey Carroll is a 26 y.o. female. ? ?HPI ? ?Patient is a 26 year old female with a past medical history significant for at bedtime with history of multiple abscesses in the past ? ?She is presented emergency room today with complaints of 3 days of left gluteal abscess.  She states it is not affecting her defecation she does not have any blood in her stool she has not had any fevers at home denies any chest pain difficulty breathing or any abdominal pain lightheadedness dizziness nausea vomiting diarrhea ? ?She states that she had a similar abscess approximately 1 month ago that went away on his own.  She states this 1 is somewhat more painful and irritated. ? ? ? ?  ? ?Home Medications ?Prior to Admission medications   ?Medication Sig Start Date End Date Taking? Authorizing Provider  ?doxycycline (VIBRAMYCIN) 100 MG capsule Take 1 capsule (100 mg total) by mouth 2 (two) times daily for 7 days. 10/09/21 10/16/21 Yes Issabela Lesko, Rodrigo Ran, PA  ?ibuprofen (ADVIL) 600 MG tablet Take 1 tablet (600 mg total) by mouth every 6 (six) hours as needed. 10/09/21  Yes Laymon Stockert, Stevphen Meuse S, PA  ?clindamycin (CLEOCIN T) 1 % lotion clindamycin 1 % lotion ? APP EXT AA BID    [provider]  ?diazepam (VALIUM) 5 MG tablet Make take 1 tablet 30 minutes prior to flight 07/29/21   Ivonne Andrew, NP  ?docusate sodium (COLACE) 100 MG capsule Take 1 capsule (100 mg total) by mouth 2 (two) times daily. 07/29/21   Ivonne Andrew, NP  ?Levonorgestrel-Ethinyl Estradiol (SIMPESSE) 0.15-0.03 &0.01 MG tablet Take 1 tablet by mouth daily. 04/16/20   [provider]  ?loperamide (IMODIUM A-D) 2 MG tablet Take 1 tablet (2 mg total) by mouth 4 (four) times daily as needed for diarrhea or loose stools. 09/24/21   Ivonne Andrew, NP  ?   ? ?Allergies    ?Patient has no known  allergies.   ? ?Review of Systems   ?Review of Systems ? ?Physical Exam ?Updated Vital Signs ?BP 106/80 (BP Location: Left Arm)   Pulse 83   Temp 99.7 ?F (37.6 ?C) (Oral)   Resp 18   Ht 5\' 6"  (1.676 m)   Wt 93.9 kg   SpO2 100%   BMI 33.41 kg/m?  ?Physical Exam ?Vitals and nursing note reviewed. Exam conducted with a chaperone present ).  ?Constitutional:   ?   General: She is not in acute distress. ?HENT:  ?   Head: Normocephalic and atraumatic.  ?   Nose: Nose normal.  ?Eyes:  ?   General: No scleral icterus. ?Cardiovascular:  ?   Rate and Rhythm: Normal rate and regular rhythm.  ?   Pulses: Normal pulses.  ?   Heart sounds: Normal heart sounds.  ?Pulmonary:  ?   Effort: Pulmonary effort is normal. No respiratory distress.  ?   Breath sounds: No wheezing.  ?Abdominal:  ?   Palpations: Abdomen is soft.  ?   Tenderness: There is no abdominal tenderness.  ?Genitourinary: ?   Comments: No perianal abscess ? ?Left-sided gluteal abscess measuring approximately 1 cm in diameter fluctuant no surrounding cellulitis or induration. ?Musculoskeletal:  ?   Cervical back: Normal range of motion.  ?   Right lower leg: No edema.  ?  Left lower leg: No edema.  ?Skin: ?   General: Skin is warm and dry.  ?   Capillary Refill: Capillary refill takes less than 2 seconds.  ?Neurological:  ?   Mental Status: She is alert. Mental status is at baseline.  ?Psychiatric:     ?   Mood and Affect: Mood normal.     ?   Behavior: Behavior normal.  ? ? ?ED Results / Procedures / Treatments   ?Labs ?(all labs ordered are listed, but only abnormal results are displayed) ?Labs Reviewed - No data to display ? ?EKG ?None ? ?Radiology ?No results found. ? ?Procedures ?Procedures  ? ? ?Medications Ordered in ED ?Medications - No data to display ? ?ED Course/ Medical Decision Making/ A&P ?Clinical Course as of 10/09/21 1312  ?Fri Oct 09, 2021  ?1246 3 days ago; 1 month ago resolved on its own  [WF]  ?  ?Clinical Course User Index ?[WF]  Gailen Shelter, [place]  ? ?                        ?Medical Decision Making ? ?Patient is a 26 year old female with a past medical history significant for at bedtime with history of multiple abscesses in the past ? ?She is presented emergency room today with complaints of 3 days of left gluteal abscess.  She states it is not affecting her defecation she does not have any blood in her stool she has not had any fevers at home denies any chest pain difficulty breathing or any abdominal pain lightheadedness dizziness nausea vomiting diarrhea ? ?She states that she had a similar abscess approximately 1 month ago that went away on his own.  She states this 1 is somewhat more painful and irritated. ? ? ?Patient with small 1 cm abscess that is nowhere near her anus.  It is on her left buttocks. ? ?Patient is nontoxic-appearing afebrile no tachycardia normal vital signs. ? ? ?I had a very lengthy discussion with patient about incision and drainage being the indicated treatment of this.  She states that she cannot have this done unless she has procedural sedation making her unconscious.  She understands that this is not something that could be done in the ER for this procedure.  I offered local lidocaine infiltration which she declines. ? ?She prefer to be treated with antibiotics and warm compresses.  I stressed to warm compresses and incision and drainage of the most helpful treatment.   ? ?We will follow-up with PCP.  Return precautions given. ? ?Final Clinical Impression(s) / ED Diagnoses ?Final diagnoses:  ?Abscess, gluteal, left  ? ? ?Rx / DC Orders ?ED Discharge Orders   ? ?      Ordered  ?  doxycycline (VIBRAMYCIN) 100 MG capsule  2 times daily       ? 10/09/21 1307  ?  ibuprofen (ADVIL) 600 MG tablet  Every 6 hours PRN       ? 10/09/21 1308  ? ?  ?  ? ?  ? ? ?  ?Gailen Shelter, [place] ?10/09/21 1314 ? ?  ?Terald Sleeper, MD ?10/09/21 1805 ? ?

## 2021-10-09 NOTE — ED Notes (Signed)
At beside with PA for exam as a chaperone.  ?

## 2021-10-09 NOTE — Telephone Encounter (Signed)
Summary: boil in between her buttcheeks  ? Pt stated she has a boil in between her buttcheeks. Pt is requesting antibiotics and stated this started two days ago. Pt mentioned that this is not the first time she has experienced this she had a boil in the same place a while back. Pt mentioned its painful but not severe yet just irritated.   ?  ?Voicemail message left. ?

## 2021-10-09 NOTE — Telephone Encounter (Signed)
?  Chief Complaint: boil-Left buttock ?Symptoms: pain, swelling ?Frequency: 2 days ?Pertinent Negatives: Patient denies fever ?Disposition: [] ED /[] Urgent Care (no appt availability in office) / [] Appointment(In office/virtual)/ []  [place] [facility]/ [] Home Care/ [x] Refused Recommended Disposition /[] [place] [facility]/ []  Follow-up with PCP ?Additional Notes: Call to office-  no open appointment- patient advised UC- declines and request Monday appointment- patient scheduled as requested- advised if her symptoms get worse- please be seen at [facility] ? ?Reason for Disposition ? SEVERE pain (e.g., excruciating) ? ?Answer Assessment - Initial Assessment Questions ?1. APPEARANCE of BOIL: "What does the boil look like?"  ?    swollen ?2. LOCATION: "Where is the boil located?"  ?    Buttock- at rectum- left ?3. NUMBER: "How many boils are there?"  ?    1 ?4. SIZE: "How big is the boil?" (e.g., inches, cm; compare to size of a coin or other object) ?    Quarter size ?5. ONSET: "When did the boil start?" ?    2 days ?6. PAIN: "Is there any pain?" If Yes, ask: "How bad is the pain?"   (Scale 1-10; or mild, moderate, severe) ?    Yes- severe ?7. FEVER: "Do you have a fever?" If Yes, ask: "What is it, how was it measured, and when did it start?"  ?    no ?8. SOURCE: "Have you been around anyone with boils or other Staph infections?" "Have you ever had boils before?" ?    Reoccurring -same area opened and drained 1 month ago ?9. OTHER SYMPTOMS: "Do you have any other symptoms?" (e.g., shaking chills, weakness, rash elsewhere on body) ?    no ?10. PREGNANCY: "Is there any chance you are pregnant?" "When was your last menstrual period?" ?      No- LMP- OCP ? ?Protocols used: Boil (Skin Abscess)-A-AH ? ?

## 2021-10-12 ENCOUNTER — Ambulatory Visit: Payer: BC Managed Care – PPO | Admitting: Physician Assistant

## 2021-11-18 ENCOUNTER — Encounter: Payer: Self-pay | Admitting: Gastroenterology

## 2021-11-19 DIAGNOSIS — Z113 Encounter for screening for infections with a predominantly sexual mode of transmission: Secondary | ICD-10-CM | POA: Diagnosis not present

## 2021-11-19 DIAGNOSIS — F4321 Adjustment disorder with depressed mood: Secondary | ICD-10-CM | POA: Diagnosis not present

## 2021-11-19 DIAGNOSIS — Z309 Encounter for contraceptive management, unspecified: Secondary | ICD-10-CM | POA: Diagnosis not present

## 2021-11-19 DIAGNOSIS — N760 Acute vaginitis: Secondary | ICD-10-CM | POA: Diagnosis not present

## 2021-12-10 DIAGNOSIS — Z79899 Other long term (current) drug therapy: Secondary | ICD-10-CM | POA: Diagnosis not present

## 2021-12-10 DIAGNOSIS — A499 Bacterial infection, unspecified: Secondary | ICD-10-CM | POA: Insufficient documentation

## 2021-12-10 DIAGNOSIS — L732 Hidradenitis suppurativa: Secondary | ICD-10-CM | POA: Diagnosis not present

## 2021-12-14 ENCOUNTER — Ambulatory Visit: Payer: BC Managed Care – PPO | Admitting: Gastroenterology

## 2021-12-24 DIAGNOSIS — N760 Acute vaginitis: Secondary | ICD-10-CM | POA: Diagnosis not present

## 2021-12-24 DIAGNOSIS — R895 Abnormal microbiological findings in specimens from other organs, systems and tissues: Secondary | ICD-10-CM | POA: Diagnosis not present

## 2022-01-19 ENCOUNTER — Encounter: Payer: Self-pay | Admitting: Gastroenterology

## 2022-01-19 ENCOUNTER — Ambulatory Visit (INDEPENDENT_AMBULATORY_CARE_PROVIDER_SITE_OTHER): Payer: BC Managed Care – PPO | Admitting: Gastroenterology

## 2022-01-19 VITALS — BP 110/72 | HR 78 | Ht 66.0 in | Wt 217.8 lb

## 2022-01-19 DIAGNOSIS — K5909 Other constipation: Secondary | ICD-10-CM

## 2022-01-19 DIAGNOSIS — R1033 Periumbilical pain: Secondary | ICD-10-CM

## 2022-01-19 MED ORDER — LINACLOTIDE 145 MCG PO CAPS: 145.0000 ug | ORAL_CAPSULE | Freq: Every day | ORAL | 0 refills | Status: DC

## 2022-01-19 MED ORDER — LINACLOTIDE 290 MCG PO CAPS: 290.0000 ug | ORAL_CAPSULE | Freq: Every day | ORAL | 0 refills | Status: DC

## 2022-01-19 NOTE — Progress Notes (Signed)
[facility] Consult Note:  History: Kelsey Carroll 01/19/2022  Referring provider: Lavinia Sharps, NP  Reason for consult/chief complaint: New Patient (Initial Visit), Abdominal Pain (Mid abd pain after meals (chronic)), and Constipation (Irregular bowel movements (chronic) )   Subjective  HPI:  Kelsey Carroll is a very pleasant 26 year old referred by primary care for chronic constipation and abdominal pain.  She has had years of constipation with a BM every 3 to 4 days, in between which she does not have a sensation of a need for bowel movement.  She denies rectal pain or bleeding.  She does have a periumbilical dull intermittent discomfort with bloating, more so after meals. She denies dysphagia, odynophagia, nausea, vomiting or early satiety.  Weight has been increasing over the last year.  She has hidradenitis for which she is on Humira about the last year, managed by dermatology.   ROS:  Review of Systems  Constitutional:  Negative for appetite change and unexpected weight change.  HENT:  Negative for mouth sores and voice change.   Eyes:  Negative for pain and redness.  Respiratory:  Negative for cough and shortness of breath.   Cardiovascular:  Negative for chest pain and palpitations.  Genitourinary:  Negative for dysuria and hematuria.  Musculoskeletal:  Negative for arthralgias and myalgias.  Skin:  Negative for pallor and rash.       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  Hematological:  Negative for adenopathy.     Past Medical History: Past Medical History:  Diagnosis Date   Chronic back pain    Depression    Phreesia 02/11/2020   Obesity      Past Surgical History: History reviewed. No pertinent surgical history.   Family History: Family History  Problem Relation Age of Onset   Hypertension Mother 55   Anxiety disorder Mother    Hyperlipidemia Father 24   Stroke Maternal  Grandmother    Anxiety disorder Sister     Social History: Social History   Socioeconomic History   Marital status: Single    Spouse name: Not on file   Number of children: Not on file   Years of education: Not on file   Highest education level: Not on file  Occupational History   Not on file  Tobacco Use   Smoking status: Never   Smokeless tobacco: Never  Vaping Use   Vaping Use: Never used  Substance and Sexual Activity   Alcohol use: Yes    Comment: Occassional alcohol use   Drug use: Not Currently    Types: Marijuana   Sexual activity: Yes    Partners: Male    Birth control/protection: Pill  Other Topics Concern   Not on file  Social History Narrative   Lives with mother, sister, niece, nephew, and sister's boyfriend.       Diet:       Do you drink/ eat things with caffeine? Yes      Marital status:                               What year were you married ?       Do you live in a house, apartment,assistred living, condo, trailer, etc.)? House      Is it one or more stories? 1      How many persons live in your home ? 6  Do you have any pets in your home ?(please list) Yes, 1      Current or past profession:       Do you exercise?   No                           Type & how often:       Do you have a living will? No      Do you have a DNR form?                       If not, do you want to discuss one?       Do you have signed POA?HPOA forms?                 If so, please bring to your        appointment         Social Determinants of Health   Financial Resource Strain: Not on file  Food Insecurity: Not on file  Transportation Needs: Not on file  Physical Activity: Not on file  Stress: Not on file  Social Connections: Not on file    Allergies: No Known Allergies  Outpatient Meds: Current Outpatient Medications  Medication Sig Dispense Refill   HUMIRA PEN 80 MG/0.8ML PNKT Inject into the skin.     Levonorgestrel-Ethinyl Estradiol  (SEASONIQUE) 0.15-0.03 &0.01 MG tablet Seasonique 0.15 mg-30 mcg (84)/10 mcg(7) tablets,3 month dose pack  Take 1 tablet every day by oral route.     Levonorgestrel-Ethinyl Estradiol (SIMPESSE) 0.15-0.03 &0.01 MG tablet Take 1 tablet by mouth daily.     linaclotide (LINZESS) 145 MCG CAPS capsule Take 1 capsule (145 mcg total) by mouth daily before breakfast. 8 capsule 0   linaclotide (LINZESS) 290 MCG CAPS capsule Take 1 capsule (290 mcg total) by mouth daily before breakfast. 8 capsule 0   azithromycin (ZITHROMAX) 500 MG tablet azithromycin 500 mg tablet  TAKE 2 TABLETS ON THE FIRST DAY THEN TAKE 1 DAILY FOR NEXT 3 DAYS (Patient not taking: Reported on 01/19/2022)     clindamycin (CLEOCIN T) 1 % lotion clindamycin 1 % lotion  APP EXT AA BID (Patient not taking: Reported on 01/19/2022)     diazepam (VALIUM) 5 MG tablet Make take 1 tablet 30 minutes prior to flight (Patient not taking: Reported on 01/19/2022) 6 tablet 0   docusate sodium (COLACE) 100 MG capsule Take 1 capsule (100 mg total) by mouth 2 (two) times daily. (Patient not taking: Reported on 01/19/2022) 10 capsule 0   hydroquinone 4 % cream hydroquinone 4 % topical cream  APPLY ONE APPLICATION ON THE SKIN TWICE DAILY TO DARK SPOTS. (Patient not taking: Reported on 01/19/2022)     ibuprofen (ADVIL) 600 MG tablet Take 1 tablet (600 mg total) by mouth every 6 (six) hours as needed. (Patient not taking: Reported on 01/19/2022) 30 tablet 0   loperamide (IMODIUM A-D) 2 MG tablet Take 1 tablet (2 mg total) by mouth 4 (four) times daily as needed for diarrhea or loose stools. (Patient not taking: Reported on 01/19/2022) 30 tablet 0   nystatin-triamcinolone (MYCOLOG II) cream nystatin-triamcinolone 100,000 unit/g-0.1 % topical cream  APPLY TOPICALLY TO THE AFFECTED AREA TWICE DAILY IN THE MORNING AND IN THE EVENING (Patient not taking: Reported on 01/19/2022)     tretinoin (RETIN-A) 0.025 % cream tretinoin 0.025 % topical cream  APPLY TOPICALLY TO THE  AFFECTED AREA EVERY NIGHT (  Patient not taking: Reported on 01/19/2022)     No current facility-administered medications for this visit.      ___________________________________________________________________ Objective   Exam:  BP 110/72   Pulse 78   Ht 5\' 6"  (1.676 m)   Wt 217 lb 12.8 oz (98.8 kg)   SpO2 98%   BMI 35.15 kg/m  Wt Readings from Last 3 Encounters:  01/19/22 217 lb 12.8 oz (98.8 kg)  10/09/21 207 lb (93.9 kg)  08/31/21 199 lb 15.3 oz (90.7 kg)    General: Well-appearing Eyes: sclera anicteric, no redness ENT: oral mucosa moist without lesions, no cervical or supraclavicular lymphadenopathy CV: Regular without murmur, no JVD, no peripheral edema Resp: clear to auscultation bilaterally, normal RR and effort noted GI: soft, no tenderness, with active bowel sounds. No guarding or palpable organomegaly noted. Skin: Multiple tattoos.  She reports an active skin lesion to her left axilla, currently covered with a bandage Neuro: awake, alert and oriented x 3. Normal gross motor function and fluent speech  Labs:     Latest Ref Rng & Units 08/31/2021    3:42 PM 02/14/2020    9:59 AM 05/05/2018    8:19 AM  CBC  WBC 3.4 - 10.8 x10E3/uL 9.5  4.5  4.6   Hemoglobin 11.1 - 15.9 g/dL 05/07/2018  53.6  14.4   Hematocrit 34.0 - 46.6 % 41.5  39.3  39.4   Platelets 150 - 450 x10E3/uL 339  297  337       Latest Ref Rng & Units 08/31/2021    3:42 PM 02/14/2020    9:59 AM 05/05/2018    8:19 AM  CMP  Glucose 70 - 99 mg/dL 74  80  95   BUN 6 - 20 mg/dL 11  12  7    Creatinine 0.57 - 1.00 mg/dL 05/07/2018   4.00   Sodium 134 - 144 mmol/L 139  140  139   Potassium 3.5 - 5.2 mmol/L 4.3  4.0  4.1   Chloride 96 - 106 mmol/L 105  105  109   CO2 20 - 29 mmol/L  23  23   Calcium 8.7 - 10.2 mg/dL 9.5  9.4  9.4   Total Protein 6.0 - 8.5 g/dL 7.7  7.4  7.2   Total Bilirubin 0.0 - 1.2 mg/dL 0.3  0.3  0.9   Alkaline Phos 44 - 121 IU/L 42  45  38   AST 0 - 40 IU/L 19  14  23    ALT 0 - 32 IU/L   15  17    Normal TSH on 08/31/2021   Assessment: Encounter Diagnoses  Name Primary?   Chronic constipation Yes   Periumbilical abdominal pain     Chronic constipation that sounds like a motility problem.  Not typical story for pelvic floor dysfunction/dyssynergy defecation.  Unlikely to be obstructive with this clinical history and her age.  Not anemic or or hypothyroid.  Colonoscopy therefore likely low yield.  Plan:  She is trying to get sufficient dietary fiber, says she gets plenty of water intake and has been going to the gym for increased exercise and she hopes for weight loss with that.  I recommend a trial of some prescription medicine to see if we can relieve the constipation and the resultant abdominal pain.  She has been prescribed stool softener by primary care when I tried it, and I think that is unlikely to significantly improve a bowel pattern of every 3 to 4  days.  We gave her samples of Linzess 145 mcg and 290 micrograms to start at lower dose and then try the higher if needed after a week.  If not much improvement we may move to Motegrity.  Return to office about 6 weeks, call sooner if needed.   Charlie Pitter III  CC: Referring provider noted above

## 2022-01-19 NOTE — Patient Instructions (Addendum)
If you are age 26 or older, your body mass index should be between 23-30. Your Body mass index is 35.15 kg/m. If this is out of the aforementioned range listed, please consider follow up with your Primary Care Provider.  If you are age 43 or younger, your body mass index should be between 19-25. Your Body mass index is 35.15 kg/m. If this is out of the aformentioned range listed, please consider follow up with your Primary Care Provider.   ________________________________________________________  The [facility] providers would like to encourage you to use [facility] to communicate with providers for non-urgent requests or questions.  Due to long hold times on the telephone, sending your provider a message by [facility] may be a faster and more efficient way to get a response.  Please allow 48 business hours for a response.  Please remember that this is for non-urgent requests.  _______________________________________________________  Medication Samples have been provided to the patient.  Drug name: Linzess 158mcg/ Linzess        Qty: 8 of each  :     Dosing instructions: Start with one of the Linzess a day. If no improvement increase to the Linzess .   The patient has been instructed regarding the correct time, dose, and frequency of taking this medication, including desired effects and most common side effects.   It was a pleasure to see you today!  Thank you for trusting me with your gastrointestinal care!

## 2022-02-16 ENCOUNTER — Encounter: Payer: Self-pay | Admitting: Physician Assistant

## 2022-02-16 ENCOUNTER — Ambulatory Visit: Payer: BC Managed Care – PPO | Admitting: Physician Assistant

## 2022-02-16 ENCOUNTER — Ambulatory Visit (INDEPENDENT_AMBULATORY_CARE_PROVIDER_SITE_OTHER): Payer: BC Managed Care – PPO | Admitting: Physician Assistant

## 2022-02-16 ENCOUNTER — Ambulatory Visit: Payer: Self-pay

## 2022-02-16 VITALS — BP 105/73 | HR 70 | Resp 18 | Ht 66.0 in | Wt 211.0 lb

## 2022-02-16 DIAGNOSIS — K5909 Other constipation: Secondary | ICD-10-CM | POA: Diagnosis not present

## 2022-02-16 DIAGNOSIS — R1032 Left lower quadrant pain: Secondary | ICD-10-CM | POA: Diagnosis not present

## 2022-02-16 NOTE — Patient Instructions (Signed)
I encourage you to do the trial of Linzess as prescribed by gastroenterology.  I encourage you to keep a written log of your bowel movements as well.  I encourage you to increase your water intake, you should be drinking at least 64 ounces of water a day.  Please let us know if there is anything else we can do for you  Roney Jaffe, PA-C Physician Assistant [facility] [email or web address]   Abdominal Pain, Adult Pain in the abdomen (abdominal pain) can be caused by many things. Often, abdominal pain is not serious and it gets better with no treatment or by being treated at home. However, sometimes abdominal pain is serious. Your health care provider will ask questions about your medical history and do a physical exam to try to determine the cause of your abdominal pain. Follow these instructions at home: Medicines Take over-the-counter and prescription medicines only as told by your health care provider. Do not take a laxative unless told by your health care provider. General instructions  Watch your condition for any changes. Drink enough fluid to keep your urine pale yellow. Keep all follow-up visits as told by your health care provider. This is important. Contact a health care provider if: Your abdominal pain changes or gets worse. You are not hungry or you lose weight without trying. You are constipated or have diarrhea for more than 2-3 days. You have pain when you urinate or have a bowel movement. Your abdominal pain wakes you up at night. Your pain gets worse with meals, after eating, or with certain foods. You are vomiting and cannot keep anything down. You have a fever. You have blood in your urine. Get help right away if: Your pain does not go away as soon as your health care provider told you to expect. You cannot stop vomiting. Your pain is only in areas of the abdomen, such as the right side or the left lower  portion of the abdomen. Pain on the right side could be caused by appendicitis. You have bloody or black stools, or stools that look like tar. You have severe pain, cramping, or bloating in your abdomen. You have signs of dehydration, such as: Dark urine, very little urine, or no urine. Cracked lips. Dry mouth. Sunken eyes. Sleepiness. Weakness. You have trouble breathing or chest pain. Summary Often, abdominal pain is not serious and it gets better with no treatment or by being treated at home. However, sometimes abdominal pain is serious. Watch your condition for any changes. Take over-the-counter and prescription medicines only as told by your health care provider. Contact a health care provider if your abdominal pain changes or gets worse. Get help right away if you have severe pain, cramping, or bloating in your abdomen. This information is not intended to replace advice given to you by your health care provider. Make sure you discuss any questions you have with your health care provider. Document Revised: 08/17/2019 Document Reviewed: 11/06/2018 Elsevier Patient Education  2023 [employer].

## 2022-02-16 NOTE — Progress Notes (Unsigned)
Established Patient Office Visit  Subjective   Patient ID: Kelsey Carroll, female    DOB: Nov 24, 1995  Age: 26 y.o. MRN: [id number]  No chief complaint on file.   Chronic constipation that sounds like a motility problem.  Not typical story for pelvic floor dysfunction/dyssynergy defecation.  Unlikely to be obstructive with this clinical history and her age.  Not anemic or or hypothyroid.  Colonoscopy therefore likely low yield.   Plan:   She is trying to get sufficient dietary fiber, says she gets plenty of water intake and has been going to the gym for increased exercise and she hopes for weight loss with that.   I recommend a trial of some prescription medicine to see if we can relieve the constipation and the resultant abdominal pain.  She has been prescribed stool softener by primary care when I tried it, and I think that is unlikely to significantly improve a bowel pattern of every 3 to 4 days.   We gave her samples of Linzess 145 mcg and 290 micrograms to start at lower dose and then try the higher if needed after a week.  If not much improvement we may move to Motegrity.   Return to office about 6 weeks, call sooner if needed.     Charlie Pitter III   CC: Referring provider noted above  States that she took the linzess,  Last BM 2 days ago, in between 3 days before that  2-3 bottles        Past Medical History:  Diagnosis Date   Chronic back pain    Depression    Phreesia 02/11/2020   Obesity    Social History   Socioeconomic History   Marital status: Single    Spouse name: Not on file   Number of children: Not on file   Years of education: Not on file   Highest education level: Not on file  Occupational History   Not on file  Tobacco Use   Smoking status: Never   Smokeless tobacco: Never  Vaping Use   Vaping Use: Never used  Substance and Sexual Activity   Alcohol use: Yes    Comment: Occassional alcohol use   Drug use: Not Currently    Types:  Marijuana   Sexual activity: Yes    Partners: Male    Birth control/protection: Pill  Other Topics Concern   Not on file  Social History Narrative   Lives with mother, sister, niece, nephew, and sister's boyfriend.       Diet:       Do you drink/ eat things with caffeine? Yes      Marital status:                               What year were you married ?       Do you live in a house, apartment,assistred living, condo, trailer, etc.)? House      Is it one or more stories? 1      How many persons live in your home ? 6      Do you have any pets in your home ?(please list) Yes, 1      Current or past profession:       Do you exercise?   No  Type & how often:       Do you have a living will? No      Do you have a DNR form?                       If not, do you want to discuss one?       Do you have signed POA?HPOA forms?                 If so, please bring to your        appointment         Social Determinants of Health   Financial Resource Strain: Not on file  Food Insecurity: Not on file  Transportation Needs: Not on file  Physical Activity: Not on file  Stress: Not on file  Social Connections: Not on file  Intimate Partner Violence: Not on file   Family History  Problem Relation Age of Onset   Hypertension Mother 64   Anxiety disorder Mother    Hyperlipidemia Father 60   Stroke Maternal Grandmother    Anxiety disorder Sister    No Known Allergies  ROS    Objective:     There were no vitals taken for this visit.   Physical Exam     Assessment & Plan:   Problem List Items Addressed This Visit   None   No follow-ups on file.    Kasandra Knudsen Mayers, PA-C

## 2022-02-16 NOTE — Telephone Encounter (Signed)
  Chief Complaint: Abdominal  - resolved Symptoms: Abdominal  Frequency: 7 am today Pertinent Negatives: Patient denies Current pain Disposition: [] ED /[] Urgent Care (no appt availability in office) / [x] Appointment(In office/virtual)/ []  [place] Virtual Care/ [] Home Care/ [] Refused Recommended Disposition /[]  Mobile Bus/ []  Follow-up with PCP Additional Notes: Pt woke up with fairly sudden abdominal pain, mostly on the left side, next to and above the navel. Pt also had some referred pain to left should and upper back. Both of these have now resolved. PT has similar events at the start of her period. PT was also a bit nauseous. PT passed gas without relief. Pt took a nap and most of the pain has resolved.   Reason for Disposition  [1] MILD-MODERATE pain AND [2] constant AND [3] present > 2 hours  Answer Assessment - Initial Assessment Questions 1. LOCATION: "Where does it hurt?"      Left side above belly button 2. RADIATION: "Does the pain shoot anywhere else?" (e.g., chest, back)     Went to left shoulder - back 3. ONSET: "When did the pain begin?" (e.g., minutes, hours or days ago)      7AM 4. SUDDEN: "Gradual or sudden onset?"     sudden 5. PATTERN "Does the pain come and go, or is it constant?"    - If it comes and goes: "How long does it last?" "Do you have pain now?"     (Note: Comes and goes means the pain is intermittent. It goes away completely between bouts.)    - If constant: "Is it getting better, staying the same, or getting worse?"      (Note: Constant means the pain never goes away completely; most serious pain is constant and gets worse.)      Constant - now gone 6. SEVERITY: "How bad is the pain?"  (e.g., Scale 1-10; mild, moderate, or severe)    - MILD (1-3): Doesn't interfere with normal activities, abdomen soft and not tender to touch.     - MODERATE (4-7): Interferes with normal activities or awakens from sleep, abdomen tender to touch.     - SEVERE  (8-10): Excruciating pain, doubled over, unable to do any normal activities.       0/10 7. RECURRENT SYMPTOM: "Have you ever had this type of stomach pain before?" If Yes, ask: "When was the last time?" and "What happened that time?"      Sometimes with periods 8. CAUSE: "What do you think is causing the stomach pain?"     Unsure 9. RELIEVING/AGGRAVATING FACTORS: "What makes it better or worse?" (e.g., antacids, bending or twisting motion, bowel movement)     Laid down and slept a bit  10. OTHER SYMPTOMS: "Do you have any other symptoms?" (e.g., back pain, diarrhea, fever, urination pain, vomiting)       Was a little nauseous 11. PREGNANCY: "Is there any chance you are pregnant?" "When was your last menstrual period?"       no  Protocols used: Abdominal Pain - [facility]

## 2022-02-17 ENCOUNTER — Encounter: Payer: Self-pay | Admitting: Physician Assistant

## 2022-03-08 ENCOUNTER — Ambulatory Visit: Payer: BC Managed Care – PPO | Admitting: Gastroenterology

## 2022-03-08 NOTE — Progress Notes (Deleted)
[facility] Progress Note  Chief Complaint: Chronic constipation  Subjective  History: Aaliayah was seen in office consultation about 6 weeks ago for lower abdominal pain and chronic constipation that sounded most likely a motility disorder.  She was making efforts at increasing dietary fiber, and I gave her a trial of Linzess.  According to her primary care note from August 8, she only took 2 or 3 doses.  ***  ROS: Cardiovascular:  no chest pain Respiratory: no dyspnea  The patient's Past Medical, Family and Social History were reviewed and are on file in the EMR.  Objective:  Med list reviewed  Current Outpatient Medications:    azithromycin (ZITHROMAX) 500 MG tablet, azithromycin 500 mg tablet  TAKE 2 TABLETS ON THE FIRST DAY THEN TAKE 1 DAILY FOR NEXT 3 DAYS (Patient not taking: Reported on 01/19/2022), Disp: , Rfl:    clindamycin (CLEOCIN T) 1 % lotion, clindamycin 1 % lotion  APP EXT AA BID (Patient not taking: Reported on 01/19/2022), Disp: , Rfl:    diazepam (VALIUM) 5 MG tablet, Make take 1 tablet 30 minutes prior to flight (Patient not taking: Reported on 01/19/2022), Disp: 6 tablet, Rfl: 0   docusate sodium (COLACE) 100 MG capsule, Take 1 capsule (100 mg total) by mouth 2 (two) times daily. (Patient not taking: Reported on 01/19/2022), Disp: 10 capsule, Rfl: 0   HUMIRA PEN 80 MG/0.8ML PNKT, Inject into the skin., Disp: , Rfl:    hydroquinone 4 % cream, hydroquinone 4 % topical cream  APPLY ONE APPLICATION ON THE SKIN TWICE DAILY TO DARK SPOTS. (Patient not taking: Reported on 01/19/2022), Disp: , Rfl:    ibuprofen (ADVIL) 600 MG tablet, Take 1 tablet (600 mg total) by mouth every 6 (six) hours as needed. (Patient not taking: Reported on 01/19/2022), Disp: 30 tablet, Rfl: 0   Levonorgestrel-Ethinyl Estradiol (SEASONIQUE) 0.15-0.03 &0.01 MG tablet, Seasonique 0.15 mg-30 mcg (84)/10 mcg(7) tablets,3 month dose pack  Take 1 tablet every day by oral route., Disp: , Rfl:     Levonorgestrel-Ethinyl Estradiol (SIMPESSE) 0.15-0.03 &0.01 MG tablet, Take 1 tablet by mouth daily., Disp: , Rfl:    linaclotide (LINZESS) 145 MCG CAPS capsule, Take 1 capsule (145 mcg total) by mouth daily before breakfast. (Patient not taking: Reported on 02/16/2022), Disp: 8 capsule, Rfl: 0   linaclotide (LINZESS) 290 MCG CAPS capsule, Take 1 capsule (290 mcg total) by mouth daily before breakfast. (Patient not taking: Reported on 02/16/2022), Disp: 8 capsule, Rfl: 0   loperamide (IMODIUM A-D) 2 MG tablet, Take 1 tablet (2 mg total) by mouth 4 (four) times daily as needed for diarrhea or loose stools. (Patient not taking: Reported on 01/19/2022), Disp: 30 tablet, Rfl: 0   nystatin-triamcinolone (MYCOLOG II) cream, nystatin-triamcinolone 100,000 unit/g-0.1 % topical cream  APPLY TOPICALLY TO THE AFFECTED AREA TWICE DAILY IN THE MORNING AND IN THE EVENING (Patient not taking: Reported on 01/19/2022), Disp: , Rfl:    tretinoin (RETIN-A) 0.025 % cream, tretinoin 0.025 % topical cream  APPLY TOPICALLY TO THE AFFECTED AREA EVERY NIGHT (Patient not taking: Reported on 01/19/2022), Disp: , Rfl:    Vital signs in last 24 hrs: There were no vitals filed for this visit. Wt Readings from Last 3 Encounters:  02/16/22 211 lb (95.7 kg)  01/19/22 217 lb 12.8 oz (98.8 kg)  10/09/21 207 lb (93.9 kg)    Physical Exam  *** HEENT: sclera anicteric, oral mucosa moist without lesions Neck: supple, no thyromegaly, JVD or lymphadenopathy  Cardiac: ***,  no peripheral edema Pulm: clear to auscultation bilaterally, normal RR and effort noted Abdomen: soft, *** tenderness, with active bowel sounds. No guarding or palpable hepatosplenomegaly. Skin; warm and dry, no jaundice or rash  Labs:   ___________________________________________ Radiologic studies:   ____________________________________________ Other:   _____________________________________________ Assessment & Plan  Assessment: No diagnosis  found.    Plan:   *** minutes were spent on this encounter (including chart review, history/exam, counseling/coordination of care, and documentation) > 50% of that time was spent on counseling and coordination of care.   Charlie Pitter III

## 2022-03-25 ENCOUNTER — Telehealth: Payer: BC Managed Care – PPO | Admitting: Physician Assistant

## 2022-03-25 DIAGNOSIS — N946 Dysmenorrhea, unspecified: Secondary | ICD-10-CM

## 2022-03-25 NOTE — Progress Notes (Signed)
Virtual Visit Consent   Kelsey Carroll, you are scheduled for a virtual visit with a [facility] provider today. Just as with appointments in the office, your consent must be obtained to participate. Your consent will be active for this visit and any virtual visit you may have with one of our providers in the next 365 days. If you have a MyChart account, a copy of this consent can be sent to you electronically.  As this is a virtual visit, video technology does not allow for your provider to perform a traditional examination. This may limit your provider's ability to fully assess your condition. If your provider identifies any concerns that need to be evaluated in person or the need to arrange testing (such as labs, EKG, etc.), we will make arrangements to do so. Although advances in technology are sophisticated, we cannot ensure that it will always work on either your end or our end. If the connection with a video visit is poor, the visit may have to be switched to a telephone visit. With either a video or telephone visit, we are not always able to ensure that we have a secure connection.  By engaging in this virtual visit, you consent to the provision of healthcare and authorize for your insurance to be billed (if applicable) for the services provided during this visit. Depending on your insurance coverage, you may receive a charge related to this service.  I need to obtain your verbal consent now. Are you willing to proceed with your visit today? Kelsey Carroll has provided verbal consent on 03/25/2022 for a virtual visit (video or telephone). Piedad Climes, [place]  Date: 03/25/2022 2:17 PM  Virtual Visit via Video Note   I, Piedad Climes, connected with  Kelsey Carroll  ([id number], 12/01/1995) on 03/25/22 at  2:15 PM EDT by a video-enabled telemedicine application and verified that I am speaking with the correct person using two identifiers.  Location: Patient: Virtual Visit  Location Patient: Home Provider: Virtual Visit Location Provider: Home Office   I discussed the limitations of evaluation and management by telemedicine and the availability of in person appointments. The patient expressed understanding and agreed to proceed.    History of Present Illness: Kelsey Carroll is a 26 y.o. who identifies as a female who was assigned female at birth, and is being seen today for painful menstrual period. Notes period starting 1.5 days ago with significant pelvic cramping. Notes this is typical for her where cramping builds through Day 3 and then usually gets better very quickly before completely dissipating. Denies anything abnormal for her with her flow this time. Denies trauma or injury. Was unable to go to work today due to pain. Has been resting and taking Aleve OTC.  Aleve, Midol HPI: HPI  Problems:  Patient Active Problem List   Diagnosis Date Noted   Recurrent bacterial infection 12/10/2021   Dysmenorrhea 04/16/2020   Depression 01/14/2013   Hydradenitis 02/29/2012   Impaired hearing 02/29/2012   Obesity (BMI 35.0-39.9 without comorbidity) 02/29/2012    Allergies: No Known Allergies Medications:  Current Outpatient Medications:    diazepam (VALIUM) 5 MG tablet, Make take 1 tablet 30 minutes prior to flight (Patient not taking: Reported on 01/19/2022), Disp: 6 tablet, Rfl: 0   HUMIRA PEN 80 MG/0.8ML PNKT, Inject into the skin., Disp: , Rfl:    Levonorgestrel-Ethinyl Estradiol (SEASONIQUE) 0.15-0.03 &0.01 MG tablet, Seasonique 0.15 mg-30 mcg (84)/10 mcg(7) tablets,3 month dose pack  Take 1 tablet  every day by oral route., Disp: , Rfl:    Levonorgestrel-Ethinyl Estradiol (SIMPESSE) 0.15-0.03 &0.01 MG tablet, Take 1 tablet by mouth daily., Disp: , Rfl:   Observations/Objective: Patient is well-developed, well-nourished in no acute distress.  Resting comfortably at home.  Head is normocephalic, atraumatic.  No labored breathing. Speech is clear and  coherent with logical content.  Patient is alert and oriented at baseline.   Assessment and Plan: 1. Menses painful  This is unfortunately more common for her. Denies any new or atypical symptoms. Work for note granted. Supportive measures and OTC medications reviewed. Will give her Rx Meloxicam to cut down on need for frequent OTC dosings.  Follow Up Instructions: I discussed the assessment and treatment plan with the patient. The patient was provided an opportunity to ask questions and all were answered. The patient agreed with the plan and demonstrated an understanding of the instructions.  A copy of instructions were sent to the patient via MyChart unless otherwise noted below.   The patient was advised to call back or seek an in-person evaluation if the symptoms worsen or if the condition fails to improve as anticipated.  Time:  I spent 10 minutes with the patient via telehealth technology discussing the above problems/concerns.    Piedad Climes, PA-C

## 2022-03-25 NOTE — Patient Instructions (Addendum)
  Kelsey Carroll, thank you for joining Piedad Climes, PA-C for today's virtual visit.  While this provider is not your primary care provider (PCP), if your PCP is located in our provider database this encounter information will be shared with them immediately following your visit.  Consent: (Patient) Kelsey Carroll provided verbal consent for this virtual visit at the beginning of the encounter.  Current Medications:  Current Outpatient Medications:    diazepam (VALIUM) 5 MG tablet, Make take 1 tablet 30 minutes prior to flight (Patient not taking: Reported on 01/19/2022), Disp: 6 tablet, Rfl: 0   HUMIRA PEN 80 MG/0.8ML PNKT, Inject into the skin., Disp: , Rfl:    Levonorgestrel-Ethinyl Estradiol (SEASONIQUE) 0.15-0.03 &0.01 MG tablet, Seasonique 0.15 mg-30 mcg (84)/10 mcg(7) tablets,3 month dose pack  Take 1 tablet every day by oral route., Disp: , Rfl:    Levonorgestrel-Ethinyl Estradiol (SIMPESSE) 0.15-0.03 &0.01 MG tablet, Take 1 tablet by mouth daily., Disp: , Rfl:    Medications ordered in this encounter:  No orders of the defined types were placed in this encounter.    *If you need refills on other medications prior to your next appointment, please contact your pharmacy*  Follow-Up: Call back or seek an in-person evaluation if the symptoms worsen or if the condition fails to improve as anticipated.  Other Instructions Please keep hydrated and rest. Heating pad over lower abdomen in 10-15 minute intervals Use the Meloxicam once daily in place of Ibuprofen or Aleve  Sarna lotion OTC is the OTC cream for itch  If you have been instructed to have an in-person evaluation today at a local Urgent Care facility, please use the link below. It will take you to a list of all of our available [facility], including address, phone number and hours of operation. Please do not delay care.  [facility]  If you or a family member do not have a primary care  provider, use the link below to schedule a visit and establish care. When you choose a [facility] primary care physician or advanced practice provider, you gain a long-term partner in health. Find a Primary Care Provider  Learn more about [facility] in-office and virtual care options: [facility] Get Care Now

## 2022-03-30 DIAGNOSIS — Z111 Encounter for screening for respiratory tuberculosis: Secondary | ICD-10-CM | POA: Diagnosis not present

## 2022-06-07 ENCOUNTER — Emergency Department (HOSPITAL_BASED_OUTPATIENT_CLINIC_OR_DEPARTMENT_OTHER)
Admission: EM | Admit: 2022-06-07 | Discharge: 2022-06-07 | Disposition: A | Discharge status: Home / Self Care | Payer: BC Managed Care – PPO | Attending: Emergency Medicine | Admitting: Emergency Medicine

## 2022-06-07 ENCOUNTER — Encounter (HOSPITAL_BASED_OUTPATIENT_CLINIC_OR_DEPARTMENT_OTHER): Payer: Self-pay

## 2022-06-07 ENCOUNTER — Other Ambulatory Visit: Payer: Self-pay

## 2022-06-07 DIAGNOSIS — Z8616 Personal history of COVID-19: Secondary | ICD-10-CM | POA: Insufficient documentation

## 2022-06-07 DIAGNOSIS — J029 Acute pharyngitis, unspecified: Secondary | ICD-10-CM

## 2022-06-07 DIAGNOSIS — Z20822 Contact with and (suspected) exposure to covid-19: Secondary | ICD-10-CM | POA: Insufficient documentation

## 2022-06-07 DIAGNOSIS — R070 Pain in throat: Secondary | ICD-10-CM | POA: Diagnosis not present

## 2022-06-07 LAB — RESP PANEL BY RT-PCR (FLU A&B, COVID) ARPGX2
Influenza A by PCR: NEGATIVE
Influenza B by PCR: NEGATIVE
SARS Coronavirus 2 by RT PCR: NEGATIVE

## 2022-06-07 NOTE — ED Provider Notes (Signed)
[facility] EMERGENC[facility] DEPT Provider Note   CSN: [id number] Arrival date & time: 06/07/22  [id number]     History  Chief Complaint  Patient presents with   Sore Throat    Kelsey Carroll is a 26 y.o. female.  HPI     This is a 26 year old female who presents with sore throat.  She states she has had sore throat since Saturday.  Her boyfriend is sick with a cough and upper respiratory symptoms.  She would like to be tested for COVID-19 given that her boyfriend is also sick.  She has not had any fevers.  She denies nausea, vomiting, chest pain, shortness of breath.  She has had a slight nonproductive cough.  Home Medications Prior to Admission medications   Medication Sig Start Date End Date Taking? Authorizing Provider  diazepam (VALIUM) 5 MG tablet Make take 1 tablet 30 minutes prior to flight Patient not taking: Reported on 01/19/2022 07/29/21   Fenton Foy, NP  HUMIRA PEN 80 MG/0.8ML PNKT Inject into the skin. 10/08/21   [provider]  Levonorgestrel-Ethinyl Estradiol (SEASONIQUE) 0.15-0.03 &0.01 MG tablet Seasonique 0.15 mg-30 mcg (84)/10 mcg(7) tablets,3 month dose pack  Take 1 tablet every day by oral route.    [provider]  Levonorgestrel-Ethinyl Estradiol (SIMPESSE) 0.15-0.03 &0.01 MG tablet Take 1 tablet by mouth daily. 04/16/20   [provider]      Allergies    Patient has no known allergies.    Review of Systems   Review of Systems  Constitutional:  Negative for fever.  HENT:  Positive for sore throat.   Respiratory:  Positive for cough.   All other systems reviewed and are negative.   Physical Exam Updated Vital Signs BP 111/88 (BP Location: Right Arm)   Pulse 79   Resp 20   Ht 1.676 m (5\' 6" )   Wt 95.3 kg   SpO2 100%   BMI 33.89 kg/m  Physical Exam Vitals and nursing note reviewed.  Constitutional:      Appearance: She is well-developed. She is obese. She is not ill-appearing.  HENT:     Head:  Normocephalic and atraumatic.     Mouth/Throat:     Mouth: Mucous membranes are moist.     Comments: No oropharyngeal swelling or erythema, no exudate noted, uvula midline Eyes:     Pupils: Pupils are equal, round, and reactive to light.  Cardiovascular:     Rate and Rhythm: Normal rate and regular rhythm.     Heart sounds: Normal heart sounds.  Pulmonary:     Effort: Pulmonary effort is normal. No respiratory distress.     Breath sounds: No wheezing.  Abdominal:     Palpations: Abdomen is soft.  Musculoskeletal:     Cervical back: Neck supple.  Skin:    General: Skin is warm and dry.  Neurological:     Mental Status: She is alert and oriented to person, place, and time.  Psychiatric:        Mood and Affect: Mood normal.     ED Results / Procedures / Treatments   Labs (all labs ordered are listed, but only abnormal results are displayed) Labs Reviewed  RESP PANEL BY RT-PCR (FLU A&B, COVID) [id number]    EKG None  Radiology No results found.  Procedures Procedures    Medications Ordered in ED Medications - No data to display  ED Course/ Medical Decision Making/ A&P  Medical Decision Making  This patient presents to the ED for concern of sore throat, this involves an extensive number of treatment options, and is a complaint that carries with it a high risk of complications and morbidity.  I considered the following differential and admission for this acute, potentially life threatening condition.  The differential diagnosis includes viral pharyngitis secondary to COVID or other upper respiratory virus, strep, tonsillitis  MDM:    This is a 26 year old female who presents with sore throat.  Known sick contact with boyfriend.  Boyfriend has similar symptoms.  She is overall nontoxic and vital signs are reassuring.  She is afebrile.  She has no tonsillar exudate and has presence of cough which would make strep highly unlikely.  Will test for COVID  and influenza.  Breath sounds are clear.  Doubt pneumonia or other bacterial infection.  Patient will check MyChart for COVID results.  If positive she will follow CDC quarantine recommendations.  (Labs, imaging, consults)  Labs: I Ordered, and personally interpreted labs.  The pertinent results include: COVID and influenza testing pending  Imaging Studies ordered: I ordered imaging studies including none I independently visualized and interpreted imaging. I agree with the radiologist interpretation  Additional history obtained from chart review.  External records from outside source obtained and reviewed including prior evaluations  Cardiac Monitoring: The patient was maintained on a cardiac monitor.  I personally viewed and interpreted the cardiac monitored which showed an underlying rhythm of: Sinus rhythm  Reevaluation: After the interventions noted above, I reevaluated the patient and found that they have :stayed the same  Social Determinants of Health:  lives independently  Disposition: Discharge  Co morbidities that complicate the patient evaluation  Past Medical History:  Diagnosis Date   Chronic back pain    Depression    [employer]reesia 02/11/2020   Obesity      Medicines No orders of the defined types were placed in this encounter.   I have reviewed the patients home medicines and have made adjustments as needed  Problem List / ED Course: Problem List Items Addressed This Visit   None Visit Diagnoses     Sore throat    -  Primary                   Final Clinical Impression(s) / ED Diagnoses Final diagnoses:  Sore throat    Rx / DC Orders ED Discharge Orders     None         Shon Baton, MD 06/07/22 [phone]

## 2022-06-07 NOTE — Discharge Instructions (Signed)
You were seen today for sore throat.  Your COVID and influenza testing are pending.  Use Tylenol or ibuprofen as needed for pain.  Make sure that you are staying hydrated.  Check MyChart for your results.  Follow CDC guidelines regarding quarantine if positive.

## 2022-06-07 NOTE — ED Triage Notes (Signed)
Pt self ambulated to exam 12 c/o sore throat x 2 days. Pt on room air speaking in full clear sentences. VSS NAD PT denies fever or chills. PT states she wants to be tested for Covid.

## 2022-07-20 ENCOUNTER — Encounter: Payer: Self-pay | Admitting: Family Medicine

## 2022-07-20 ENCOUNTER — Ambulatory Visit (INDEPENDENT_AMBULATORY_CARE_PROVIDER_SITE_OTHER): Payer: BC Managed Care – PPO | Admitting: Family Medicine

## 2022-07-20 VITALS — BP 110/68 | HR 85 | Temp 98.0°F | Resp 16 | Ht 66.0 in | Wt 230.6 lb

## 2022-07-20 DIAGNOSIS — F32A Depression, unspecified: Secondary | ICD-10-CM | POA: Diagnosis not present

## 2022-07-20 DIAGNOSIS — F5231 Female orgasmic disorder: Secondary | ICD-10-CM

## 2022-07-20 DIAGNOSIS — Z30011 Encounter for initial prescription of contraceptive pills: Secondary | ICD-10-CM

## 2022-07-20 DIAGNOSIS — Z7689 Persons encountering health services in other specified circumstances: Secondary | ICD-10-CM

## 2022-07-20 MED ORDER — SERTRALINE HCL 50 MG PO TABS: 50.0000 mg | ORAL_TABLET | Freq: Every day | ORAL | 1 refills | Status: DC

## 2022-07-20 MED ORDER — LEVONORGEST-ETH ESTRAD 91-DAY 0.15-0.03 &0.01 MG PO TABS: ORAL_TABLET | ORAL | 1 refills | Status: DC

## 2022-07-20 NOTE — Progress Notes (Unsigned)
Patent came with c/o of "having intense organism  during and after sex". Patient said that her organism make her sick at times.

## 2022-07-21 ENCOUNTER — Encounter: Payer: Self-pay | Admitting: Family Medicine

## 2022-07-21 NOTE — Progress Notes (Signed)
New Patient Office Visit  Subjective    Patient ID: Kelsey Carroll, female    DOB: 17-May-1996  Age: 27 y.o. MRN: [id number]  CC:  Chief Complaint  Patient presents with   Pelvic Pain    HPI [name] presents to establish care and for initiation/refill of birth control. Patient also reports that she has been having intense orgasms that are even painful.    Outpatient Encounter Medications as of 07/20/2022  Medication Sig   Levonorgestrel-Ethinyl Estradiol (SIMPESSE) 0.15-0.03 &0.01 MG tablet Take 1 tablet by mouth daily.   sertraline (ZOLOFT) 50 MG tablet Take 1 tablet (50 mg total) by mouth daily.   Adalimumab (HUMIRA-CD/UC/HS STARTER) 80 MG/0.8ML PNKT Humira(CF) Pen 80 mg/0.8 mL subcutaneous kit   Levonorgestrel-Ethinyl Estradiol (SEASONIQUE) 0.15-0.03 &0.01 MG tablet Seasonique 0.15 mg-30 mcg (84)/10 mcg(7) tablets,3 month dose pack  Take 1 tablet every day by oral route.   [DISCONTINUED] diazepam (VALIUM) 5 MG tablet Make take 1 tablet 30 minutes prior to flight (Patient not taking: Reported on 01/19/2022)   [DISCONTINUED] HUMIRA PEN 80 MG/0.8ML PNKT Inject into the skin.   [DISCONTINUED] Levonorgestrel-Ethinyl Estradiol (SEASONIQUE) 0.15-0.03 &0.01 MG tablet Seasonique 0.15 mg-30 mcg (84)/10 mcg(7) tablets,3 month dose pack  Take 1 tablet every day by oral route.   No facility-administered encounter medications on file as of 07/20/2022.    Past Medical History:  Diagnosis Date   Chronic back pain    Depression    [employer] 02/11/2020   Obesity     No past surgical history on file.  Family History  Problem Relation Age of Onset   Hypertension Mother 43   Anxiety disorder Mother    Hyperlipidemia Father 47   Stroke Maternal Grandmother    Anxiety disorder Sister     Social History   Socioeconomic History   Marital status: Single    Spouse name: Not on file   Number of children: Not on file   Years of education: Not on file   Highest education level: Not  on file  Occupational History   Not on file  Tobacco Use   Smoking status: Never   Smokeless tobacco: Never  Vaping Use   Vaping Use: Never used  Substance and Sexual Activity   Alcohol use: Yes    Comment: Occassional alcohol use   Drug use: Yes    Types: Marijuana   Sexual activity: Yes    Partners: Male    Birth control/protection: Pill  Other Topics Concern   Not on file  Social History Narrative   Lives with mother, sister, niece, nephew, and sister's boyfriend.       Diet:       Do you drink/ eat things with caffeine? Yes      Marital status:                               What year were you married ?       Do you live in a house, apartment,assistred living, condo, trailer, etc.)? House      Is it one or more stories? 1      How many persons live in your home ? 6      Do you have any pets in your home ?(please list) Yes, 1      Current or past profession:       Do you exercise?   No  Type & how often:       Do you have a living will? No      Do you have a DNR form?                       If not, do you want to discuss one?       Do you have signed POA?HPOA forms?                 If so, please bring to your        appointment         Social Determinants of Health   Financial Resource Strain: Not on file  Food Insecurity: Not on file  Transportation Needs: Not on file  Physical Activity: Not on file  Stress: Not on file  Social Connections: Not on file  Intimate Partner Violence: Not on file    Review of Systems  All other systems reviewed and are negative.       Objective    BP 110/68   Pulse 85   Temp 98 F (36.7 C) (Oral)   Resp 16   Ht 5\' 6"  (1.676 m)   Wt 230 lb 9.6 oz (104.6 kg)   BMI 37.22 kg/m   Physical Exam Vitals and nursing note reviewed.  Constitutional:      General: She is not in acute distress.    Appearance: She is obese.  Cardiovascular:     Rate and Rhythm: Normal rate and regular  rhythm.  Pulmonary:     Effort: Pulmonary effort is normal.     Breath sounds: Normal breath sounds.  Abdominal:     Palpations: Abdomen is soft.     Tenderness: There is no abdominal tenderness.  Neurological:     General: No focal deficit present.     Mental Status: She is alert and oriented to person, place, and time.  Psychiatric:        Mood and Affect: Affect normal. Mood is depressed.         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 Encounter for initial prescription of contraceptive pills Seasonique prescribed  3. Depression, unspecified depression type Zoloft 50 mg prescribed.   4. Encounter to establish care     Return in about 4 weeks (around 08/17/2022) for follow up.   Becky Sax, MD

## 2022-07-26 ENCOUNTER — Ambulatory Visit: Payer: BC Managed Care – PPO | Admitting: Family Medicine

## 2022-08-16 ENCOUNTER — Encounter: Payer: Self-pay | Admitting: Family Medicine

## 2022-08-16 ENCOUNTER — Ambulatory Visit (INDEPENDENT_AMBULATORY_CARE_PROVIDER_SITE_OTHER): Payer: BC Managed Care – PPO | Admitting: Family Medicine

## 2022-08-16 VITALS — BP 111/74 | HR 63 | Temp 98.1°F | Resp 16 | Wt 222.6 lb

## 2022-08-16 DIAGNOSIS — N946 Dysmenorrhea, unspecified: Secondary | ICD-10-CM | POA: Diagnosis not present

## 2022-08-16 DIAGNOSIS — Z6835 Body mass index (BMI) 35.0-35.9, adult: Secondary | ICD-10-CM | POA: Diagnosis not present

## 2022-08-16 DIAGNOSIS — F32A Depression, unspecified: Secondary | ICD-10-CM

## 2022-08-16 DIAGNOSIS — E669 Obesity, unspecified: Secondary | ICD-10-CM | POA: Diagnosis not present

## 2022-08-16 MED ORDER — LEVONORGEST-ETH ESTRAD 91-DAY 0.15-0.03 &0.01 MG PO TABS: ORAL_TABLET | ORAL | 1 refills | Status: DC

## 2022-08-16 MED ORDER — SERTRALINE HCL 50 MG PO TABS: 50.0000 mg | ORAL_TABLET | Freq: Every day | ORAL | 1 refills | Status: AC

## 2022-08-16 NOTE — Progress Notes (Unsigned)
Patient is here for their 1 month follow-up Patient has no concerns today Care gaps have been discussed with patient  

## 2022-08-17 ENCOUNTER — Encounter: Payer: Self-pay | Admitting: Family Medicine

## 2022-08-17 NOTE — Progress Notes (Signed)
Established Patient Office Visit  Subjective    Patient ID: Kelsey Carroll, female    DOB: 1995-07-27  Age: 27 y.o. MRN: [id number]  CC:  Chief Complaint  Patient presents with   Follow-up    HPI SIMAR POTHIER presents for routine follow up of med issues. Patient denies acute complaints or concerns.    Outpatient Encounter Medications as of 08/16/2022  Medication Sig   Adalimumab (HUMIRA-CD/UC/HS STARTER) 80 MG/0.8ML PNKT Humira(CF) Pen 80 mg/0.8 mL subcutaneous kit   Levonorgestrel-Ethinyl Estradiol (SEASONIQUE) 0.15-0.03 &0.01 MG tablet Seasonique 0.15 mg-30 mcg (84)/10 mcg(7) tablets,3 month dose pack  Take 1 tablet every day by oral route.   Levonorgestrel-Ethinyl Estradiol (SIMPESSE) 0.15-0.03 &0.01 MG tablet Take 1 tablet by mouth daily.   sertraline (ZOLOFT) 50 MG tablet Take 1 tablet (50 mg total) by mouth daily.   [DISCONTINUED] Levonorgestrel-Ethinyl Estradiol (SEASONIQUE) 0.15-0.03 &0.01 MG tablet Seasonique 0.15 mg-30 mcg (84)/10 mcg(7) tablets,3 month dose pack  Take 1 tablet every day by oral route.   [DISCONTINUED] sertraline (ZOLOFT) 50 MG tablet Take 1 tablet (50 mg total) by mouth daily.   No facility-administered encounter medications on file as of 08/16/2022.    Past Medical History:  Diagnosis Date   Chronic back pain    Depression    Phreesia 02/11/2020   Obesity     No past surgical history on file.  Family History  Problem Relation Age of Onset   Hypertension Mother 45   Anxiety disorder Mother    Hyperlipidemia Father 45   Stroke Maternal Grandmother    Anxiety disorder Sister     Social History   Socioeconomic History   Marital status: Single    Spouse name: Not on file   Number of children: Not on file   Years of education: Not on file   Highest education level: Not on file  Occupational History   Not on file  Tobacco Use   Smoking status: Never   Smokeless tobacco: Never  Vaping Use   Vaping Use: Never used  Substance and  Sexual Activity   Alcohol use: Yes    Comment: Occassional alcohol use   Drug use: Yes    Types: Marijuana   Sexual activity: Yes    Partners: Male    Birth control/protection: Pill  Other Topics Concern   Not on file  Social History Narrative   Lives with mother, sister, niece, nephew, and sister's boyfriend.       Diet:       Do you drink/ eat things with caffeine? Yes      Marital status:                               What year were you married ?       Do you live in a house, apartment,assistred living, condo, trailer, etc.)? House      Is it one or more stories? 1      How many persons live in your home ? 6      Do you have any pets in your home ?(please list) Yes, 1      Current or past profession:       Do you exercise?   No                           Type & how often:  Do you have a living will? No      Do you have a DNR form?                       If not, do you want to discuss one?       Do you have signed POA?HPOA forms?                 If so, please bring to your        appointment         Social Determinants of Health   Financial Resource Strain: Not on file  Food Insecurity: Not on file  Transportation Needs: Not on file  Physical Activity: Not on file  Stress: Not on file  Social Connections: Not on file  Intimate Partner Violence: Not on file    Review of Systems  All other systems reviewed and are negative.       Objective    BP 111/74   Pulse 63   Temp 98.1 F (36.7 C) (Oral)   Resp 16   Wt 222 lb 9.6 oz (101 kg)   SpO2 96%   BMI 35.93 kg/m   Physical Exam Vitals and nursing note reviewed.  Constitutional:      General: She is not in acute distress.    Appearance: She is obese.  Cardiovascular:     Rate and Rhythm: Normal rate and regular rhythm.  Pulmonary:     Effort: Pulmonary effort is normal.     Breath sounds: Normal breath sounds.  Abdominal:     Palpations: Abdomen is soft.     Tenderness: There is no  abdominal tenderness.  Neurological:     General: No focal deficit present.     Mental Status: She is alert and oriented to person, place, and time.  Psychiatric:        Mood and Affect: Mood and affect normal.         Assessment & Plan:   1. Depression, unspecified depression type Improved with present management. Meds refilled. continue  2. Dysmenorrhea Improved with present management. Meds refilled.   3. Obesity (BMI 35.0-39.9 without comorbidity) Discussed dietary and activity options. Goal is 2-4lbs/mo wt loss.  Return in about 3 months (around 11/14/2022) for follow up.   Becky Sax, MD

## 2022-08-25 NOTE — Progress Notes (Signed)
27 y.o. G1P0010 Single African American female here for NEW GYN/ received referral//Pt wanted to discuss painful orgasms that create nausea.  Orgasm pain started about 5 - 6 years ago. Pain persists for about 5 minutes with orgasm. No pain with intercourse, just orgasm.  For the last 1 - 2 months, she is not having this occur.   Ibuprofen does not work.  Takes a hot shower.  Had a pelvic US about 2 years at [facility], and no fibroids noted.   Taking current birth control pills for 3 years.   Her PCP started her on Zoloft 2 months ago to try to reduce orgasm response and help her mood.  Feels like it is helping a little bit.   Same partner for 2 years.  No recent STD testing.  No vaginal discharge or odor.   Usually seen at [facility].     PCP:   Dorna Mai, MD  Patient's last menstrual period was 09/05/2022.     Period Pattern: (!) Irregular (OCP causes every three months) Menstrual Flow: Moderate Dysmenorrhea: (!) Moderate     Sexually active: Yes.    The current method of family planning is OCP (estrogen/progesterone).    Exercising: No.     Smoker:  no  Health Maintenance: Pap:  02/14/20 neg History of abnormal Pap:  no MMG:  n/a Colonoscopy:  n/a BMD:   n/a  Result  n/a TDaP:  05/01/18 Gardasil:   unsure   reports that she has never smoked. She has never used smokeless tobacco. She reports current alcohol use. She reports current drug use. Drug: Marijuana.  Past Medical History:  Diagnosis Date   Chronic back pain    Depression    Phreesia 02/11/2020   Obesity     History reviewed. No pertinent surgical history.  Current Outpatient Medications  Medication Sig Dispense Refill   Adalimumab (HUMIRA-CD/UC/HS STARTER) 80 MG/0.8ML PNKT Humira(CF) Pen 80 mg/0.8 mL subcutaneous kit     Levonorgestrel-Ethinyl Estradiol (SIMPESSE) 0.15-0.03 &0.01 MG tablet Take 1 tablet by mouth daily.     sertraline (ZOLOFT) 50 MG tablet Take 1 tablet (50  mg total) by mouth daily. 90 tablet 1   No current facility-administered medications for this visit.    Family History  Problem Relation Age of Onset   Hypertension Mother 35   Anxiety disorder Mother    Hyperlipidemia Father 90   Stroke Maternal Grandmother    Anxiety disorder Sister     Review of Systems  All other systems reviewed and are negative.   Exam:   BP 120/80 (BP Location: Right Arm, Patient Position: Sitting, Cuff Size: Large)   Pulse 78   Ht '5\' 7"'$  (1.702 m)   Wt 224 lb (101.6 kg)   LMP 09/05/2022   SpO2 99%   BMI 35.08 kg/m     General appearance: alert, cooperative and appears stated age Head: normocephalic, without obvious abnormality, atraumatic Lungs: clear to auscultation bilaterally Heart: regular rate and rhythm Abdomen: soft, non-tender; no masses, no organomegaly Extremities: extremities normal, atraumatic, no cyanosis or edema Neurologic: grossly normal  Pelvic: External genitalia:  no lesions              No abnormal inguinal nodes palpated.              Urethra:  normal appearing urethra with no masses, tenderness or lesions              Bartholins and Skenes: normal  Vagina: normal appearing vagina with normal color and discharge, no lesions              Cervix: no lesions          Bimanual Exam:  Uterus:  normal size, contour, position, consistency, mobility, non-tender              Adnexa: no mass, fullness, tenderness              Rectal exam: yes.  Confirms.              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  STD screening.   Plan: GC/CT/trichomonas testing.  Declines serum STD screening.  Will refer for pelvic floor therapy. Rx for Flexeril prn.  I did discuss side effects of sleepiness and to not mix with ETOH or sedating medications. Continue COCs. Follow up in 3 months.  After visit summary provided.

## 2022-09-08 ENCOUNTER — Encounter: Payer: Self-pay | Admitting: Obstetrics and Gynecology

## 2022-09-08 ENCOUNTER — Ambulatory Visit: Payer: BC Managed Care – PPO | Admitting: Obstetrics and Gynecology

## 2022-09-08 ENCOUNTER — Other Ambulatory Visit (HOSPITAL_COMMUNITY)
Admission: RE | Admit: 2022-09-08 | Discharge: 2022-09-08 | Disposition: A | Discharge status: Home / Self Care | Payer: BC Managed Care – PPO | Source: Ambulatory Visit | Attending: Obstetrics and Gynecology | Admitting: Obstetrics and Gynecology

## 2022-09-08 VITALS — BP 120/80 | HR 78 | Ht 67.0 in | Wt 224.0 lb

## 2022-09-08 DIAGNOSIS — F5231 Female orgasmic disorder: Secondary | ICD-10-CM

## 2022-09-08 DIAGNOSIS — Z113 Encounter for screening for infections with a predominantly sexual mode of transmission: Secondary | ICD-10-CM | POA: Diagnosis not present

## 2022-09-08 MED ORDER — CYCLOBENZAPRINE HCL 5 MG PO TABS: 5.0000 mg | ORAL_TABLET | Freq: Three times a day (TID) | ORAL | 0 refills | Status: AC | PRN

## 2022-09-08 NOTE — Patient Instructions (Signed)
Metronidazole Vaginal Gel What is this medication? METRONIDAZOLE (me troe NI da zole) treats infections caused by too much bacteria in the vagina. It belongs to a group of medications called antibiotics. It will not treat yeast infections or infections caused by viruses. This medicine may be used for other purposes; ask your health care provider or pharmacist if you have questions. COMMON BRAND NAME(S): MetroGel, MetroGel Vaginal, MetroGel-Vaginal, NUVESSA, Vandazole What should I tell my care team before I take this medication? They need to know if you have any of these conditions: Cockayne syndrome History of blood diseases, such as sickle cell anemia, anemia, or leukemia History of yeast infection Frequently drink alcohol Liver disease An unusual or allergic reaction to metronidazole, parabens, other medications, foods, dyes, or preservatives Pregnant or trying to get pregnant Breastfeeding How should I use this medication? This medication is only for use in the vagina. Do not take by mouth or apply to other areas of the body. Follow the directions on the prescription label. Wash hands before and after use. Screw the applicator to the tube and squeeze the tube gently to fill the applicator. Lie on your back, part and bend your knees. Insert the applicator tip high in the vagina and push the plunger to release the gel into the vagina. Gently remove the applicator. Wash the applicator well with warm water and soap. Use at regular intervals. Finish the full course prescribed by your care team even if you think your condition is better. Do not stop using except on the advice of your care team. Talk to your care team about the use of this medication in children. While this medication maybe prescribed for children as young as 12 years for selected conditions, precautions do apply. Overdosage: If you think you have taken too much of this medicine contact a poison control center or emergency room at  once. NOTE: This medicine is only for you. Do not share this medicine with others. What if I miss a dose? If you miss a dose, use it as soon as you can. If it is almost time for your next dose, use only that dose. Do not use double or extra doses. What may interact with this medication? Do not take this medication with any of the following: Alcohol or any product containing alcohol Cisapride Disulfiram Dronedarone Pimozide Thioridazine This medication may also interact with the following: Amiodarone Busulfan Carbamazepine Cimetidine Cyclosporine Estrogen or progestin hormones Fluorouracil Lithium Other medications that cause heart rhythm changes, such as dofetilide, ziprasidone Phenobarbital Phenytoin Quinidine Tacrolimus Vecuronium Warfarin This list may not describe all possible interactions. Give your health care provider a list of all the medicines, herbs, non-prescription drugs, or dietary supplements you use. Also tell them if you smoke, drink alcohol, or use illegal drugs. Some items may interact with your medicine. What should I watch for while using this medication? Visit your care team for regular checks on your progress. Tell your care team if your symptoms do not start to get better or if they get worse. This medication may affect your coordination, reaction time, or judgment. Do not drive or operate machinery until you know how this medication affects you. Sit up or stand slowly to reduce the risk of dizzy or fainting spells. Drinking alcohol with this medication can increase the risk of these side effects. Ask your care team if you should avoid alcohol. Many nonprescription cough and cold products contain alcohol. Metronidazole can cause an unpleasant reaction when taken with alcohol. The reaction  includes flushing, headache, nausea, vomiting, sweating, and increased thirst. The reaction can last from 30 minutes to several hours. If you are being treated for a sexually  transmitted infection (STI), avoid sexual contact until you have finished your treatment. Your partner may also need treatment. What side effects may I notice from receiving this medication? Side effects that you should report to your care team as soon as possible: Allergic reactions--skin rash, itching, hives, swelling of the face, lips, tongue, or throat Dizziness, loss of balance or coordination, confusion or trouble speaking Fever, neck pain or stiffness, sensitivity to light, headache, nausea, vomiting, confusion Heart rhythm changes--fast or irregular heartbeat, dizziness, feeling faint or lightheaded, chest pain, trouble breathing Liver injury--right upper belly pain, loss of appetite, nausea, light-colored stool, dark yellow or brown urine, yellowing skin or eyes, unusual weakness or fatigue Pain, tingling, or numbness in the hands or feet Redness, blistering, peeling, or loosening of the skin, including inside the mouth Seizures Severe diarrhea, fever Sudden eye pain or change in vision such as blurry vision, seeing halos around lights, vision loss Unusual vaginal discharge, itching, or odor Side effects that usually do not require medical attention (report to your care team if they continue or are bothersome): Headache Irritation at application site Nausea Stomach pain This list may not describe all possible side effects. Call your doctor for medical advice about side effects. You may report side effects to FDA at 1-800-FDA-1088. Where should I keep my medication? Keep out of the reach of children. Store at room temperature between 15 and 30 degrees C (59 and 86 degrees F). Do not freeze. Throw away any unused medication after the expiration date. NOTE: This sheet is a summary. It may not cover all possible information. If you have questions about this medicine, talk to your doctor, pharmacist, or health care provider.  2023 Elsevier/Gold Standard (2004-09-07 00:00:00)

## 2022-09-09 ENCOUNTER — Telehealth: Payer: Self-pay | Admitting: Obstetrics and Gynecology

## 2022-09-09 DIAGNOSIS — F5231 Female orgasmic disorder: Secondary | ICD-10-CM

## 2022-09-09 LAB — CERVICOVAGINAL ANCILLARY ONLY
Chlamydia: NEGATIVE
Comment: NEGATIVE
Comment: NEGATIVE
Comment: NORMAL
Neisseria Gonorrhea: NEGATIVE
Trichomonas: NEGATIVE

## 2022-09-09 NOTE — Telephone Encounter (Signed)
Referral sent 

## 2022-09-09 NOTE — Telephone Encounter (Signed)
Please refer to [facility] pelvic floor therapy.  My patient has pain with orgasm.  She may have a component of vaginismus.

## 2022-09-15 NOTE — Telephone Encounter (Signed)
FYI. Pt scheduled for 09/20/2022. Will route to provider for final review and close encounter.

## 2022-09-17 NOTE — Therapy (Unsigned)
OUTPATIENT PHYSICAL THERAPY FEMALE PELVIC EVALUATION   Patient Name: Kelsey Carroll MRN: [id number] DOB:02-Mar-1996, 27 y.o., female Today's Date: 09/17/2022  END OF SESSION:   Past Medical History:  Diagnosis Date   Chronic back pain    Depression    Phreesia 02/11/2020   Obesity    No past surgical history on file. 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  Rationale for Evaluation and Treatment: Rehabilitation  ONSET DATE: 2018  SUBJECTIVE:                                                                                                                                                                                           SUBJECTIVE STATEMENT: painful orgasms that create nausea and persists for 5 minutes Fluid intake: {Yes/No:304960894}   PAIN:  Are you having pain? {yes/no:20286} NPRS scale: ***/10 Pain location: {pelvic pain location:27098}  Pain type: {type:313116} Pain description: {PAIN DESCRIPTION:21022940}   Aggravating factors: *** Relieving factors: ***  PRECAUTIONS: {Therapy precautions:24002}  WEIGHT BEARING RESTRICTIONS: {Yes ***/No:24003}  FALLS:  Has patient fallen in last 6 months? {fallsyesno:27318}  LIVING ENVIRONMENT: Lives with: {OPRC lives with:25569::"lives with their family"} Lives in: {Lives in:25570} Stairs: {opstairs:27293} Has following equipment at home: {Assistive devices:23999}  OCCUPATION: ***  PLOF: {PLOF:24004}  PATIENT GOALS: ***  PERTINENT HISTORY:  none Sexual abuse:  {Yes/No:304960894}  BOWEL MOVEMENT: Pain with bowel movement: {yes/no:20286} Type of bowel movement:{PT BM type:27100} Fully empty rectum: {Yes/No:304960894} Leakage: {Yes/No:304960894} Pads: {Yes/No:304960894} Fiber supplement: {Yes/No:304960894}  URINATION: Pain with urination: {yes/no:20286} Fully empty bladder: {Yes/No:304960894} Stream: {PT urination:27102} Urgency: {Yes/No:304960894} Frequency: *** Leakage: {PT leakage:27103} Pads: {Yes/No:304960894}  INTERCOURSE: Pain with intercourse: {pain with intercourse PA:27099} Ability to have vaginal penetration:  {Yes/No:304960894} Climax: yes but painful that lasts 5 minutes Marinoff Scale: ***/3  PREGNANCY: Vaginal deliveries *** Tearing {Yes***/No:304960894} C-section deliveries *** Currently pregnant {Yes***/No:304960894}  PROLAPSE: {PT prolapse:27101}   OBJECTIVE:   DIAGNOSTIC FINDINGS:  ***  PATIENT SURVEYS:  {rehab surveys:24030}  PFIQ-7 ***  COGNITION: Overall cognitive status: {cognition:24006}     SENSATION: Light touch: {intact/deficits:24005} Proprioception: {intact/deficits:24005}  MUSCLE LENGTH: Hamstrings: Right *** deg; Left *** deg Stofko test: Right *** deg; Left *** deg  LUMBAR SPECIAL TESTS:  {lumbar special  test:25242}  FUNCTIONAL TESTS:  {Functional tests:24029}  GAIT: Distance walked: *** Assistive device utilized: {Assistive devices:23999} Level of assistance: {Levels of assistance:24026} Comments: ***  POSTURE: {posture:25561}  PELVIC ALIGNMENT:  LUMBARAROM/PROM:  A/PROM A/PROM  eval  Flexion   Extension   Right lateral flexion   Left lateral flexion   Right rotation   Left rotation    (Blank rows = not tested)  LOWER EXTREMITY ROM:  {AROM/PROM:27142} ROM Right eval Left eval  Hip flexion    Hip extension    Hip abduction    Hip adduction    Hip internal rotation    Hip external rotation    Knee flexion    Knee extension    Ankle dorsiflexion     Ankle plantarflexion    Ankle inversion    Ankle eversion     (Blank rows = not tested)  LOWER EXTREMITY MMT:  MMT Right eval Left eval  Hip flexion    Hip extension    Hip abduction    Hip adduction    Hip internal rotation    Hip external rotation    Knee flexion    Knee extension    Ankle dorsiflexion    Ankle plantarflexion    Ankle inversion    Ankle eversion     PALPATION:   General  ***                External Perineal Exam ***                             Internal Pelvic Floor ***  Patient confirms identification and approves PT to assess internal pelvic floor and treatment {yes/no:20286}  PELVIC MMT:   MMT eval  Vaginal   Internal Anal Sphincter   External Anal Sphincter   Puborectalis   Diastasis Recti   (Blank rows = not tested)        TONE: ***  PROLAPSE: ***  TODAY'S TREATMENT:                                                                                                                              DATE: ***  EVAL ***   PATIENT EDUCATION:  Education details: *** Person educated: {Person educated:25204} Education method: {Education Method:25205} Education comprehension: {Education Comprehension:25206}  HOME EXERCISE PROGRAM: ***  ASSESSMENT:  CLINICAL IMPRESSION: Patient is a *** y.o. *** who was seen today for physical therapy evaluation and treatment for ***.   OBJECTIVE IMPAIRMENTS: {opptimpairments:25111}.   ACTIVITY LIMITATIONS: {activitylimitations:27494}  PARTICIPATION LIMITATIONS: {participationrestrictions:25113}  PERSONAL FACTORS: {Personal factors:25162} are also affecting patient's functional outcome.   REHAB POTENTIAL: {rehabpotential:25112}  CLINICAL DECISION MAKING: {clinical decision making:25114}  EVALUATION COMPLEXITY: {Evaluation complexity:25115}   GOALS: Goals reviewed with patient? {yes/no:20286}  SHORT TERM GOALS: Target date: ***  *** Baseline: Goal status: {GOALSTATUS:25110}  2.   *** Baseline:  Goal status: {GOALSTATUS:25110}  3.  *** Baseline:  Goal status: {GOALSTATUS:25110}  4.  *** Baseline:  Goal status: {GOALSTATUS:25110}  5.  *** Baseline:  Goal status: {GOALSTATUS:25110}  6.  *** Baseline:  Goal status: {GOALSTATUS:25110}  LONG TERM GOALS: Target date: ***  *** Baseline:  Goal status: {GOALSTATUS:25110}  2.  *** Baseline:  Goal status: {GOALSTATUS:25110}  3.  *** Baseline:  Goal status: {GOALSTATUS:25110}  4.  *** Baseline:  Goal status: {GOALSTATUS:25110}  5.  *** Baseline:  Goal status: {GOALSTATUS:25110}  6.  *** Baseline:  Goal status: {GOALSTATUS:25110}  PLAN:  PT FREQUENCY: {rehab frequency:25116}  PT DURATION: {rehab duration:25117}  PLANNED INTERVENTIONS: {rehab planned interventions:25118::"Therapeutic exercises","Therapeutic activity","Neuromuscular re-education","Balance training","Gait training","Patient/Family education","Self Care","Joint mobilization"}  PLAN FOR NEXT SESSION: ***   Gissel Keilman, PT 09/17/2022, 10:04 AM

## 2022-09-20 ENCOUNTER — Encounter: Payer: Self-pay | Admitting: Physical Therapy

## 2022-09-20 ENCOUNTER — Ambulatory Visit: Payer: BC Managed Care – PPO | Attending: Obstetrics and Gynecology | Admitting: Physical Therapy

## 2022-09-20 ENCOUNTER — Other Ambulatory Visit: Payer: Self-pay

## 2022-09-20 DIAGNOSIS — R102 Pelvic and perineal pain: Secondary | ICD-10-CM | POA: Diagnosis not present

## 2022-09-20 DIAGNOSIS — F5231 Female orgasmic disorder: Secondary | ICD-10-CM | POA: Insufficient documentation

## 2022-09-20 DIAGNOSIS — M62838 Other muscle spasm: Secondary | ICD-10-CM | POA: Diagnosis not present

## 2022-09-28 NOTE — Progress Notes (Signed)
Patient ID: ASTRIA MADRON, female    DOB: Nov 02, 1995  MRN: [id number]  CC: Weight Management   Subjective: Kelsey Carroll is a 27 y.o. female who presents for weight management.   Her concerns today include:  Would like to lose weight. Goal weight 160 to 170 pounds. States she is trying to monitor what she eats and plans to begin exercising soon. She is ready for referral to Weight Management Clinic. She would like to try medication to help with weight loss until then. No further issues/concerns for discussion today.   Patient Active Problem List   Diagnosis Date Noted   Recurrent bacterial infection 12/10/2021   Dysmenorrhea 04/16/2020   Marijuana user 12/07/2018   Depression 01/14/2013   Hydradenitis 02/29/2012   Impaired hearing 02/29/2012   Obesity (BMI 35.0-39.9 without comorbidity) 02/29/2012     Current Outpatient Medications on File Prior to Visit  Medication Sig Dispense Refill   Adalimumab (HUMIRA-CD/UC/HS STARTER) 80 MG/0.8ML PNKT Humira(CF) Pen 80 mg/0.8 mL subcutaneous kit     cyclobenzaprine (FLEXERIL) 5 MG tablet Take 1 tablet (5 mg total) by mouth 3 (three) times daily as needed for muscle spasms. 30 tablet 0   Levonorgestrel-Ethinyl Estradiol (SIMPESSE) 0.15-0.03 &0.01 MG tablet Take 1 tablet by mouth daily.     sertraline (ZOLOFT) 50 MG tablet Take 1 tablet (50 mg total) by mouth daily. 90 tablet 1   No current facility-administered medications on file prior to visit.    No Known Allergies  Social History   Socioeconomic History   Marital status: Single    Spouse name: Not on file   Number of children: Not on file   Years of education: Not on file   Highest education level: 12th grade  Occupational History   Not on file  Tobacco Use   Smoking status: Never   Smokeless tobacco: Never  Vaping Use   Vaping Use: Never used  Substance and Sexual Activity   Alcohol use: Yes    Comment: Occassional alcohol use   Drug use: Yes    Types: Marijuana    Sexual activity: Yes    Partners: Male    Birth control/protection: Pill  Other Topics Concern   Not on file  Social History Narrative   Lives with mother, sister, niece, nephew, and sister's boyfriend.       Diet:       Do you drink/ eat things with caffeine? Yes      Marital status:                               What year were you married ?       Do you live in a house, apartment,assistred living, condo, trailer, etc.)? House      Is it one or more stories? 1      How many persons live in your home ? 6      Do you have any pets in your home ?(please list) Yes, 1      Current or past profession:       Do you exercise?   No                           Type & how often:       Do you have a living will? No      Do you have a DNR form?  If not, do you want to discuss one?       Do you have signed POA?HPOA forms?                 If so, please bring to your        appointment         Social Determinants of Health   Financial Resource Strain: Medium Risk (10/01/2022)   Overall Financial Resource Strain (CARDIA)    Difficulty of Paying Living Expenses: Somewhat hard  Food Insecurity: Food Insecurity Present (10/01/2022)   Hunger Vital Sign    Worried About Running Out of Food in the Last Year: Sometimes true    Ran Out of Food in the Last Year: Sometimes true  Transportation Needs: No Transportation Needs (10/01/2022)   PRAPARE - Hydrologist (Medical): No    Lack of Transportation (Non-Medical): No  Physical Activity: Unknown (10/01/2022)   Exercise Vital Sign    Days of Exercise per Week: 0 days    Minutes of Exercise per Session: Not on file  Stress: No Stress Concern Present (10/01/2022)   [place]    Feeling of Stress : Only a little  Social Connections: Moderately Integrated (10/01/2022)   Social Connection and Isolation Panel [NHANES]    Frequency  of Communication with Friends and Family: More than three times a week    Frequency of Social Gatherings with Friends and Family: Once a week    Attends Religious Services: 1 to 4 times per year    Active Member of [employer] or Organizations: No    Attends Music therapist: Not on file    Marital Status: Living with partner  Intimate Partner Violence: Not on file    Family History  Problem Relation Age of Onset   Hypertension Mother 65   Anxiety disorder Mother    Hyperlipidemia Father 41   Stroke Maternal Grandmother    Anxiety disorder Sister     No past surgical history on file.  ROS: Review of Systems Negative except as stated above  PHYSICAL EXAM: BP 110/77 (BP Location: Left Arm, Patient Position: Sitting, Cuff Size: Large)   Pulse 77   Temp 98.3 F (36.8 C)   Resp 16   Wt 233 lb (105.7 kg)   LMP 09/05/2022   SpO2 98%   BMI 36.49 kg/m   Wt Readings from Last 3 Encounters:  10/01/22 233 lb (105.7 kg)  09/08/22 224 lb (101.6 kg)  08/16/22 222 lb 9.6 oz (101 kg)   Physical Exam HENT:     Head: Normocephalic and atraumatic.  Eyes:     Extraocular Movements: Extraocular movements intact.     Conjunctiva/sclera: Conjunctivae normal.     Pupils: Pupils are equal, round, and reactive to light.  Cardiovascular:     Rate and Rhythm: Normal rate and regular rhythm.     Pulses: Normal pulses.     Heart sounds: Normal heart sounds.  Pulmonary:     Effort: Pulmonary effort is normal.     Breath sounds: Normal breath sounds.  Musculoskeletal:     Cervical back: Normal range of motion and neck supple.  Neurological:     General: No focal deficit present.     Mental Status: She is alert and oriented to person, place, and time.  Psychiatric:        Mood and Affect: Mood normal.  Behavior: Behavior normal.      ASSESSMENT AND PLAN: 1. Encounter for weight management 2. BMI 36.0-36.9,adult 3. Weight gain - Phentermine as prescribed. Counseled on  medication adherence and adverse effects.  - I did check the [facility] prescription drug database and found no frequent prescribers of opiates or evidence of aberrant behavior. - Counseled on low-sodium DASH diet and 150 minutes of moderate intensity exercise per week as tolerated to assist with weight management.  - Referral to Medical Weight Management for further evaluation/management. During the interim follow-up with primary provider as scheduled.  - Amb Ref to Medical Weight Management - phentermine 15 MG capsule; Take 1 capsule (15 mg total) by mouth every morning.  Dispense: 30 capsule; Refill: 0   Patient was given the opportunity to ask questions.  Patient verbalized understanding of the plan and was able to repeat key elements of the plan. Patient was given clear instructions to go to Emergency Department or return to medical center if symptoms don't improve, worsen, or new problems develop.The patient verbalized understanding.   Orders Placed This Encounter  Procedures   Amb Ref to Medical Weight Management    Requested Prescriptions   Signed Prescriptions Disp Refills   phentermine 15 MG capsule 30 capsule 0    Sig: Take 1 capsule (15 mg total) by mouth every morning.    Return for Follow-Up or next available Dorna Mai, MD .  Camillia Herter, NP

## 2022-10-01 ENCOUNTER — Ambulatory Visit (INDEPENDENT_AMBULATORY_CARE_PROVIDER_SITE_OTHER): Payer: BC Managed Care – PPO | Admitting: Family

## 2022-10-01 VITALS — BP 110/77 | HR 77 | Temp 98.3°F | Resp 16 | Wt 233.0 lb

## 2022-10-01 DIAGNOSIS — R635 Abnormal weight gain: Secondary | ICD-10-CM | POA: Diagnosis not present

## 2022-10-01 DIAGNOSIS — Z6836 Body mass index (BMI) 36.0-36.9, adult: Secondary | ICD-10-CM | POA: Diagnosis not present

## 2022-10-01 DIAGNOSIS — Z7689 Persons encountering health services in other specified circumstances: Secondary | ICD-10-CM | POA: Diagnosis not present

## 2022-10-01 MED ORDER — PHENTERMINE HCL 15 MG PO CAPS: 15.0000 mg | ORAL_CAPSULE | ORAL | 0 refills | Status: DC

## 2022-10-01 NOTE — Patient Instructions (Signed)
Mediterranean Diet ?A Mediterranean diet refers to food and lifestyle choices that are based on the traditions of countries located on the [place] Sea. It focuses on eating more fruits, vegetables, whole grains, beans, nuts, seeds, and heart-healthy fats, and eating less dairy, meat, eggs, and processed foods with added sugar, salt, and fat. This way of eating has been shown to help prevent certain conditions and improve outcomes for people who have chronic diseases, like kidney disease and heart disease. ?What are tips for following this plan? ?Reading food labels ?Check the serving size of packaged foods. For foods such as rice and pasta, the serving size refers to the amount of cooked product, not dry. ?Check the total fat in packaged foods. Avoid foods that have saturated fat or trans fats. ?Check the ingredient list for added sugars, such as corn syrup. ?Shopping ? ?Buy a variety of foods that offer a balanced diet, including: ?Fresh fruits and vegetables (produce). ?Grains, beans, nuts, and seeds. Some of these may be available in unpackaged forms or large amounts (in bulk). ?Fresh seafood. ?Poultry and eggs. ?Low-fat dairy products. ?Buy whole ingredients instead of prepackaged foods. ?Buy fresh fruits and vegetables in-season from local farmers markets. ?Buy plain frozen fruits and vegetables. ?If you do not have access to quality fresh seafood, buy precooked frozen shrimp or canned fish, such as tuna, salmon, or sardines. ?Stock your pantry so you always have certain foods on hand, such as olive oil, canned tuna, canned tomatoes, rice, pasta, and beans. ?Cooking ?Cook foods with extra-virgin olive oil instead of using butter or other vegetable oils. ?Have meat as a side dish, and have vegetables or grains as your main dish. This means having meat in small portions or adding small amounts of meat to foods like pasta or stew. ?Use beans or vegetables instead of meat in common dishes like chili or  lasagna. ?Experiment with different cooking methods. Try roasting, broiling, steaming, and saut?ing vegetables. ?Add frozen vegetables to soups, stews, pasta, or rice. ?Add nuts or seeds for added healthy fats and plant protein at each meal. You can add these to yogurt, salads, or vegetable dishes. ?Marinate fish or vegetables using olive oil, lemon juice, garlic, and fresh herbs. ?Meal planning ?Plan to eat one vegetarian meal one day each week. Try to work up to two vegetarian meals, if possible. ?Eat seafood two or more times a week. ?Have healthy snacks readily available, such as: ?Vegetable sticks with hummus. ?Greek yogurt. ?Fruit and nut trail mix. ?Eat balanced meals throughout the week. This includes: ?Fruit: 2-3 servings a day. ?Vegetables: 4-5 servings a day. ?Low-fat dairy: 2 servings a day. ?Fish, poultry, or lean meat: 1 serving a day. ?Beans and legumes: 2 or more servings a week. ?Nuts and seeds: 1-2 servings a day. ?Whole grains: 6-8 servings a day. ?Extra-virgin olive oil: 3-4 servings a day. ?Limit red meat and sweets to only a few servings a month. ?Lifestyle ? ?Cook and eat meals together with your family, when possible. ?Drink enough fluid to keep your urine pale yellow. ?Be physically active every day. This includes: ?Aerobic exercise like running or swimming. ?Leisure activities like gardening, walking, or housework. ?Get 7-8 hours of sleep each night. ?If recommended by your health care provider, drink red wine in moderation. This means 1 glass a day for nonpregnant women and 2 glasses a day for men. A glass of wine equals 5 oz (150 mL). ?What foods should I eat? ?Fruits ?Apples. Apricots. Avocado. Berries. Bananas. Cherries. Dates.   Figs. Grapes. Lemons. Melon. Oranges. Peaches. Plums. Pomegranate. ?Vegetables ?Artichokes. Beets. Broccoli. Cabbage. Carrots. Eggplant. Green beans. Chard. Kale. Spinach. Onions. Leeks. Peas. Squash. Tomatoes. Peppers. Radishes. ?Grains ?Whole-grain pasta. Brown  rice. Bulgur wheat. Polenta. Couscous. Whole-wheat bread. Oatmeal. Quinoa. ?Meats and other proteins ?Beans. Almonds. Sunflower seeds. Pine nuts. Peanuts. Cod. Salmon. Scallops. Shrimp. Tuna. Tilapia. Clams. Oysters. Eggs. Poultry without skin. ?Dairy ?Low-fat milk. Cheese. Greek yogurt. ?Fats and oils ?Extra-virgin olive oil. Avocado oil. Grapeseed oil. ?Beverages ?Water. Red wine. Herbal tea. ?Sweets and desserts ?Greek yogurt with honey. Baked apples. Poached pears. Trail mix. ?Seasonings and condiments ?Basil. Cilantro. Coriander. Cumin. Mint. Parsley. Sage. Rosemary. Tarragon. Garlic. Oregano. Thyme. Pepper. Balsamic vinegar. Tahini. Hummus. Tomato sauce. Olives. Mushrooms. ?The items listed above may not be a complete list of foods and beverages you can eat. Contact a dietitian for more information. ?What foods should I limit? ?This is a list of foods that should be eaten rarely or only on special occasions. ?Fruits ?Fruit canned in syrup. ?Vegetables ?Deep-fried potatoes (french fries). ?Grains ?Prepackaged pasta or rice dishes. Prepackaged cereal with added sugar. Prepackaged snacks with added sugar. ?Meats and other proteins ?Beef. Pork. Lamb. Poultry with skin. Hot dogs. Bacon. ?Dairy ?Ice cream. Sour cream. Whole milk. ?Fats and oils ?Butter. Canola oil. Vegetable oil. Beef fat (tallow). Lard. ?Beverages ?Juice. Sugar-sweetened soft drinks. Beer. Liquor and spirits. ?Sweets and desserts ?Cookies. Cakes. Pies. Candy. ?Seasonings and condiments ?Mayonnaise. Pre-made sauces and marinades. ?The items listed above may not be a complete list of foods and beverages you should limit. Contact a dietitian for more information. ?Summary ?The Mediterranean diet includes both food and lifestyle choices. ?Eat a variety of fresh fruits and vegetables, beans, nuts, seeds, and whole grains. ?Limit the amount of red meat and sweets that you eat. ?If recommended by your health care provider, drink red wine in moderation.  This means 1 glass a day for nonpregnant women and 2 glasses a day for men. A glass of wine equals 5 oz (150 mL). ?This information is not intended to replace advice given to you by your health care provider. Make sure you discuss any questions you have with your health care provider. ?Document Revised: 08/03/2019 Document Reviewed: 05/31/2019 ?Elsevier Patient Education ? 2023 Elsevier Inc. ? ?

## 2022-10-01 NOTE — Progress Notes (Signed)
Pt presents for weight loss options -requested injectable advised that insurance is not covering weight loss injectables  -request referral to weight management clinic -would like to try something for weight loss in the meantime

## 2022-10-20 ENCOUNTER — Encounter: Payer: Self-pay | Admitting: Physical Therapy

## 2022-10-20 ENCOUNTER — Ambulatory Visit: Payer: BC Managed Care – PPO | Attending: Obstetrics and Gynecology | Admitting: Physical Therapy

## 2022-10-20 DIAGNOSIS — M62838 Other muscle spasm: Secondary | ICD-10-CM | POA: Diagnosis not present

## 2022-10-20 DIAGNOSIS — R102 Pelvic and perineal pain: Secondary | ICD-10-CM | POA: Insufficient documentation

## 2022-10-20 NOTE — Therapy (Signed)
OUTPATIENT PHYSICAL THERAPY TREATMENT NOTE   Patient Name: Kelsey Carroll MRN: [id number] DOB:[birth year], 27 y.o., female Today's Date: 10/20/2022  PCP: Georganna Skeans, MD  REFERRING PROVIDER: Patton Salles, MD   END OF SESSION:   PT End of Session - 10/20/22 1449     Visit Number 2    Date for PT Re-Evaluation 12/13/22    Authorization Type [employer]    PT Start Time 1445    PT Stop Time 1510    PT Time Calculation (min) 25 min    Activity Tolerance Patient tolerated treatment well    Behavior During Therapy [facility] for tasks assessed/performed             Past Medical History:  Diagnosis Date   Chronic back pain    Depression    [employer] 02/11/2020   Obesity    History reviewed. No pertinent surgical history. 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       PAIN:  Are you having pain? Yes NPRS scale: 10/10 Pain location:  clitoral area and lower stomach   Pain type: cramping like on your period Pain description: intermittent    Aggravating factors: when having an orgasm, at end of the intercourse Relieving factors: no intercourse   PRECAUTIONS: None   WEIGHT BEARING RESTRICTIONS: No    FALLS:  Has patient fallen in last 6 months? No   LIVING ENVIRONMENT: Lives with: lives with their family     OCCUPATION: packing and inspecting, standing   PLOF: Independent   PATIENT GOALS: not to have pain when orgasm   PERTINENT HISTORY:  none Sexual abuse: No   BOWEL MOVEMENT: Pain with bowel movement: No Type of bowel movement:Type (Bristol Stool Scale) Type 1, 2, 4, Frequency 3 to 7 days, Strain No, and Splinting none Fully empty rectum:  No Leakage: No Pads: No Fiber supplement: No   URINATION: Pain with urination: No Fully empty bladder: Yes:   Stream: Strong Urgency: No Frequency: average Leakage:  none Pads: No   INTERCOURSE: Pain with intercourse: Initial Penetration and During Climax, initial penetration when on top, no  lubrication Ability to have vaginal penetration:  Yes:   Climax: yes but painful that lasts 5 minutes Marinoff Scale: 1/3   PREGNANCY: Vaginal deliveries 1 with abortion         OBJECTIVE:    DIAGNOSTIC FINDINGS:  none   COGNITION: Overall cognitive status: Within functional limits for tasks assessed                          SENSATION: Light touch: Appears intact Proprioception: Appears intact         POSTURE: No Significant postural limitations   PELVIC ALIGNMENT: ASIS are equal   LUMBARAROM/PROM: full lumbar ROM     LOWER EXTREMITY ROM:   Passive ROM Right eval Left eval  Hip internal rotation 20 20  Hip external rotation 45 45      LOWER EXTREMITY MMT:   MMT Right eval Left eval  Hip extension 4/5 4/5  Hip adduction 4/5 4/5    PALPATION:                               Internal Pelvic Floor tenderness located in bilateral levator ani, obturator internist and muscles are firm   Patient confirms identification and approves PT to assess internal pelvic floor and treatment Yes   PELVIC MMT:   MMT eval  Vaginal 3/5 but difficult to relax  (Blank rows = not tested)         TONE: increased      TODAY'S TREATMENT:    10/20/22 Manual: Internal pelvic floor techniques: No emotional/communication barriers or cognitive limitation. Patient is motivated to learn. Patient understands and agrees with treatment goals and plan. PT explains patient will be examined in standing, sitting, and lying down to see how their muscles and joints work. When they are ready, they will be asked to remove their underwear so PT can examine their perineum. The patient is also given the option of providing their own chaperone as one is not provided in our facility. The patient also has the right and is explained the right to defer or refuse any part of the evaluation or treatment including the internal exam. With the patient's consent, PT will use one gloved finger to gently assess the muscles of the pelvic floor, seeing how well it contracts and relaxes and if there is muscle symmetry. After, the patient will get dressed and PT and patient will discuss exam findings and plan of care. PT and patient discuss plan of care, schedule, attendance policy and HEP activities.  Manual work around the clitoral hood to release the restrictions Manual work to the ischiocavernous and bulbocavernosus Release of the urogenital diaphragm Manual work to the perineal body Exercises: Stretches/mobility: Double knee to chest holding 30 sec Piriformis stretch supine bilateral holding for 30 sec Cat camel 15 x Frog leg stretch rocking back and forth 10 x Diaphragmatic breathing with pelvic floor drop Strengthening: Recumbent bike level 1 for 5 minutes while assessing patient   PATIENT EDUCATION:  10/20/22 Education details: Access Code: [email or web address] Person educated: Patient Education method: Explanation, Demonstration, ActorTactile cues, Verbal cues, and Handouts Education comprehension: verbalized  understanding, returned demonstration, verbal cues required, tactile cues required, and needs further education   HOME EXERCISE  PROGRAM: 10/20/22 Access Code: [email or web address] URL: [email or web address] Date: 10/20/2022 Prepared by: Eulis Foster  Program Notes massage the pelvic floor with 20 strokes on each side. use the right thumb on the left side of pelvic floor and reverse.   Exercises - Supine Diaphragmatic Breathing  - 3 x daily - 7 x weekly - 1 sets - 10 reps - Supine Double Knee to Chest  - 1 x daily - 7 x weekly - 1 sets - 1 reps - 30sec hold - Supine Piriformis Stretch with Leg Straight  - 1 x daily - 7 x weekly - 1 sets - 2 reps - 30 sec hold - Cat Cow  - 1 x daily - 7 x weekly - 1 sets - 10 reps   ASSESSMENT:   CLINICAL IMPRESSION: Patient is a 27 y.o. female who was seen today for physical therapy  treatment for orgasmic disorder.  Patient has learned hip stretches. She is beginning to be comfortable with therapist working on the pelvic floor. She is doing her HEP. Patient is able to do the pelvic floor drop with diaphragmatic breathing. Her tissue has elongated with the manual work. Patient will benefit from skilled therapy to improve relaxation of the pelvic floor and reduce pain.    OBJECTIVE IMPAIRMENTS: decreased strength, increased fascial restrictions, increased muscle spasms, and pain.    ACTIVITY LIMITATIONS: toileting   PARTICIPATION LIMITATIONS: interpersonal relationship   PERSONAL FACTORS: Fitness are also affecting patient's functional outcome.    REHAB POTENTIAL: Excellent   CLINICAL DECISION MAKING: Stable/uncomplicated   EVALUATION COMPLEXITY: Low     GOALS: Goals reviewed with patient? Yes   SHORT TERM GOALS: Target date: 10/08/22   Patient educated on how to massage the pelvic floor muscles to improve relaxation.  Baseline: Goal status: INITIAL   2.  Patient is able to do pelvic drop with diaphragmatic breathing to assist with relaxation after the muscles contract.  Baseline:  Goal status: INITIAL   3.  Patient is independent with hip stretches.  Baseline:   Goal status: Met 10/20/22     LONG TERM GOALS: Target date: 12/13/22   Patient independent with advanced HEP for stretches, core strength and pelvic floor relaxation.  Baseline:  Goal status: INITIAL   2.  Patient reports her pelvic pain decreased >/= 75% after contraction from climax due to reduction of trigger points.  Baseline:  Goal status: INITIAL   3.  Patient reports her perineal pain with penile penetration decreased >/= 75% due to relaxation of the pelvic floor muscles.  Baseline:  Goal status: INITIAL   4.  Patient reports she is able to have a bowel movement every 3 or less days to correct toileting technique and pelvic floor drop to relax the pelvic floor.  Baseline:  Goal status: INITIAL     PLAN:   PT FREQUENCY: 1x/week   PT DURATION: 12 weeks   PLANNED INTERVENTIONS: Therapeutic exercises, Therapeutic activity, Neuromuscular re-education, Patient/Family education, Joint mobilization, Dry Needling, Cryotherapy, Moist heat, Biofeedback, and Manual therapy   PLAN FOR NEXT SESSION: internal manual work to pelvic floor,   toileting, hip abductor strength  Eulis Foster, PT 10/20/22 3:25 PM

## 2022-10-25 ENCOUNTER — Ambulatory Visit (INDEPENDENT_AMBULATORY_CARE_PROVIDER_SITE_OTHER): Payer: BC Managed Care – PPO | Admitting: Family Medicine

## 2022-10-25 ENCOUNTER — Ambulatory Visit: Payer: BC Managed Care – PPO | Admitting: Physical Therapy

## 2022-10-25 ENCOUNTER — Other Ambulatory Visit (HOSPITAL_COMMUNITY)
Admission: RE | Admit: 2022-10-25 | Discharge: 2022-10-25 | Disposition: A | Discharge status: Home / Self Care | Payer: BC Managed Care – PPO | Source: Ambulatory Visit | Attending: Family Medicine | Admitting: Family Medicine

## 2022-10-25 VITALS — BP 111/77 | HR 89 | Temp 98.1°F | Resp 16 | Wt 231.0 lb

## 2022-10-25 DIAGNOSIS — R635 Abnormal weight gain: Secondary | ICD-10-CM

## 2022-10-25 DIAGNOSIS — Z7689 Persons encountering health services in other specified circumstances: Secondary | ICD-10-CM | POA: Diagnosis not present

## 2022-10-25 DIAGNOSIS — N898 Other specified noninflammatory disorders of vagina: Secondary | ICD-10-CM | POA: Insufficient documentation

## 2022-10-25 DIAGNOSIS — E669 Obesity, unspecified: Secondary | ICD-10-CM | POA: Diagnosis not present

## 2022-10-25 DIAGNOSIS — Z6836 Body mass index (BMI) 36.0-36.9, adult: Secondary | ICD-10-CM

## 2022-10-25 MED ORDER — PHENTERMINE HCL 15 MG PO CAPS: 15.0000 mg | ORAL_CAPSULE | ORAL | 0 refills | Status: DC

## 2022-10-25 NOTE — Progress Notes (Unsigned)
Patient is here for their 1 month follow-up Patient has no concerns today Care gaps have been discussed with patient  

## 2022-10-26 LAB — CERVICOVAGINAL ANCILLARY ONLY
Bacterial Vaginitis (gardnerella): POSITIVE — AB
Candida Glabrata: NEGATIVE
Candida Vaginitis: POSITIVE — AB
Chlamydia: NEGATIVE
Comment: NEGATIVE
Comment: NEGATIVE
Comment: NEGATIVE
Comment: NEGATIVE
Comment: NEGATIVE
Comment: NORMAL
Neisseria Gonorrhea: NEGATIVE
Trichomonas: NEGATIVE

## 2022-10-27 ENCOUNTER — Other Ambulatory Visit: Payer: Self-pay | Admitting: Family Medicine

## 2022-10-27 ENCOUNTER — Ambulatory Visit: Payer: BC Managed Care – PPO | Admitting: Physical Therapy

## 2022-10-27 ENCOUNTER — Telehealth: Payer: Self-pay | Admitting: *Deleted

## 2022-10-27 ENCOUNTER — Encounter: Payer: Self-pay | Admitting: Family Medicine

## 2022-10-27 MED ORDER — FLUCONAZOLE 150 MG PO TABS: 150.0000 mg | ORAL_TABLET | Freq: Once | ORAL | 0 refills | Status: AC

## 2022-10-27 MED ORDER — METRONIDAZOLE 500 MG PO TABS: 500.0000 mg | ORAL_TABLET | Freq: Two times a day (BID) | ORAL | 0 refills | Status: AC

## 2022-10-27 NOTE — Telephone Encounter (Signed)
I have attempted without success to contact this patient by phone to return their call, discuss lab results, and I left a message on answering machine.      Copied from CRM [phone]. Topic: General - Other >> Oct 26, 2022  3:31 PM Dominique A wrote: Reason for CRM: Pt is calling to speak with PCP. Per pt she has seen her lab results in mychart and is wanting to speak with the PCP regarding her results. Please advise.

## 2022-10-27 NOTE — Progress Notes (Signed)
Established Patient Office Visit  Subjective    Patient ID: Kelsey Carroll, female    DOB: 1996/01/19  Age: 27 y.o. MRN: [id number]  CC:  Chief Complaint  Patient presents with   Follow-up    HPI Kelsey Carroll presents for routine weight management as well as complaint of vaginal discharge.    Outpatient Encounter Medications as of 10/25/2022  Medication Sig   Adalimumab (HUMIRA-CD/UC/HS STARTER) 80 MG/0.8ML PNKT Humira(CF) Pen 80 mg/0.8 mL subcutaneous kit   cyclobenzaprine (FLEXERIL) 5 MG tablet Take 1 tablet (5 mg total) by mouth 3 (three) times daily as needed for muscle spasms.   Levonorgestrel-Ethinyl Estradiol (SIMPESSE) 0.15-0.03 &0.01 MG tablet Take 1 tablet by mouth daily.   sertraline (ZOLOFT) 50 MG tablet Take 1 tablet (50 mg total) by mouth daily.   [DISCONTINUED] phentermine 15 MG capsule Take 1 capsule (15 mg total) by mouth every morning.   phentermine 15 MG capsule Take 1 capsule (15 mg total) by mouth every morning.   No facility-administered encounter medications on file as of 10/25/2022.    Past Medical History:  Diagnosis Date   Chronic back pain    Depression    Phreesia 02/11/2020   Obesity     History reviewed. No pertinent surgical history.  Family History  Problem Relation Age of Onset   Hypertension Mother 87   Anxiety disorder Mother    Hyperlipidemia Father 82   Stroke Maternal Grandmother    Anxiety disorder Sister     Social History   Socioeconomic History   Marital status: Single    Spouse name: Not on file   Number of children: Not on file   Years of education: Not on file   Highest education level: 12th grade  Occupational History   Not on file  Tobacco Use   Smoking status: Never   Smokeless tobacco: Never  Vaping Use   Vaping Use: Never used  Substance and Sexual Activity   Alcohol use: Yes    Comment: Occassional alcohol use   Drug use: Yes    Types: Marijuana   Sexual activity: Yes    Partners: Male     Birth control/protection: Pill  Other Topics Concern   Not on file  Social History Narrative   Lives with mother, sister, niece, nephew, and sister's boyfriend.       Diet:       Do you drink/ eat things with caffeine? Yes      Marital status:                               What year were you married ?       Do you live in a house, apartment,assistred living, condo, trailer, etc.)? House      Is it one or more stories? 1      How many persons live in your home ? 6      Do you have any pets in your home ?(please list) Yes, 1      Current or past profession:       Do you exercise?   No                           Type & how often:       Do you have a living will? No      Do you have a DNR form?  If not, do you want to discuss one?       Do you have signed POA?HPOA forms?                 If so, please bring to your        appointment         Social Determinants of Health   Financial Resource Strain: Medium Risk (10/01/2022)   Overall Financial Resource Strain (CARDIA)    Difficulty of Paying Living Expenses: Somewhat hard  Food Insecurity: Food Insecurity Present (10/01/2022)   Hunger Vital Sign    Worried About Running Out of Food in the Last Year: Sometimes true    Ran Out of Food in the Last Year: Sometimes true  Transportation Needs: No Transportation Needs (10/01/2022)   PRAPARE - Administrator, Civil Service (Medical): No    Lack of Transportation (Non-Medical): No  Physical Activity: Unknown (10/01/2022)   Exercise Vital Sign    Days of Exercise per Week: 0 days    Minutes of Exercise per Session: Not on file  Stress: No Stress Concern Present (10/01/2022)   [employer] of Occupational Health - Occupational Stress Questionnaire    Feeling of Stress : Only a little  Social Connections: Moderately Integrated (10/01/2022)   Social Connection and Isolation Panel [NHANES]    Frequency of Communication with Friends and Family: More  than three times a week    Frequency of Social Gatherings with Friends and Family: Once a week    Attends Religious Services: 1 to 4 times per year    Active Member of [employer] or Organizations: No    Attends Engineer, structural: Not on file    Marital Status: Living with partner  Intimate Partner Violence: Not on file    Review of Systems  All other systems reviewed and are negative.       Objective    BP 111/77   Pulse 89   Temp 98.1 F (36.7 C) (Oral)   Resp 16   Wt 231 lb (104.8 kg)   SpO2 97%   BMI 36.18 kg/m   Physical Exam Vitals and nursing note reviewed.  Constitutional:      General: She is not in acute distress.    Appearance: She is obese.  Cardiovascular:     Rate and Rhythm: Normal rate and regular rhythm.  Pulmonary:     Effort: Pulmonary effort is normal.     Breath sounds: Normal breath sounds.  Abdominal:     Palpations: Abdomen is soft.     Tenderness: There is no abdominal tenderness.  Neurological:     General: No focal deficit present.     Mental Status: She is alert and oriented to person, place, and time.  Psychiatric:        Mood and Affect: Mood and affect normal.         Assessment & Plan:   1. Encounter for weight management Phentermine prescribed. Goal is 3-5lbs/mo wt loss - phentermine 15 MG capsule; Take 1 capsule (15 mg total) by mouth every morning.  Dispense: 30 capsule; Refill: 0  2. BMI 36.0-36.9,adult As above - phentermine 15 MG capsule; Take 1 capsule (15 mg total) by mouth every morning.  Dispense: 30 capsule; Refill: 0  3. Vaginal discharge  - Cervicovaginal ancillary only    Return in about 4 weeks (around 11/22/2022) for follow up.   Tommie Raymond, MD

## 2022-11-03 ENCOUNTER — Encounter: Payer: Self-pay | Admitting: Physical Therapy

## 2022-11-03 ENCOUNTER — Ambulatory Visit: Payer: BC Managed Care – PPO | Admitting: Physical Therapy

## 2022-11-03 DIAGNOSIS — M62838 Other muscle spasm: Secondary | ICD-10-CM

## 2022-11-03 DIAGNOSIS — R102 Pelvic and perineal pain: Secondary | ICD-10-CM | POA: Diagnosis not present

## 2022-11-03 NOTE — Patient Instructions (Signed)

## 2022-11-03 NOTE — Therapy (Addendum)
OUTPATIENT PHYSICAL THERAPY TREATMENT NOTE   Patient Name: Kelsey Carroll MRN: [id number] DOB:09-01-1995, 27 y.o., female Today's Date: 11/03/2022  PCP: Georganna Skeans, MD  REFERRING PROVIDER: Patton Salles, MD   END OF SESSION:   PT End of Session - 11/03/22 1448     Visit Number 3    Date for PT Re-Evaluation 12/13/22    Authorization Type BCBS    PT Start Time 1445    PT Stop Time 1525    PT Time Calculation (min) 40 min    Activity Tolerance Patient tolerated treatment well    Behavior During Therapy [facility] for tasks assessed/performed             Past Medical History:  Diagnosis Date   Chronic back pain    Depression    Phreesia 02/11/2020   Obesity    History reviewed. No pertinent surgical history. 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 The pressure during intercourse is not as bad. No abdominal cramps.        PAIN:  Are you having pain? Yes NPRS scale: 1/10 Pain location:  clitoral area and lower stomach   Pain type: cramping like on your period Pain description: intermittent    Aggravating factors: when having an orgasm, at end of the intercourse Relieving factors: no intercourse    PRECAUTIONS: None   WEIGHT BEARING RESTRICTIONS: No   FALLS:  Has patient fallen in last 6 months? No   LIVING ENVIRONMENT: Lives with: lives with their family     OCCUPATION: packing and inspecting, standing   PLOF: Independent   PATIENT GOALS: not to have pain when orgasm   PERTINENT HISTORY:  none Sexual abuse: No   BOWEL MOVEMENT: Pain with bowel movement: No Type of bowel movement:Type (Bristol Stool Scale) Type 1, 2, 4, Frequency 3  to 7 days, Strain No, and Splinting none Fully empty rectum: No Leakage: No Pads: No Fiber supplement: No   URINATION: Pain with urination: No Fully empty bladder: Yes:   Stream: Strong Urgency: No Frequency: average Leakage:  none Pads: No   INTERCOURSE: Pain with intercourse: Initial Penetration and During Climax, initial penetration when on top, no  lubrication Ability to have vaginal penetration:  Yes:   Climax: yes but painful that lasts 5 minutes Marinoff Scale: 1/3   PREGNANCY: Vaginal deliveries 1 with abortion         OBJECTIVE:    DIAGNOSTIC FINDINGS:  none   COGNITION: Overall cognitive status: Within functional limits for tasks assessed                          SENSATION: Light touch: Appears intact Proprioception: Appears intact         POSTURE: No Significant postural limitations   PELVIC ALIGNMENT: ASIS are equal   LUMBARAROM/PROM: full lumbar ROM     LOWER EXTREMITY ROM:   Passive ROM Right eval Left eval  Hip internal rotation 20 20  Hip external rotation 45 45      LOWER EXTREMITY MMT:   MMT Right eval Left eval  Hip extension 4/5 4/5  Hip adduction 4/5 4/5    PALPATION:                               Internal Pelvic Floor tenderness located in bilateral levator ani, obturator internist and muscles are firm   Patient confirms identification and approves PT to assess internal pelvic floor and treatment Yes   PELVIC MMT:   MMT eval  Vaginal 3/5 but difficult to relax   (Blank rows = not tested)         TONE: increased     TODAY'S TREATMENT:    11/03/22 Neuromuscular re-education: Down training: Diaphragmatic breathing in supine with therapist finger in the vaginal area to give tactile cues Diaphragmatic breathing in sitting on a small ball feeling the pelvic drop into the ball Exercises: Stretches/mobility: Double knee to chest holding 30 sec Piriformis stretch supine bilateral holding for 30 sec Strengthening: Side stepping with yellow band around knees to work the hip abductors Standing with slider under foot and push outward 20 x each leg Therapeutic activities: Functional strengthening activities: Siting with feet on squatty potty with diaphragmatic breathing to feel the pelvic floor drop  10/20/22 Manual: Internal pelvic floor techniques: No emotional/communication barriers or cognitive limitation. Patient is motivated to learn. Patient understands and agrees with treatment goals and plan. PT explains patient will be examined in standing, sitting, and lying down to see how their muscles and joints work. When they are ready, they will be asked to remove their underwear so PT can examine their perineum. The patient is also given the option of providing their own chaperone as one is not provided in our facility. The patient also has the right and is explained the right to defer or refuse any part of the evaluation or treatment including the internal exam. With the patient's consent, PT will use one gloved finger to gently assess the muscles of the pelvic floor, seeing how well it contracts and relaxes and if there is muscle symmetry. After, the patient will get dressed and PT and patient will discuss exam findings and plan of care. PT and patient discuss plan of care,  schedule, attendance policy and HEP activities.  Manual work around the clitoral hood to release the restrictions Manual work to the ischiocavernous and bulbocavernosus Release of the  urogenital diaphragm Manual work to the perineal body Exercises: Stretches/mobility: Double knee to chest holding 30 sec Piriformis stretch supine bilateral holding for 30 sec Cat camel 15 x Frog leg stretch rocking back and forth 10 x Diaphragmatic breathing with pelvic floor drop Strengthening: Recumbent bike level 1 for 5 minutes while assessing patient   PATIENT EDUCATION:  10/20/22 Education details: Access Code: [email or web address], educated patient on correct toileting Person educated: Patient Education method: Explanation, Demonstration, Tactile cues, Verbal cues, and Handouts Education comprehension: verbalized understanding, returned demonstration, verbal cues required, tactile cues required, and needs further education   HOME EXERCISE PROGRAM: 10/20/22 Access Code: [email or web address] URL: [email or web address] Date: 10/20/2022 Prepared by: Eulis Foster   Program Notes massage the pelvic floor with 20 strokes on each side. use the right thumb on the left side of pelvic floor and reverse.    Exercises - Supine Diaphragmatic Breathing  - 3 x daily - 7 x weekly - 1 sets - 10 reps - Supine Double Knee to Chest  - 1 x daily - 7 x weekly - 1 sets - 1 reps - 30sec hold - Supine Piriformis Stretch with Leg Straight  - 1 x daily - 7 x weekly - 1 sets - 2 reps - 30 sec hold - Cat Cow  - 1 x daily - 7 x weekly - 1 sets - 10 reps   ASSESSMENT:   CLINICAL IMPRESSION: Patient is a 26 y.o. female who was seen today for physical therapy  treatment for orgasmic disorder.  She has not had pain at the end of intercourse. Patient has not had the pain with initial vaginal penetration. Patient has not had the intense pain with orgasm or the lower abdominal pain.  Patient able to demonstrate correct toileting technique. Patient is working on hip abductor strength. Patient will benefit from skilled therapy to improve relaxation of the pelvic floor and reduce pain.    OBJECTIVE IMPAIRMENTS: decreased  strength, increased fascial restrictions, increased muscle spasms, and pain.    ACTIVITY LIMITATIONS: toileting   PARTICIPATION LIMITATIONS: interpersonal relationship   PERSONAL FACTORS: Fitness are also affecting patient's functional outcome.    REHAB POTENTIAL: Excellent   CLINICAL DECISION MAKING: Stable/uncomplicated   EVALUATION COMPLEXITY: Low     GOALS: Goals reviewed with patient? Yes   SHORT TERM GOALS: Target date: 10/08/22   Patient educated on how to massage the pelvic floor muscles to improve relaxation.  Baseline: Goal status: Met 11/03/22   2.  Patient is able to do pelvic drop with diaphragmatic breathing to assist with relaxation after the muscles contract.  Baseline:  Goal status: Met 11/03/22   3.  Patient is independent with hip stretches.  Baseline:  Goal status: Met 10/20/22     LONG TERM GOALS: Target date: 12/13/22   Patient independent with advanced HEP for stretches, core strength and pelvic floor relaxation.  Baseline:  Goal status: INITIAL   2.  Patient reports her pelvic pain decreased >/= 75% after contraction from climax due to reduction of trigger points.  Baseline:  Goal status: INITIAL   3.  Patient reports her perineal pain with penile penetration decreased >/= 75% due to relaxation of the pelvic floor muscles.  Baseline:  Goal status: INITIAL   4.  Patient reports she is able to have a bowel movement every 3 or  less days to correct toileting technique and pelvic floor drop to relax the pelvic floor.  Baseline:  Goal status: INITIAL     PLAN:   PT FREQUENCY: 1x/week   PT DURATION: 12 weeks   PLANNED INTERVENTIONS: Therapeutic exercises, Therapeutic activity, Neuromuscular re-education, Patient/Family education, Joint mobilization, Dry Needling, Cryotherapy, Moist heat, Biofeedback, and Manual therapy   PLAN FOR NEXT SESSION: internal manual work to pelvic floor,   toileting, hip abductor strength   Eulis Foster, PT 11/03/22  2:53 PM   PHYSICAL THERAPY DISCHARGE SUMMARY  Visits from Start of Care: 3  Current functional level related to goals / functional outcomes: See above. Patient called today to be discharged due to financial reasons.    Remaining deficits: See above.    Education / Equipment: HEP   Patient agrees to discharge. Patient goals were not met. Patient is being discharged due to financial reasons. Thank you for the referral. Eulis Foster, PT 11/17/22 3:29 PM

## 2022-11-08 ENCOUNTER — Ambulatory Visit: Payer: BC Managed Care – PPO | Admitting: Physical Therapy

## 2022-11-15 ENCOUNTER — Ambulatory Visit: Payer: BC Managed Care – PPO | Admitting: Family Medicine

## 2022-11-17 ENCOUNTER — Ambulatory Visit: Payer: BC Managed Care – PPO | Admitting: Physical Therapy

## 2022-11-22 ENCOUNTER — Ambulatory Visit (INDEPENDENT_AMBULATORY_CARE_PROVIDER_SITE_OTHER): Payer: BC Managed Care – PPO | Admitting: Family

## 2022-11-22 ENCOUNTER — Encounter: Payer: Self-pay | Admitting: Family

## 2022-11-22 VITALS — BP 111/73 | HR 85 | Temp 99.3°F | Resp 16 | Wt 233.6 lb

## 2022-11-22 DIAGNOSIS — J029 Acute pharyngitis, unspecified: Secondary | ICD-10-CM | POA: Diagnosis not present

## 2022-11-22 LAB — POCT RAPID STREP A (OFFICE): Rapid Strep A Screen: NEGATIVE

## 2022-11-22 NOTE — Progress Notes (Signed)
-  Patent c/o right side throat pain x 4days. Patient said it has gotten better but still uncomfortable  - no other concerns

## 2022-11-22 NOTE — Progress Notes (Signed)
Patient ID: Kelsey Carroll, female    DOB: Mar 14, 1996  MRN: [id number]  CC: Sore Throat  Subjective: Kelsey Carroll is a 27 y.o. female who presents for sore throat.   Her concerns today include:  Sore throat x 4 days. Since began has gradually improved. She denies associated red flag symptoms. Using over-the-counter medications to help. Requests work letter for tomorrow. No further issues/concerns for discussion today.   Patient Active Problem List   Diagnosis Date Noted   Recurrent bacterial infection 12/10/2021   Dysmenorrhea 04/16/2020   Marijuana user 12/07/2018   Depression 01/14/2013   Hydradenitis 02/29/2012   Impaired hearing 02/29/2012   Obesity (BMI 35.0-39.9 without comorbidity) 02/29/2012     Current Outpatient Medications on File Prior to Visit  Medication Sig Dispense Refill   Adalimumab (HUMIRA-CD/UC/HS STARTER) 80 MG/0.8ML PNKT Humira(CF) Pen 80 mg/0.8 mL subcutaneous kit     cyclobenzaprine (FLEXERIL) 5 MG tablet Take 1 tablet (5 mg total) by mouth 3 (three) times daily as needed for muscle spasms. 30 tablet 0   Levonorgestrel-Ethinyl Estradiol (SIMPESSE) 0.15-0.03 &0.01 MG tablet Take 1 tablet by mouth daily.     phentermine 15 MG capsule Take 1 capsule (15 mg total) by mouth every morning. 30 capsule 0   sertraline (ZOLOFT) 50 MG tablet Take 1 tablet (50 mg total) by mouth daily. 90 tablet 1   No current facility-administered medications on file prior to visit.    No Known Allergies  Social History   Socioeconomic History   Marital status: Single    Spouse name: Not on file   Number of children: Not on file   Years of education: Not on file   Highest education level: 12th grade  Occupational History   Not on file  Tobacco Use   Smoking status: Never   Smokeless tobacco: Never  Vaping Use   Vaping Use: Never used  Substance and Sexual Activity   Alcohol use: Yes    Comment: Occassional alcohol use   Drug use: Yes    Types: Marijuana    Sexual activity: Yes    Partners: Male    Birth control/protection: Pill  Other Topics Concern   Not on file  Social History Narrative   Lives with mother, sister, niece, nephew, and sister's boyfriend.       Diet:       Do you drink/ eat things with caffeine? Yes      Marital status:                               What year were you married ?       Do you live in a house, apartment,assistred living, condo, trailer, etc.)? House      Is it one or more stories? 1      How many persons live in your home ? 6      Do you have any pets in your home ?(please list) Yes, 1      Current or past profession:       Do you exercise?   No                           Type & how often:       Do you have a living will? No      Do you have a DNR form?  If not, do you want to discuss one?       Do you have signed POA?HPOA forms?                 If so, please bring to your        appointment         Social Determinants of Health   Financial Resource Strain: Medium Risk (10/01/2022)   Overall Financial Resource Strain (CARDIA)    Difficulty of Paying Living Expenses: Somewhat hard  Food Insecurity: Food Insecurity Present (10/01/2022)   Hunger Vital Sign    Worried About Running Out of Food in the Last Year: Sometimes true    Ran Out of Food in the Last Year: Sometimes true  Transportation Needs: No Transportation Needs (10/01/2022)   PRAPARE - Administrator, Civil Service (Medical): No    Lack of Transportation (Non-Medical): No  Physical Activity: Unknown (10/01/2022)   Exercise Vital Sign    Days of Exercise per Week: 0 days    Minutes of Exercise per Session: Not on file  Stress: No Stress Concern Present (10/01/2022)   [employer] of Occupational Health - Occupational Stress Questionnaire    Feeling of Stress : Only a little  Social Connections: Moderately Integrated (10/01/2022)   Social Connection and Isolation Panel [NHANES]    Frequency of  Communication with Friends and Family: More than three times a week    Frequency of Social Gatherings with Friends and Family: Once a week    Attends Religious Services: 1 to 4 times per year    Active Member of [employer] or Organizations: No    Attends Engineer, structural: Not on file    Marital Status: Living with partner  Intimate Partner Violence: Not on file    Family History  Problem Relation Age of Onset   Hypertension Mother 24   Anxiety disorder Mother    Hyperlipidemia Father 74   Stroke Maternal Grandmother    Anxiety disorder Sister     No past surgical history on file.  ROS: Review of Systems Negative except as stated above  PHYSICAL EXAM: BP 111/73   Pulse 85   Temp 99.3 F (37.4 C) (Oral)   Resp 16   Wt 233 lb 9.6 oz (106 kg)   SpO2 96%   BMI 36.59 kg/m   Physical Exam HENT:     Head: Normocephalic and atraumatic.     Nose: Nose normal.     Mouth/Throat:     Mouth: Mucous membranes are moist.     Pharynx: Oropharynx is clear.  Eyes:     Extraocular Movements: Extraocular movements intact.     Conjunctiva/sclera: Conjunctivae normal.     Pupils: Pupils are equal, round, and reactive to light.  Cardiovascular:     Rate and Rhythm: Normal rate and regular rhythm.     Pulses: Normal pulses.     Heart sounds: Normal heart sounds.  Pulmonary:     Effort: Pulmonary effort is normal.     Breath sounds: Normal breath sounds.  Musculoskeletal:     Cervical back: Normal range of motion and neck supple.  Neurological:     General: No focal deficit present.     Mental Status: She is alert and oriented to person, place, and time.  Psychiatric:        Mood and Affect: Mood normal.        Behavior: Behavior normal.    Results for orders placed  or performed in visit on 11/22/22  Rapid Strep A  Result Value Ref Range   Rapid Strep A Screen Negative Negative    ASSESSMENT AND PLAN: 1. Sore throat - Patient today in office with no  cardiopulmonary/acute distress. - Rapid Strep A negative.  - Routine screening.  - Patient provided with work letter.  - Follow-up with primary provider in 7 days or sooner if needed.  - Rapid Strep A; Future - Culture, Group A Strep - COVID-19, Flu A+B and RSV - Rapid Strep A   Patient was given the opportunity to ask questions.  Patient verbalized understanding of the plan and was able to repeat key elements of the plan. Patient was given clear instructions to go to Emergency Department or return to medical center if symptoms don't improve, worsen, or new problems develop.The patient verbalized understanding.   Orders Placed This Encounter  Procedures   Culture, Group A Strep   COVID-19, Flu A+B and RSV   Rapid Strep A    Return in about 1 week (around 11/29/2022) for Follow-Up or next available with Georganna Skeans, MD .  Rema Fendt, NP

## 2022-11-24 ENCOUNTER — Encounter: Payer: BC Managed Care – PPO | Admitting: Physical Therapy

## 2022-11-24 LAB — COVID-19, FLU A+B AND RSV
Influenza A, NAA: NOT DETECTED
Influenza B, NAA: NOT DETECTED
RSV, NAA: NOT DETECTED
SARS-CoV-2, NAA: NOT DETECTED

## 2022-11-24 LAB — CULTURE, GROUP A STREP: Strep A Culture: NEGATIVE

## 2022-11-29 NOTE — Progress Notes (Deleted)
GYNECOLOGY  VISIT   HPI: 27 y.o.   Single  African American  female   G1P0010 with No LMP recorded.   here for   3 mo f/u recheck  GYNECOLOGIC HISTORY: No LMP recorded. Contraception:  OCP Menopausal hormone therapy:  n/a Last mammogram:  n/a Last pap smear:   02/14/20 neg         OB History     Gravida  1   Para      Term      Preterm      AB  1   Living         SAB      IAB  1   Ectopic      Multiple      Live Births                 Patient Active Problem List   Diagnosis Date Noted   Recurrent bacterial infection 12/10/2021   Dysmenorrhea 04/16/2020   Marijuana user 12/07/2018   Depression 01/14/2013   Hydradenitis 02/29/2012   Impaired hearing 02/29/2012   Obesity (BMI 35.0-39.9 without comorbidity) 02/29/2012    Past Medical History:  Diagnosis Date   Chronic back pain    Depression    [employer]rees[employer] 02/11/2020   Obesity     No past surgical history on file.  Current Outpatient Medications  Medication Sig Dispense Refill   Adalimumab (HUMIRA-CD/UC/HS STARTER) 80 MG/0.8ML PNKT Humira(CF) Pen 80 mg/0.8 mL subcutaneous kit     cyclobenzaprine (FLEXERIL) 5 MG tablet Take 1 tablet (5 mg total) by mouth 3 (three) times daily as needed for muscle spasms. 30 tablet 0   Levonorgestrel-Ethinyl Estradiol (SIMPESSE) 0.15-0.03 &0.01 MG tablet Take 1 tablet by mouth daily.     phentermine 15 MG capsule Take 1 capsule (15 mg total) by mouth every morning. 30 capsule 0   sertraline (ZOLOFT) 50 MG tablet Take 1 tablet (50 mg total) by mouth daily. 90 tablet 1   No current facility-administered medications for this visit.     ALLERGIES: Patient has no known allergies.  Family History  Problem Relation Age of Onset   Hypertension Mother 62   Anxiety disorder Mother    Hyperlipidemia Father 95   Stroke Maternal Grandmother    Anxiety disorder Sister     Social History   Socioeconomic History   Marital status: Single    Spouse name: Not on file    Number of children: Not on file   Years of education: Not on file   Highest education level: 12th grade  Occupational History   Not on file  Tobacco Use   Smoking status: Never   Smokeless tobacco: Never  Vaping Use   Vaping Use: Never used  Substance and Sexual Activity   Alcohol use: Yes    Comment: Occassional alcohol use   Drug use: Yes    Types: Marijuana   Sexual activity: Yes    Partners: Male    Birth control/protection: Pill  Other Topics Concern   Not on file  Social History Narrative   Lives with mother, sister, niece, nephew, and sister's boyfriend.       Diet:       Do you drink/ eat things with caffeine? Yes      Marital status:                               What year were you  married ?       Do you live in a house, apartment,assistred living, condo, trailer, etc.)? House      Is it one or more stories? 1      How many persons live in your home ? 6      Do you have any pets in your home ?(please list) Yes, 1      Current or past profession:       Do you exercise?   No                           Type & how often:       Do you have a living will? No      Do you have a DNR form?                       If not, do you want to discuss one?       Do you have signed POA?HPOA forms?                 If so, please bring to your        appointment         Social Determinants of Health   Financial Resource Strain: Medium Risk (10/01/2022)   Overall Financial Resource Strain (CARDIA)    Difficulty of Paying Living Expenses: Somewhat hard  Food Insecurity: Food Insecurity Present (10/01/2022)   Hunger Vital Sign    Worried About Running Out of Food in the Last Year: Sometimes true    Ran Out of Food in the Last Year: Sometimes true  Transportation Needs: No Transportation Needs (10/01/2022)   PRAPARE - Administrator, Civil Service (Medical): No    Lack of Transportation (Non-Medical): No  Physical Activity: Unknown (10/01/2022)   Exercise Vital  Sign    Days of Exercise per Week: 0 days    Minutes of Exercise per Session: Not on file  Stress: No Stress Concern Present (10/01/2022)   [employer] of Occupational Health - Occupational Stress Questionnaire    Feeling of Stress : Only a little  Social Connections: Moderately Integrated (10/01/2022)   Social Connection and Isolation Panel [NHANES]    Frequency of Communication with Friends and Family: More than three times a week    Frequency of Social Gatherings with Friends and Family: Once a week    Attends Religious Services: 1 to 4 times per year    Active Member of [employer] or Organizations: No    Attends Engineer, structural: Not on file    Marital Status: Living with partner  Intimate Partner Violence: Not on file    Review of Systems  PHYSICAL EXAMINATION:    There were no vitals taken for this visit.    General appearance: alert, cooperative and appears stated age Head: Normocephalic, without obvious abnormality, atraumatic Neck: no adenopathy, supple, symmetrical, trachea midline and thyroid normal to inspection and palpation Lungs: clear to auscultation bilaterally Breasts: normal appearance, no masses or tenderness, No nipple retraction or dimpling, No nipple discharge or bleeding, No axillary or supraclavicular adenopathy Heart: regular rate and rhythm Abdomen: soft, non-tender, no masses,  no organomegaly Extremities: extremities normal, atraumatic, no cyanosis or edema Skin: Skin color, texture, turgor normal. No rashes or lesions Lymph nodes: Cervical, supraclavicular, and axillary nodes normal. No abnormal inguinal nodes palpated Neurologic: Grossly normal  Pelvic: External genitalia:  no lesions  Urethra:  normal appearing urethra with no masses, tenderness or lesions              Bartholins and Skenes: normal                 Vagina: normal appearing vagina with normal color and discharge, no lesions              Cervix: no lesions                 Bimanual Exam:  Uterus:  normal size, contour, position, consistency, mobility, non-tender              Adnexa: no mass, fullness, tenderness              Rectal exam: {yes no:314532}.  Confirms.              Anus:  normal sphincter tone, no lesions  Chaperone was present for exam:  ***  ASSESSMENT     PLAN     An After Visit Summary was printed and given to the patient.  ______ minutes face to face time of which over 50% was spent in counseling.

## 2022-11-29 NOTE — Progress Notes (Unsigned)
Patient ID: Kelsey Carroll, female    DOB: [birth year]  MRN: [id number]  CC: Follow-Up  Subjective: Kelsey Carroll is a 27 y.o. female who presents for follow-up.   Her concerns today include:  Please get more details from patient.    Patient Active Problem List   Diagnosis Date Noted   Recurrent bacterial infection 12/10/2021   Dysmenorrhea 04/16/2020   Marijuana user 12/07/2018   Depression 01/14/2013   Hydradenitis 02/29/2012   Impaired hearing 02/29/2012   Obesity (BMI 35.0-39.9 without comorbidity) 02/29/2012     Current Outpatient Medications on File Prior to Visit  Medication Sig Dispense Refill   Adalimumab (HUMIRA-CD/UC/HS STARTER) 80 MG/0.8ML PNKT Humira(CF) Pen 80 mg/0.8 mL subcutaneous kit     cyclobenzaprine (FLEXERIL) 5 MG tablet Take 1 tablet (5 mg total) by mouth 3 (three) times daily as needed for muscle spasms. 30 tablet 0   Levonorgestrel-Ethinyl Estradiol (SIMPESSE) 0.15-0.03 &0.01 MG tablet Take 1 tablet by mouth daily.     phentermine 15 MG capsule Take 1 capsule (15 mg total) by mouth every morning. 30 capsule 0   sertraline (ZOLOFT) 50 MG tablet Take 1 tablet (50 mg total) by mouth daily. 90 tablet 1   No current facility-administered medications on file prior to visit.    No Known Allergies  Social History   Socioeconomic History   Marital status: Single    Spouse name: Not on file   Number of children: Not on file   Years of education: Not on file   Highest education level: 12th grade  Occupational History   Not on file  Tobacco Use   Smoking status: Never   Smokeless tobacco: Never  Vaping Use   Vaping Use: Never used  Substance and Sexual Activity   Alcohol use: Yes    Comment: Occassional alcohol use   Drug use: Yes    Types: Marijuana   Sexual activity: Yes    Partners: Male    Birth control/protection: Pill  Other Topics Concern   Not on file  Social History Narrative   Lives with mother, sister, niece, nephew, and  sister's boyfriend.       Diet:       Do you drink/ eat things with caffeine? Yes      Marital status:                               What year were you married ?       Do you live in a house, apartment,assistred living, condo, trailer, etc.)? House      Is it one or more stories? 1      How many persons live in your home ? 6      Do you have any pets in your home ?(please list) Yes, 1      Current or past profession:       Do you exercise?   No                           Type & how often:       Do you have a living will? No      Do you have a DNR form?                       If not, do you want to discuss one?  Do you have signed POA?HPOA forms?                 If so, please bring to your        appointment         Social Determinants of Health   Financial Resource Strain: Medium Risk (10/01/2022)   Overall Financial Resource Strain (CARDIA)    Difficulty of Paying Living Expenses: Somewhat hard  Food Insecurity: Food Insecurity Present (10/01/2022)   Hunger Vital Sign    Worried About Running Out of Food in the Last Year: Sometimes true    Ran Out of Food in the Last Year: Sometimes true  Transportation Needs: No Transportation Needs (10/01/2022)   PRAPARE - Administrator, Civil Service (Medical): No    Lack of Transportation (Non-Medical): No  Physical Activity: Unknown (10/01/2022)   Exercise Vital Sign    Days of Exercise per Week: 0 days    Minutes of Exercise per Session: Not on file  Stress: No Stress Concern Present (10/01/2022)   [employer] of Occupational Health - Occupational Stress Questionnaire    Feeling of Stress : Only a little  Social Connections: Moderately Integrated (10/01/2022)   Social Connection and Isolation Panel [NHANES]    Frequency of Communication with Friends and Family: More than three times a week    Frequency of Social Gatherings with Friends and Family: Once a week    Attends Religious Services: 1 to 4 times per  year    Active Member of [employer] or Organizations: No    Attends Engineer, structural: Not on file    Marital Status: Living with partner  Intimate Partner Violence: Not on file    Family History  Problem Relation Age of Onset   Hypertension Mother 47   Anxiety disorder Mother    Hyperlipidemia Father 29   Stroke Maternal Grandmother    Anxiety disorder Sister     No past surgical history on file.  ROS: Review of Systems Negative except as stated above  PHYSICAL EXAM: There were no vitals taken for this visit.  Physical Exam  {female adult master:310786} {female adult master:310785}     Latest Ref Rng & Units 08/31/2021    3:42 PM 02/14/2020    9:59 AM 05/05/2018    8:19 AM  CMP  Glucose 70 - 99 mg/dL 74  80  95   BUN 6 - 20 mg/dL 11  12  7    Creatinine 0.57 - 1.00 mg/dL 4.09  8.11  9.14   Sodium 134 - 144 mmol/L 139  140  139   Potassium 3.5 - 5.2 mmol/L 4.3  4.0  4.1   Chloride 96 - 106 mmol/L 105  105  109   CO2 20 - 29 mmol/L  23  23   Calcium 8.7 - 10.2 mg/dL 9.5  9.4  9.4   Total Protein 6.0 - 8.5 g/dL 7.7  7.4  7.2   Total Bilirubin 0.0 - 1.2 mg/dL 0.3  0.3  0.9   Alkaline Phos 44 - 121 IU/L 42  45  38   AST 0 - 40 IU/L 19  14  23    ALT 0 - 32 IU/L  15  17    Lipid Panel     Component Value Date/Time   CHOL 151 02/14/2020 0959   TRIG 37 02/14/2020 0959   HDL 53 02/14/2020 0959   CHOLHDL 2.8 02/14/2020 0959   LDLCALC 89 02/14/2020 0959  CBC    Component Value Date/Time   WBC 9.5 08/31/2021 1542   WBC 4.6 05/05/2018 0819   RBC 4.66 08/31/2021 1542   RBC 4.56 05/05/2018 0819   HGB 13.8 08/31/2021 1542   HCT 41.5 08/31/2021 1542   PLT 339 08/31/2021 1542   MCV 89 08/31/2021 1542   MCH 29.6 08/31/2021 1542   MCH 28.1 05/05/2018 0819   MCHC 33.3 08/31/2021 1542   MCHC 32.5 05/05/2018 0819   RDW 12.7 08/31/2021 1542   LYMPHSABS 3.6 (H) 08/31/2021 1542   MONOABS 0.4 10/03/2012 1721   EOSABS 0.3 08/31/2021 1542   BASOSABS 0.1 08/31/2021  1542    ASSESSMENT AND PLAN:  There are no diagnoses linked to this encounter.   Patient was given the opportunity to ask questions.  Patient verbalized understanding of the plan and was able to repeat key elements of the plan. Patient was given clear instructions to go to Emergency Department or return to medical center if symptoms don't improve, worsen, or new problems develop.The patient verbalized understanding.   No orders of the defined types were placed in this encounter.    Requested Prescriptions    No prescriptions requested or ordered in this encounter    No follow-ups on file.  Rema Fendt, NP

## 2022-12-01 ENCOUNTER — Encounter: Payer: Self-pay | Admitting: Family

## 2022-12-01 ENCOUNTER — Ambulatory Visit (INDEPENDENT_AMBULATORY_CARE_PROVIDER_SITE_OTHER): Payer: BC Managed Care – PPO | Admitting: Family

## 2022-12-01 VITALS — BP 104/71 | HR 74 | Temp 98.6°F | Resp 18 | Ht 66.0 in | Wt 234.6 lb

## 2022-12-01 DIAGNOSIS — M542 Cervicalgia: Secondary | ICD-10-CM

## 2022-12-01 MED ORDER — AMOXICILLIN-POT CLAVULANATE 875-125 MG PO TABS
1.0000 | ORAL_TABLET | Freq: Two times a day (BID) | ORAL | 0 refills | Status: AC
Start: 2022-12-01 — End: 2022-12-08

## 2022-12-01 MED ORDER — PREDNISONE 10 MG PO TABS
ORAL_TABLET | ORAL | 0 refills | Status: AC
Start: 2022-12-01 — End: 2022-12-07

## 2022-12-01 NOTE — Progress Notes (Signed)
Pt is here for f/u   Here to f/u on her sore throat, pain has improved but states her throat still looks red and swollen

## 2022-12-07 ENCOUNTER — Ambulatory Visit: Payer: BC Managed Care – PPO | Admitting: Obstetrics and Gynecology

## 2022-12-07 ENCOUNTER — Other Ambulatory Visit: Payer: BC Managed Care – PPO

## 2022-12-09 ENCOUNTER — Ambulatory Visit
Admission: RE | Admit: 2022-12-09 | Discharge: 2022-12-09 | Disposition: A | Discharge status: Home / Self Care | Payer: BC Managed Care – PPO | Source: Ambulatory Visit | Attending: Family | Admitting: Family

## 2022-12-09 DIAGNOSIS — M542 Cervicalgia: Secondary | ICD-10-CM

## 2022-12-09 DIAGNOSIS — R29898 Other symptoms and signs involving the musculoskeletal system: Secondary | ICD-10-CM | POA: Diagnosis not present

## 2022-12-09 NOTE — Progress Notes (Deleted)
GYNECOLOGY  VISIT   HPI: 27 y.o.   Single  African American  female   G1P0010 with Patient's last menstrual period was 09/16/2022.   here for   4 mo recheck  GYNECOLOGIC HISTORY: Patient's last menstrual period was 09/16/2022. Contraception:  OCP Menopausal hormone therapy:  n/a Last mammogram:  n/a Last pap smear:   8/5/1 neg        OB History     Gravida  1   Para      Term      Preterm      AB  1   Living         SAB      IAB  1   Ectopic      Multiple      Live Births                 Patient Active Problem List   Diagnosis Date Noted   Recurrent bacterial infection 12/10/2021   Dysmenorrhea 04/16/2020   Marijuana user 12/07/2018   Depression 01/14/2013   Hydradenitis 02/29/2012   Impaired hearing 02/29/2012   Obesity (BMI 35.0-39.9 without comorbidity) 02/29/2012    Past Medical History:  Diagnosis Date   Chronic back pain    Depression    [employer]reesia 02/11/2020   Obesity     No past surgical history on file.  Current Outpatient Medications  Medication Sig Dispense Refill   Adalimumab (HUMIRA-CD/UC/HS STARTER) 80 MG/0.8ML PNKT Humira(CF) Pen 80 mg/0.8 mL subcutaneous kit     cyclobenzaprine (FLEXERIL) 5 MG tablet Take 1 tablet (5 mg total) by mouth 3 (three) times daily as needed for muscle spasms. 30 tablet 0   Levonorgestrel-Ethinyl Estradiol (SIMPESSE) 0.15-0.03 &0.01 MG tablet Take 1 tablet by mouth daily.     phentermine 15 MG capsule Take 1 capsule (15 mg total) by mouth every morning. 30 capsule 0   sertraline (ZOLOFT) 50 MG tablet Take 1 tablet (50 mg total) by mouth daily. 90 tablet 1   No current facility-administered medications for this visit.     ALLERGIES: Patient has no known allergies.  Family History  Problem Relation Age of Onset   Hypertension Mother 79   Anxiety disorder Mother    Hyperlipidemia Father 21   Stroke Maternal Grandmother    Anxiety disorder Sister     Social History   Socioeconomic History    Marital status: Single    Spouse name: Not on file   Number of children: Not on file   Years of education: Not on file   Highest education level: 12th grade  Occupational History   Not on file  Tobacco Use   Smoking status: Never   Smokeless tobacco: Never  Vaping Use   Vaping Use: Never used  Substance and Sexual Activity   Alcohol use: Yes    Comment: Occassional alcohol use   Drug use: Yes    Types: Marijuana   Sexual activity: Yes    Partners: Male    Birth control/protection: Pill  Other Topics Concern   Not on file  Social History Narrative   Lives with mother, sister, niece, nephew, and sister's boyfriend.       Diet:       Do you drink/ eat things with caffeine? Yes      Marital status:  What year were you married ?       Do you live in a house, apartment,assistred living, condo, trailer, etc.)? House      Is it one or more stories? 1      How many persons live in your home ? 6      Do you have any pets in your home ?(please list) Yes, 1      Current or past profession:       Do you exercise?   No                           Type & how often:       Do you have a living will? No      Do you have a DNR form?                       If not, do you want to discuss one?       Do you have signed POA?HPOA forms?                 If so, please bring to your        appointment         Social Determinants of Health   Financial Resource Strain: Medium Risk (10/01/2022)   Overall Financial Resource Strain (CARDIA)    Difficulty of Paying Living Expenses: Somewhat hard  Food Insecurity: Food Insecurity Present (10/01/2022)   Hunger Vital Sign    Worried About Running Out of Food in the Last Year: Sometimes true    Ran Out of Food in the Last Year: Sometimes true  Transportation Needs: No Transportation Needs (10/01/2022)   PRAPARE - Administrator, Civil Service (Medical): No    Lack of Transportation (Non-Medical): No   Physical Activity: Unknown (10/01/2022)   Exercise Vital Sign    Days of Exercise per Week: 0 days    Minutes of Exercise per Session: Not on file  Stress: No Stress Concern Present (10/01/2022)   [employer] of Occupational Health - Occupational Stress Questionnaire    Feeling of Stress : Only a little  Social Connections: Moderately Integrated (10/01/2022)   Social Connection and Isolation Panel [NHANES]    Frequency of Communication with Friends and Family: More than three times a week    Frequency of Social Gatherings with Friends and Family: Once a week    Attends Religious Services: 1 to 4 times per year    Active Member of [employer] or Organizations: No    Attends Engineer, structural: Not on file    Marital Status: Living with partner  Intimate Partner Violence: Not on file    Review of Systems  PHYSICAL EXAMINATION:    LMP 09/16/2022     General appearance: alert, cooperative and appears stated age Head: Normocephalic, without obvious abnormality, atraumatic Neck: no adenopathy, supple, symmetrical, trachea midline and thyroid normal to inspection and palpation Lungs: clear to auscultation bilaterally Breasts: normal appearance, no masses or tenderness, No nipple retraction or dimpling, No nipple discharge or bleeding, No axillary or supraclavicular adenopathy Heart: regular rate and rhythm Abdomen: soft, non-tender, no masses,  no organomegaly Extremities: extremities normal, atraumatic, no cyanosis or edema Skin: Skin color, texture, turgor normal. No rashes or lesions Lymph nodes: Cervical, supraclavicular, and axillary nodes normal. No abnormal inguinal nodes palpated Neurologic: Grossly normal  Pelvic: External genitalia:  no lesions  Urethra:  normal appearing urethra with no masses, tenderness or lesions              Bartholins and Skenes: normal                 Vagina: normal appearing vagina with normal color and discharge, no lesions               Cervix: no lesions                Bimanual Exam:  Uterus:  normal size, contour, position, consistency, mobility, non-tender              Adnexa: no mass, fullness, tenderness              Rectal exam: {yes no:314532}.  Confirms.              Anus:  normal sphincter tone, no lesions  Chaperone was present for exam:  ***  ASSESSMENT     PLAN     An After Visit Summary was printed and given to the patient.  ______ minutes face to face time of which over 50% was spent in counseling.

## 2022-12-13 ENCOUNTER — Ambulatory Visit: Payer: BC Managed Care – PPO | Admitting: Obstetrics and Gynecology

## 2022-12-13 DIAGNOSIS — L732 Hidradenitis suppurativa: Secondary | ICD-10-CM | POA: Diagnosis not present

## 2022-12-13 DIAGNOSIS — L02411 Cutaneous abscess of right axilla: Secondary | ICD-10-CM | POA: Diagnosis not present

## 2022-12-13 DIAGNOSIS — Z79899 Other long term (current) drug therapy: Secondary | ICD-10-CM | POA: Diagnosis not present

## 2022-12-22 NOTE — Progress Notes (Deleted)
GYNECOLOGY  VISIT   HPI: 27 y.o.   Single  African American  female   G1P0010 with No LMP recorded.   here for   4 mo recheck  GYNECOLOGIC HISTORY: No LMP recorded. Contraception:  OCP Menopausal hormone therapy:  n/a Last mammogram:  n/a Last pap smear:   02/14/20 neg        OB History     Gravida  1   Para      Term      Preterm      AB  1   Living         SAB      IAB  1   Ectopic      Multiple      Live Births                 Patient Active Problem List   Diagnosis Date Noted   Recurrent bacterial infection 12/10/2021   Dysmenorrhea 04/16/2020   Marijuana user 12/07/2018   Depression 01/14/2013   Hydradenitis 02/29/2012   Impaired hearing 02/29/2012   Obesity (BMI 35.0-39.9 without comorbidity) 02/29/2012    Past Medical History:  Diagnosis Date   Chronic back pain    Depression    Phreesia 02/11/2020   Obesity     No past surgical history on file.  Current Outpatient Medications  Medication Sig Dispense Refill   Adalimumab (HUMIRA-CD/UC/HS STARTER) 80 MG/0.8ML PNKT Humira(CF) Pen 80 mg/0.8 mL subcutaneous kit     cyclobenzaprine (FLEXERIL) 5 MG tablet Take 1 tablet (5 mg total) by mouth 3 (three) times daily as needed for muscle spasms. 30 tablet 0   Levonorgestrel-Ethinyl Estradiol (SIMPESSE) 0.15-0.03 &0.01 MG tablet Take 1 tablet by mouth daily.     phentermine 15 MG capsule Take 1 capsule (15 mg total) by mouth every morning. 30 capsule 0   sertraline (ZOLOFT) 50 MG tablet Take 1 tablet (50 mg total) by mouth daily. 90 tablet 1   No current facility-administered medications for this visit.     ALLERGIES: Patient has no known allergies.  Family History  Problem Relation Age of Onset   Hypertension Mother 10   Anxiety disorder Mother    Hyperlipidemia Father 92   Stroke Maternal Grandmother    Anxiety disorder Sister     Social History   Socioeconomic History   Marital status: Single    Spouse name: Not on file   Number  of children: Not on file   Years of education: Not on file   Highest education level: 12th grade  Occupational History   Not on file  Tobacco Use   Smoking status: Never   Smokeless tobacco: Never  Vaping Use   Vaping Use: Never used  Substance and Sexual Activity   Alcohol use: Yes    Comment: Occassional alcohol use   Drug use: Yes    Types: Marijuana   Sexual activity: Yes    Partners: Male    Birth control/protection: Pill  Other Topics Concern   Not on file  Social History Narrative   Lives with mother, sister, niece, nephew, and sister's boyfriend.       Diet:       Do you drink/ eat things with caffeine? Yes      Marital status:                               What year were you married ?  Do you live in a house, apartment,assistred living, condo, trailer, etc.)? House      Is it one or more stories? 1      How many persons live in your home ? 6      Do you have any pets in your home ?(please list) Yes, 1      Current or past profession:       Do you exercise?   No                           Type & how often:       Do you have a living will? No      Do you have a DNR form?                       If not, do you want to discuss one?       Do you have signed POA?HPOA forms?                 If so, please bring to your        appointment         Social Determinants of Health   Financial Resource Strain: Medium Risk (10/01/2022)   Overall Financial Resource Strain (CARDIA)    Difficulty of Paying Living Expenses: Somewhat hard  Food Insecurity: Food Insecurity Present (10/01/2022)   Hunger Vital Sign    Worried About Running Out of Food in the Last Year: Sometimes true    Ran Out of Food in the Last Year: Sometimes true  Transportation Needs: No Transportation Needs (10/01/2022)   PRAPARE - Administrator, Civil Service (Medical): No    Lack of Transportation (Non-Medical): No  Physical Activity: Unknown (10/01/2022)   Exercise Vital Sign     Days of Exercise per Week: 0 days    Minutes of Exercise per Session: Not on file  Stress: No Stress Concern Present (10/01/2022)   [employer] of Occupational Health - Occupational Stress Questionnaire    Feeling of Stress : Only a little  Social Connections: Moderately Integrated (10/01/2022)   Social Connection and Isolation Panel [NHANES]    Frequency of Communication with Friends and Family: More than three times a week    Frequency of Social Gatherings with Friends and Family: Once a week    Attends Religious Services: 1 to 4 times per year    Active Member of [employer] or Organizations: No    Attends Engineer, structural: Not on file    Marital Status: Living with partner  Intimate Partner Violence: Not on file    Review of Systems  PHYSICAL EXAMINATION:    There were no vitals taken for this visit.    General appearance: alert, cooperative and appears stated age Head: Normocephalic, without obvious abnormality, atraumatic Neck: no adenopathy, supple, symmetrical, trachea midline and thyroid normal to inspection and palpation Lungs: clear to auscultation bilaterally Breasts: normal appearance, no masses or tenderness, No nipple retraction or dimpling, No nipple discharge or bleeding, No axillary or supraclavicular adenopathy Heart: regular rate and rhythm Abdomen: soft, non-tender, no masses,  no organomegaly Extremities: extremities normal, atraumatic, no cyanosis or edema Skin: Skin color, texture, turgor normal. No rashes or lesions Lymph nodes: Cervical, supraclavicular, and axillary nodes normal. No abnormal inguinal nodes palpated Neurologic: Grossly normal  Pelvic: External genitalia:  no lesions  Urethra:  normal appearing urethra with no masses, tenderness or lesions              Bartholins and Skenes: normal                 Vagina: normal appearing vagina with normal color and discharge, no lesions              Cervix: no lesions                 Bimanual Exam:  Uterus:  normal size, contour, position, consistency, mobility, non-tender              Adnexa: no mass, fullness, tenderness              Rectal exam: {yes no:[id number]}.  Confirms.              Anus:  normal sphincter tone, no lesions  Chaperone was present for exam:  ***  ASSESSMENT     PLAN     An After Visit Summary was printed and given to the patient.  ______ minutes face to face time of which over 50% was spent in counseling.

## 2022-12-23 ENCOUNTER — Ambulatory Visit: Payer: Self-pay | Admitting: Surgery

## 2022-12-23 DIAGNOSIS — L732 Hidradenitis suppurativa: Secondary | ICD-10-CM | POA: Diagnosis not present

## 2022-12-23 NOTE — H&P (Signed)
History of Present Illness: Kelsey Carroll is a 27 y.o. female who was referred to me for evaluation of hidradenitis. She has had symptoms for years affecting the axillae and groins. She follows with dermatology and is currently on doxycycline and Humira.  She has been on Humira for about 2 years, and says it has overall improved her disease. The only area she is having more severe persistent symptoms is the right axilla. She had a recent flare and wound that has mostly healed.     Review of Systems: A complete review of systems was obtained from the patient.  I have reviewed this information and discussed as appropriate with the patient.  See HPI as well for other ROS.       Medical History: Past Medical History Past Medical History: Diagnosis Date  Anxiety    Diabetes mellitus without complication (CMS/HHS-HCC)         Problem List There is no problem list on file for this patient.     Past Surgical History History reviewed. No pertinent surgical history.     Allergies No Known Allergies    Medications Ordered Prior to Encounter Current Outpatient Medications on File Prior to Visit Medication Sig Dispense Refill  HUMIRA,CF, PEN 80 mg/0.8 mL PnKt        L norgest/e.estradioL-e.estrad (SIMPESSE) 0.15 mg-30 mcg (84)/10 mcg (7) TAKE 1 TABLET BY MOUTH EVERY DAY **NEEDS ANNUAL EXAM FOR FURTHER REFILLS**      phentermine (ADIPEX-P) 15 MG capsule Take 15 mg by mouth every morning      sertraline (ZOLOFT) 50 MG tablet Take 50 mg by mouth once daily        No current facility-administered medications on file prior to visit.      Family History History reviewed. No pertinent family history.     Tobacco Use History Social History    Tobacco Use Smoking Status Former  Types: Cigarettes Smokeless Tobacco Never      Social History Social History    Socioeconomic History  Marital status: Single Tobacco Use  Smoking status: Former     Types: Cigarettes  Smokeless  tobacco: Never Vaping Use  Vaping status: Every Day Substance and Sexual Activity  Alcohol use: Yes  Drug use: Yes     Types: Marijuana    Social Determinants of Health    Financial Resource Strain: Medium Risk (10/01/2022)   Received from [facility]   Overall Financial Resource Strain (CARDIA)    Difficulty of Paying Living Expenses: Somewhat hard Food Insecurity: Food Insecurity Present (10/01/2022)   Received from [facility]   Hunger Vital Sign    Worried About Running Out of Food in the Last Year: Sometimes true    Ran Out of Food in the Last Year: Sometimes true Transportation Needs: No Transportation Needs (10/01/2022)   Received from [facility] - [facility]    Lack of Transportation (Medical): No    Lack of Transportation (Non-Medical): No Physical Activity: Unknown (10/01/2022)   Received from [facility]   Exercise Vital Sign    Days of Exercise per Week: 0 days Stress: No Stress Concern Present (10/01/2022)   Received from [facility] of Occupational Health - Occupational Stress Questionnaire    Feeling of Stress : Only a little Social Connections: Moderately Integrated (10/01/2022)   Received from [facility]   Social Connection and Isolation Panel [NHANES]    Frequency of Communication with Friends and Family: More than three times  a week    Frequency of Social Gatherings with Friends and Family: Once a week    Attends Religious Services: 1 to 4 times per year    Active Member of Clubs or Organizations: No    Marital Status: Living with partner      Objective:     Vitals:   12/23/22 1458 BP: 105/71 Pulse: 80 Temp: 36.4 C (97.5 F) SpO2: 97% Weight: (!) 105.6 kg (232 lb 12.8 oz) Height: 167.6 cm (5\' 6" ) PainSc: 0-No pain   Body mass index is 37.57 kg/m.   Physical Exam Vitals reviewed.  Constitutional:      General: She is not in acute distress.    Appearance: Normal appearance.  HENT:     Head:  Normocephalic and atraumatic.  Pulmonary:     Effort: Pulmonary effort is normal. No respiratory distress.  Skin:    Comments: Right axilla: Nodules and scars extending on the right upper inner arm. No open wounds or purulent drainage. Left axilla: Multiple healed scars and nodules, no open wounds.  Neurological:     General: No focal deficit present.     Mental Status: She is alert and oriented to person, place, and time.          Assessment and Plan: Diagnoses and all orders for this visit:   Hidradenitis axillaris -     CCS Case Posting Request; Future       This is a 27 yo female with hidradenitis. She has been on medical management including Humira for several years, but has persistent symptoms in the right axilla. I offered surgical excision of this area. I reviewed the details of surgery, including the possibility of an open wound, the risk of wound dehiscence and the anticipated postoperative restrictions. She expressed understanding and agrees to proceed with surgery. She will be contacted to schedule an elective surgery date. She should continue taking Humira and oral antibiotics as regularly scheduled.  Sophronia Simas, MD [facility] Surgery General, Hepatobiliary and Pancreatic Surgery 12/23/22 4:50 PM

## 2022-12-29 ENCOUNTER — Ambulatory Visit (INDEPENDENT_AMBULATORY_CARE_PROVIDER_SITE_OTHER): Payer: BC Managed Care – PPO | Admitting: Internal Medicine

## 2022-12-29 ENCOUNTER — Encounter (INDEPENDENT_AMBULATORY_CARE_PROVIDER_SITE_OTHER): Payer: Self-pay | Admitting: Internal Medicine

## 2022-12-29 VITALS — BP 98/62 | HR 64 | Temp 98.1°F | Ht 66.0 in | Wt 227.0 lb

## 2022-12-29 DIAGNOSIS — D84821 Immunodeficiency due to drugs: Secondary | ICD-10-CM | POA: Diagnosis not present

## 2022-12-29 DIAGNOSIS — E669 Obesity, unspecified: Secondary | ICD-10-CM

## 2022-12-29 DIAGNOSIS — L732 Hidradenitis suppurativa: Secondary | ICD-10-CM

## 2022-12-29 DIAGNOSIS — Z6836 Body mass index (BMI) 36.0-36.9, adult: Secondary | ICD-10-CM

## 2022-12-29 DIAGNOSIS — Z79899 Other long term (current) drug therapy: Secondary | ICD-10-CM

## 2022-12-29 NOTE — Assessment & Plan Note (Signed)
Associated with a high body weight.  She is currently on Humira.  Losing 10 to 15% of body weight may improve condition.  Patient also counseled on immunocompromise state and the importance of staying up-to-date with vaccinations and avoiding infections through sanitation practices.

## 2022-12-29 NOTE — Assessment & Plan Note (Signed)
She is currently on Humira.  Losing 10 to 15% of body weight may improve condition.  Patient also counseled on immunocompromise state and the importance of staying up-to-date with vaccinations and avoiding infections through sanitation practices.

## 2022-12-29 NOTE — Progress Notes (Signed)
Office: [phone]  /  Fax: [phone]   Initial Visit  Kelsey Carroll was seen in clinic today to evaluate for obesity. She is interested in losing weight to improve overall health and reduce the risk of weight related complications. She presents today to review program treatment options, initial physical assessment, and evaluation.     She was referred by: PCP  Occupation: Pensions consultant, no children, single.  When asked what else they would like to accomplish? She states: Improve energy levels and physical activity, Improve existing medical conditions, Improve quality of life, and Improve self-confidence  Weight history: Was considered obese during childhood.  When asked how has your weight affected you? She states: Has affected self-esteem, Contributed to medical problems, Having poor endurance, and Problems with eating patterns  Some associated conditions: None  Contributing factors: Family history and Nutritional  Weight promoting medications identified: Psychotropic medications and Contraceptives or hormonal therapy  Current nutrition plan: None  Current level of physical activity: None  Current or previous pharmacotherapy: Phentermine  Response to medication:  on it, has not lost weight. Prescribed by PCP   Past medical history includes:   Past Medical History:  Diagnosis Date   Chronic back pain    Depression    Phreesia 02/11/2020   Obesity      Objective:   BP 98/62   Pulse 64   Temp 98.1 F (36.7 C)   Ht 5\' 6"  (1.676 m)   Wt 227 lb (103 kg)   LMP 10/01/2022   SpO2 98%   BMI 36.64 kg/m  She was weighed on the bioimpedance scale: Body mass index is 36.64 kg/m.  Peak Weight:255 , Body Fat%:42, Visceral Fat Rating:9, Weight trend over the last 12 months: Unchanged  General:  Alert, oriented and cooperative. Patient is in no acute distress.  Respiratory: Normal respiratory effort, no problems with respiration noted   Gait: able to  ambulate independently  Mental Status: Normal mood and affect. Normal behavior. Normal judgment and thought content.   DIAGNOSTIC DATA REVIEWED:  BMET    Component Value Date/Time   NA 139 08/31/2021 1542   K 4.3 08/31/2021 1542   CL 105 08/31/2021 1542   CO2 23 02/14/2020 0959   GLUCOSE 74 08/31/2021 1542   GLUCOSE 95 05/05/2018 0819   BUN 11 08/31/2021 1542   CREATININE 0.95 08/31/2021 1542   CALCIUM 9.5 08/31/2021 1542   GFRNONAA 90 02/14/2020 0959   GFRAA 103 02/14/2020 0959   No results found for: "HGBA1C" No results found for: "INSULIN" CBC    Component Value Date/Time   WBC 9.5 08/31/2021 1542   WBC 4.6 05/05/2018 0819   RBC 4.66 08/31/2021 1542   RBC 4.56 05/05/2018 0819   HGB 13.8 08/31/2021 1542   HCT 41.5 08/31/2021 1542   PLT 339 08/31/2021 1542   MCV 89 08/31/2021 1542   MCH 29.6 08/31/2021 1542   MCH 28.1 05/05/2018 0819   MCHC 33.3 08/31/2021 1542   MCHC 32.5 05/05/2018 0819   RDW 12.7 08/31/2021 1542   Iron/TIBC/Ferritin/ %Sat No results found for: "IRON", "TIBC", "FERRITIN", "IRONPCTSAT" Lipid Panel     Component Value Date/Time   CHOL 151 02/14/2020 0959   TRIG 37 02/14/2020 0959   HDL 53 02/14/2020 0959   CHOLHDL 2.8 02/14/2020 0959   LDLCALC 89 02/14/2020 0959   Hepatic Function Panel     Component Value Date/Time   PROT 7.7 08/31/2021 1542   ALBUMIN 4.6 08/31/2021 1542   AST 19  08/31/2021 1542   ALT 15 02/14/2020 0959   ALKPHOS 42 (L) 08/31/2021 1542   BILITOT 0.3 08/31/2021 1542      Component Value Date/Time   TSH 1.030 08/31/2021 1542     Assessment and Plan:   Class 2 severe obesity with serious comorbidity and body mass index (BMI) of 36.0 to 36.9 in adult, unspecified obesity type [facility]) Assessment & Plan: Considering age of onset, family history and current BMI I suspect she may have a genetic contributor to her weight problem.  She is also on some medications that may be weight promoting.  I reviewed most recent labs  there are no serological signs of metabolic derangements.  We reviewed weight, biometrics, associated medical conditions and contributing factors with patient. She would benefit from weight loss therapy via a modified calorie, low-carb, high-protein nutritional plan tailored to their REE (resting energy expenditure) which will be determined by indirect calorimetry.  We will also assess for cardiometabolic risk and nutritional derangements via fasting serologies at her next appointment.   Immunosuppression due to drug therapy [facility]) Assessment & Plan: She is currently on Humira.  Losing 10 to 15% of body weight may improve condition.  Patient also counseled on immunocompromise state and the importance of staying up-to-date with vaccinations and avoiding infections through sanitation practices.   Hydradenitis Assessment & Plan: Associated with a high body weight.  She is currently on Humira.  Losing 10 to 15% of body weight may improve condition.  Patient also counseled on immunocompromise state and the importance of staying up-to-date with vaccinations and avoiding infections through sanitation practices.         Obesity Treatment / Action Plan:  Patient will work on garnering support from family and friends to begin weight loss journey. Will work on eliminating or reducing the presence of highly palatable, calorie dense foods in the home. Will complete provided nutritional and psychosocial assessment questionnaire before the next appointment. Will be scheduled for indirect calorimetry to determine resting energy expenditure in a fasting state.  This will allow [place] to create a reduced calorie, high-protein meal plan to promote loss of fat mass while preserving muscle mass. Counseled on the health benefits of losing 5%-15% of total body weight. Was counseled on nutritional approaches to weight loss and benefits of reducing processed foods and consuming plant-based foods and high quality protein  as part of nutritional weight management. Was counseled on pharmacotherapy and role as an adjunct in weight management.   Obesity Education Performed Today:  She was weighed on the bioimpedance scale and results were discussed and documented in the synopsis.  We discussed obesity as a disease and the importance of a more detailed evaluation of all the factors contributing to the disease.  We discussed the importance of long term lifestyle changes which include nutrition, exercise and behavioral modifications as well as the importance of customizing this to her specific health and social needs.  We discussed the benefits of reaching a healthier weight to alleviate the symptoms of existing conditions and reduce the risks of the biomechanical, metabolic and psychological effects of obesity.  Delcie Roch appears to be in the action stage of change and states they are ready to start intensive lifestyle modifications and behavioral modifications.  30 minutes was spent today on this visit including the above counseling, pre-visit chart review, and post-visit documentation.  Reviewed by clinician on day of visit: allergies, medications, problem list, medical history, surgical history, family history, social history, and previous encounter  notes pertinent to obesity diagnosis.   Worthy Rancher, MD

## 2022-12-29 NOTE — Assessment & Plan Note (Addendum)
Considering age of onset, family history and current BMI I suspect she may have a genetic contributor to her weight problem.  She is also on some medications that may be weight promoting.  I reviewed most recent labs there are no serological signs of metabolic derangements.  We reviewed weight, biometrics, associated medical conditions and contributing factors with patient. She would benefit from weight loss therapy via a modified calorie, low-carb, high-protein nutritional plan tailored to their REE (resting energy expenditure) which will be determined by indirect calorimetry.  We will also assess for cardiometabolic risk and nutritional derangements via fasting serologies at her next appointment.

## 2023-01-05 ENCOUNTER — Ambulatory Visit: Payer: BC Managed Care – PPO | Admitting: Obstetrics and Gynecology

## 2023-01-06 ENCOUNTER — Ambulatory Visit: Payer: BC Managed Care – PPO | Admitting: Family Medicine

## 2023-02-26 ENCOUNTER — Ambulatory Visit (HOSPITAL_COMMUNITY)
Admission: RE | Admit: 2023-02-26 | Discharge: 2023-02-26 | Disposition: A | Discharge status: Home / Self Care | Payer: Self-pay | Source: Ambulatory Visit | Attending: Emergency Medicine | Admitting: Emergency Medicine

## 2023-02-26 ENCOUNTER — Encounter (HOSPITAL_COMMUNITY): Payer: Self-pay

## 2023-02-26 VITALS — BP 113/78 | HR 99 | Temp 98.6°F | Resp 16

## 2023-02-26 DIAGNOSIS — R21 Rash and other nonspecific skin eruption: Secondary | ICD-10-CM

## 2023-02-26 HISTORY — DX: Hidradenitis suppurativa: L73.2

## 2023-02-26 MED ORDER — DIPHENHYDRAMINE HCL 25 MG PO TABS: 25.0000 mg | ORAL_TABLET | Freq: Four times a day (QID) | ORAL | 0 refills | Status: AC | PRN

## 2023-02-26 MED ORDER — DEXAMETHASONE SODIUM PHOSPHATE 10 MG/ML IJ SOLN
INTRAMUSCULAR | Status: AC
  Filled 2023-02-26: qty 1

## 2023-02-26 MED ORDER — DEXAMETHASONE SODIUM PHOSPHATE 10 MG/ML IJ SOLN
10.0000 mg | Freq: Once | INTRAMUSCULAR | Status: AC
  Administered 2023-02-26: 10 mg via INTRAMUSCULAR

## 2023-02-26 MED ORDER — PREDNISONE 20 MG PO TABS: 40.0000 mg | ORAL_TABLET | Freq: Every day | ORAL | 0 refills | Status: AC

## 2023-02-26 NOTE — Discharge Instructions (Addendum)
It is unclear what is caused your rash.  We have given you a steroid injection in clinic and you can start the oral steroids tomorrow with breakfast.  For itching you can take the Benadryl every 6 hours, this may cause drowsiness.  Do not drink or drive on this medication.  Seek immediate care if you do not experience improvement with these interventions, develop oral swelling, trouble swallowing, shortness of breath, wheezing, or any new concerning symptoms.

## 2023-02-26 NOTE — ED Provider Notes (Signed)
[facility]    CSN: [id number] Arrival date & time: 02/26/23  1615      History   Chief Complaint Chief Complaint  Patient presents with   Rash    HPI Kelsey Carroll is a 27 y.o. female.   Patient presents to clinic over concerns of itching and a rash that has been spreading since last night.  She initially noticed to small bumps to her lower back/upper gluteal area on her right side that were itchy.  They are vesicular now.  She now has erythema and itching of her palms with erythematous rash extending to her mid forearms bilaterally.  Rash is now spread to her abdomen that is macular, urticarial and erythematous.  She denies chest pain, shortness of breath or any known allergies.   Denies new lotion, hand sanitizers or any changes.   Denies fevers. No known tick bites.     The history is provided by the patient and medical records.  Rash Associated symptoms: no fever     Past Medical History:  Diagnosis Date   Chronic back pain    Depression    Phreesia 02/11/2020   Hidradenitis suppurativa    Obesity     Patient Active Problem List   Diagnosis Date Noted   Immunosuppression due to drug therapy (HCC) 12/29/2022   Class 2 severe obesity with serious comorbidity and body mass index (BMI) of 36.0 to 36.9 in adult [facility]) 12/29/2022   Recurrent bacterial infection 12/10/2021   Dysmenorrhea 04/16/2020   Marijuana user 12/07/2018   Depression 01/14/2013   Hydradenitis 02/29/2012   Impaired hearing 02/29/2012   Obesity (BMI 35.0-39.9 without comorbidity) 02/29/2012    History reviewed. No pertinent surgical history.  OB History     Gravida  1   Para      Term      Preterm      AB  1   Living         SAB      IAB  1   Ectopic      Multiple      Live Births               Home Medications    Prior to Admission medications   Medication Sig Start Date End Date Taking? Authorizing Provider  Adalimumab (HUMIRA-CD/UC/HS  STARTER) 80 MG/0.8ML PNKT Humira(CF) Pen 80 mg/0.8 mL subcutaneous kit   Yes [provider]  diphenhydrAMINE (BENADRYL) 25 MG tablet Take 1 tablet (25 mg total) by mouth every 6 (six) hours as needed. 02/26/23  Yes Rinaldo Ratel, [place] N, FNP  Levonorgestrel-Ethinyl Estradiol (SIMPESSE) 0.15-0.03 &0.01 MG tablet Take 1 tablet by mouth daily. 04/16/20  Yes [provider]  predniSONE (DELTASONE) 20 MG tablet Take 2 tablets (40 mg total) by mouth daily with breakfast for 5 days. 02/26/23 03/03/23 Yes Rinaldo Ratel, [place] N, FNP  sertraline (ZOLOFT) 50 MG tablet Take 1 tablet (50 mg total) by mouth daily. 08/16/22  Yes Georganna Skeans, MD  cyclobenzaprine (FLEXERIL) 5 MG tablet Take 1 tablet (5 mg total) by mouth 3 (three) times daily as needed for muscle spasms. 09/08/22   Patton Salles, MD  phentermine 15 MG capsule Take 1 capsule (15 mg total) by mouth every morning. 10/25/22   Georganna Skeans, MD    Family History Family History  Problem Relation Age of Onset   Hypertension Mother 21   Anxiety disorder Mother    Hyperlipidemia Father 23   Stroke Maternal  Grandmother    Anxiety disorder Sister     Social History Social History   Tobacco Use   Smoking status: Never   Smokeless tobacco: Never  Vaping Use   Vaping status: Never Used  Substance Use Topics   Alcohol use: Yes    Comment: Occassional alcohol use   Drug use: Yes    Types: Marijuana     Allergies   Patient has no known allergies.   Review of Systems Review of Systems  Constitutional:  Negative for fever.  Skin:  Positive for rash.     Physical Exam Triage Vital Signs ED Triage Vitals [02/26/23 1629]  Encounter Vitals Group     BP 113/78     Systolic BP Percentile      Diastolic BP Percentile      Pulse Rate 99     Resp 16     Temp 98.6 F (37 C)     Temp Source Oral     SpO2 97 %     Weight      Height      Head Circumference      Peak Flow      Pain Score      Pain Loc      Pain  Education      Exclude from Growth Chart    No data found.  Updated Vital Signs BP 113/78 (BP Location: Right Arm)   Pulse 99   Temp 98.6 F (37 C) (Oral)   Resp 16   SpO2 97%   Visual Acuity Right Eye Distance:   Left Eye Distance:   Bilateral Distance:    Right Eye Near:   Left Eye Near:    Bilateral Near:     Physical Exam Vitals and nursing note reviewed.  Constitutional:      Appearance: Normal appearance.  HENT:     Head: Normocephalic and atraumatic.     Right Ear: External ear normal.     Left Ear: External ear normal.     Nose: Nose normal.     Mouth/Throat:     Mouth: Mucous membranes are moist.  Eyes:     Conjunctiva/sclera: Conjunctivae normal.  Cardiovascular:     Rate and Rhythm: Normal rate.  Pulmonary:     Effort: Pulmonary effort is normal. No respiratory distress.  Musculoskeletal:        General: Normal range of motion.     Cervical back: Normal range of motion.  Skin:    General: Skin is warm and dry.     Findings: Rash present.  Neurological:     General: No focal deficit present.     Mental Status: She is alert and oriented to person, place, and time.  Psychiatric:        Mood and Affect: Mood normal.        Behavior: Behavior normal.      UC Treatments / Results  Labs (all labs ordered are listed, but only abnormal results are displayed) Labs Reviewed - No data to display  EKG   Radiology No results found.  Procedures Procedures (including critical care time)  Medications Ordered in UC Medications  dexamethasone (DECADRON) injection 10 mg (has no administration in time range)    Initial Impression / Assessment and Plan / UC Course  I have reviewed the triage vital signs and the nursing notes.  Pertinent labs & imaging results that were available during my care of the patient were reviewed by me and considered  in my medical decision making (see chart for details).  Vitals and triage reviewed, patient is  hemodynamically stable.  Urticarial macular rash present to anterior abdomen, bilateral hands and forearm. No s/s of anaphylaxis or oral involvement. IM and PO steroids and antihistamines advised.  [place], no known tick bites.  Rash does not appear consistent with [facility] spotted fever, patient is also been afebrile.  Advised avoidance of allergens, scented soaps and detergents.  Plan of care, follow-up care and return precautions given, no questions at this time.     Final Clinical Impressions(s) / UC Diagnoses   Final diagnoses:  Rash and nonspecific skin eruption     Discharge Instructions      It is unclear what is caused your rash.  We have given you a steroid injection in clinic and you can start the oral steroids tomorrow with breakfast.  For itching you can take the Benadryl every 6 hours, this may cause drowsiness.  Do not drink or drive on this medication.  Seek immediate care if you do not experience improvement with these interventions, develop oral swelling, trouble swallowing, shortness of breath, wheezing, or any new concerning symptoms.      ED Prescriptions     Medication Sig Dispense Auth. Provider   diphenhydrAMINE (BENADRYL) 25 MG tablet Take 1 tablet (25 mg total) by mouth every 6 (six) hours as needed. 30 tablet Rinaldo Ratel, [place] N, [place]   predniSONE (DELTASONE) 20 MG tablet Take 2 tablets (40 mg total) by mouth daily with breakfast for 5 days. 10 tablet Ivyonna Hoelzel, [place] N, FNP      PDMP not reviewed this encounter.   Aleenah Homen, [place] N, [place] 02/26/23 [phone]

## 2023-02-26 NOTE — ED Triage Notes (Addendum)
Yesterday noticed two small bumps to lower back that itched. Then developed a rash on both hands (anterior and posterior) into wrists that was itching to the point of pain. Most recently has spread onto abdomen throughout today. Has not tried taking any medications, no history of similar reaction in past. States two days ago Kelsey [place], but hasn't been to any wooded areas, no known tick bite. Is on humira for HS

## 2023-06-27 ENCOUNTER — Telehealth: Payer: Self-pay | Admitting: Family Medicine

## 2023-06-27 NOTE — Telephone Encounter (Signed)
Medication Refill -  Most Recent Primary Care Visit:  Provider: Worthy Rancher  Department: [facility]  Visit Type: INFORMATION SESSION  Date: 12/29/2022  Medication: Levonorgestrel-Ethinyl Estradiol (SIMPESSE) 0.15-0.03 &0.01 MG tablet   Has the patient contacted their pharmacy? Yes  Is this the correct pharmacy for this prescription? Yes If no, delete pharmacy and type the correct one.  This is the patient's preferred pharmacy:   [phone] - [place], [place] - [address] AT [facility] [address] [place] [address] Phone: [phone] Fax: [phone]  Has the prescription been filled recently? No  Is the patient out of the medication? Yes  Has the patient been seen for an appointment in the last year OR does the patient have an upcoming appointment? Yes  Can we respond through MyChart? Yes  Agent: Please be advised that Rx refills may take up to 3 business days. We ask that you follow-up with your pharmacy.

## 2023-06-28 ENCOUNTER — Other Ambulatory Visit: Payer: Self-pay | Admitting: Family Medicine

## 2023-06-28 MED ORDER — LEVONORGEST-ETH ESTRAD 91-DAY 0.15-0.03 &0.01 MG PO TABS: 1.0000 | ORAL_TABLET | Freq: Every day | ORAL | 0 refills | Status: AC

## 2023-07-15 ENCOUNTER — Ambulatory Visit (INDEPENDENT_AMBULATORY_CARE_PROVIDER_SITE_OTHER): Payer: Self-pay | Admitting: Family

## 2023-07-15 VITALS — BP 107/73 | HR 76 | Temp 98.3°F | Ht 66.0 in | Wt 243.6 lb

## 2023-07-15 DIAGNOSIS — Z5941 Food insecurity: Secondary | ICD-10-CM

## 2023-07-15 DIAGNOSIS — Z3041 Encounter for surveillance of contraceptive pills: Secondary | ICD-10-CM

## 2023-07-15 DIAGNOSIS — Z5986 Financial insecurity: Secondary | ICD-10-CM

## 2023-07-15 NOTE — Progress Notes (Signed)
 Patient ID: Kelsey Carroll, female    DOB: 06/20/1996  MRN: [id number]  CC: Birth Control   Subjective: Kelsey Carroll is a 28 y.o. female who presents for birth control.   Her concerns today include:  - Needs refills on Simpesse. States she received call from pharmacy that Simpesse is already available for pickup and plans to pickup later today. LMP: 07/14/2023. States in the future she is considering beginning a new birth control medication due to thinks present birth control is causing facial hair. She plans to schedule follow-up appointment at later date to discuss birth control options.   Patient Active Problem List   Diagnosis Date Noted   Immunosuppression due to drug therapy (HCC) 12/29/2022   Class 2 severe obesity with serious comorbidity and body mass index (BMI) of 36.0 to 36.9 in adult [facility]) 12/29/2022   Recurrent bacterial infection 12/10/2021   Dysmenorrhea 04/16/2020   Marijuana user 12/07/2018   Depression 01/14/2013   Hydradenitis 02/29/2012   Impaired hearing 02/29/2012   Obesity (BMI 35.0-39.9 without comorbidity) 02/29/2012     Current Outpatient Medications on File Prior to Visit  Medication Sig Dispense Refill   Adalimumab (HUMIRA-CD/UC/HS STARTER) 80 MG/0.8ML PNKT Humira(CF) Pen 80 mg/0.8 mL subcutaneous kit     cyclobenzaprine  (FLEXERIL ) 5 MG tablet Take 1 tablet (5 mg total) by mouth 3 (three) times daily as needed for muscle spasms. 30 tablet 0   Levonorgestrel-Ethinyl Estradiol  (SIMPESSE) 0.15-0.03 &0.01 MG tablet Take 1 tablet by mouth daily. 91 tablet 0   phentermine  15 MG capsule Take 1 capsule (15 mg total) by mouth every morning. 30 capsule 0   sertraline  (ZOLOFT ) 50 MG tablet Take 1 tablet (50 mg total) by mouth daily. 90 tablet 1   diphenhydrAMINE  (BENADRYL ) 25 MG tablet Take 1 tablet (25 mg total) by mouth every 6 (six) hours as needed. (Patient not taking: Reported on 07/15/2023) 30 tablet 0   No current facility-administered medications on file  prior to visit.    No Known Allergies  Social History   Socioeconomic History   Marital status: Single    Spouse name: Not on file   Number of children: Not on file   Years of education: Not on file   Highest education level: Bachelor's degree (e.g., BA, AB, BS)  Occupational History   Not on file  Tobacco Use   Smoking status: Never   Smokeless tobacco: Never  Vaping Use   Vaping status: Never Used  Substance and Sexual Activity   Alcohol use: Yes    Comment: Occassional alcohol use   Drug use: Yes    Types: Marijuana   Sexual activity: Yes    Partners: Male    Birth control/protection: Pill  Other Topics Concern   Not on file  Social History Narrative   Lives with mother, sister, niece, nephew, and sister's boyfriend.       Diet:       Do you drink/ eat things with caffeine? Yes      Marital status:                               What year were you married ?       Do you live in a house, apartment,assistred living, condo, trailer, etc.)? House      Is it one or more stories? 1      How many persons live in your home ? 6  Do you have any pets in your home ?(please list) Yes, 1      Current or past profession:       Do you exercise?   No                           Type & how often:       Do you have a living will? No      Do you have a DNR form?                       If not, do you want to discuss one?       Do you have signed POA?HPOA forms?                 If so, please bring to your        appointment         Social Drivers of Health   Financial Resource Strain: High Risk (07/15/2023)   Overall Financial Resource Strain (CARDIA)    Difficulty of Paying Living Expenses: Hard  Food Insecurity: Food Insecurity Present (07/15/2023)   Hunger Vital Sign    Worried About Running Out of Food in the Last Year: Sometimes true    Ran Out of Food in the Last Year: Sometimes true  Transportation Needs: No Transportation Needs (07/15/2023)   PRAPARE -  Administrator, Civil Service (Medical): No    Lack of Transportation (Non-Medical): No  Physical Activity: Unknown (07/15/2023)   Exercise Vital Sign    Days of Exercise per Week: 0 days    Minutes of Exercise per Session: Not on file  Stress: No Stress Concern Present (07/15/2023)   [employer] of Occupational Health - Occupational Stress Questionnaire    Feeling of Stress : Only a little  Social Connections: Moderately Isolated (07/15/2023)   Social Connection and Isolation Panel [NHANES]    Frequency of Communication with Friends and Family: More than three times a week    Frequency of Social Gatherings with Friends and Family: Once a week    Attends Religious Services: 1 to 4 times per year    Active Member of [employer] or Organizations: No    Attends Engineer, Structural: Not on file    Marital Status: Never married  Catering Manager Violence: Not on file    Family History  Problem Relation Age of Onset   Hypertension Mother 76   Anxiety disorder Mother    Hyperlipidemia Father 35   Stroke Maternal Grandmother    Anxiety disorder Sister     No past surgical history on file.  ROS: Review of Systems Negative except as stated above  PHYSICAL EXAM: BP 107/73   Pulse 76   Temp 98.3 F (36.8 C) (Oral)   Ht 5' 6 (1.676 m)   Wt 243 lb 9.6 oz (110.5 kg)   LMP 07/14/2023 (Exact Date)   SpO2 96%   BMI 39.32 kg/m   Physical Exam HENT:     Head: Normocephalic and atraumatic.     Nose: Nose normal.     Mouth/Throat:     Mouth: Mucous membranes are moist.     Pharynx: Oropharynx is clear.  Eyes:     Extraocular Movements: Extraocular movements intact.     Conjunctiva/sclera: Conjunctivae normal.     Pupils: Pupils are equal, round, and reactive to light.  Cardiovascular:     Rate  and Rhythm: Normal rate and regular rhythm.     Pulses: Normal pulses.     Heart sounds: Normal heart sounds.  Pulmonary:     Effort: Pulmonary effort is normal.      Breath sounds: Normal breath sounds.  Musculoskeletal:        General: Normal range of motion.     Cervical back: Normal range of motion and neck supple.  Neurological:     General: No focal deficit present.     Mental Status: She is alert and oriented to person, place, and time.  Psychiatric:        Mood and Affect: Mood normal.        Behavior: Behavior normal.    ASSESSMENT AND PLAN: 1. Encounter for surveillance of contraceptive pills (Primary) - Continue Levonorgestrel-Ethinyl Estradiol  (SIMPESSE) as prescribed. Last prescribed 06/28/2023. Counseled on medication adherence/adverse effects.  - Follow-up with primary provider as scheduled.     Patient was given the opportunity to ask questions.  Patient verbalized understanding of the plan and was able to repeat key elements of the plan. Patient was given clear instructions to go to Emergency Department or return to medical center if symptoms don't improve, worsen, or new problems develop.The patient verbalized understanding.   Return for Follow-Up or next available with Raguel Blush, MD .  Greig JINNY Drones, NP

## 2023-07-15 NOTE — Progress Notes (Signed)
 Patient wants to talk about changing birth control after this prescription.

## 2023-10-06 ENCOUNTER — Ambulatory Visit: Payer: Medicaid Other | Admitting: Family Medicine

## 2023-10-06 ENCOUNTER — Other Ambulatory Visit (HOSPITAL_COMMUNITY)
Admission: RE | Admit: 2023-10-06 | Discharge: 2023-10-06 | Disposition: A | Discharge status: Home / Self Care | Source: Ambulatory Visit | Attending: Family Medicine | Admitting: Family Medicine

## 2023-10-06 VITALS — BP 118/73 | HR 79 | Temp 98.3°F | Resp 16 | Ht 66.0 in | Wt 245.0 lb

## 2023-10-06 DIAGNOSIS — A64 Unspecified sexually transmitted disease: Secondary | ICD-10-CM | POA: Insufficient documentation

## 2023-10-06 DIAGNOSIS — Z3041 Encounter for surveillance of contraceptive pills: Secondary | ICD-10-CM

## 2023-10-06 DIAGNOSIS — N941 Unspecified dyspareunia: Secondary | ICD-10-CM | POA: Diagnosis not present

## 2023-10-06 DIAGNOSIS — Z124 Encounter for screening for malignant neoplasm of cervix: Secondary | ICD-10-CM

## 2023-10-06 MED ORDER — LOESTRIN FE 1/20 1-20 MG-MCG PO TABS: 1.0000 | ORAL_TABLET | Freq: Every day | ORAL | 0 refills | Status: AC

## 2023-10-06 NOTE — Progress Notes (Unsigned)
 Patient concern about uncomfortable  sex intercoarse. Patient said this has been going on for a few months. Patient request pap and talk about birth control

## 2023-10-07 ENCOUNTER — Encounter: Payer: Self-pay | Admitting: Family Medicine

## 2023-10-07 LAB — CERVICOVAGINAL ANCILLARY ONLY
Bacterial Vaginitis (gardnerella): POSITIVE — AB
Candida Glabrata: NEGATIVE
Candida Vaginitis: NEGATIVE
Chlamydia: NEGATIVE
Comment: NEGATIVE
Comment: NEGATIVE
Comment: NEGATIVE
Comment: NEGATIVE
Comment: NEGATIVE
Comment: NORMAL
Neisseria Gonorrhea: NEGATIVE
Trichomonas: NEGATIVE

## 2023-10-07 MED ORDER — METRONIDAZOLE 500 MG PO TABS: 500.0000 mg | ORAL_TABLET | Freq: Two times a day (BID) | ORAL | 0 refills | Status: AC

## 2023-10-07 NOTE — Progress Notes (Signed)
 Established Patient Office Visit  Subjective    Patient ID: Kelsey Carroll, female    DOB: 24-May-1996  Age: 28 y.o. MRN: [id number]  CC:  Chief Complaint  Patient presents with   Medical Management of Chronic Issues    HPI ZANDREA KENEALY presents for pap and pelvic exam. She reports that she has been having some dyspareunia. She also would like to change OCP as she feels that she is growing more body hair with the present one.   Outpatient Encounter Medications as of 10/06/2023  Medication Sig   Levonorgestrel-Ethinyl Estradiol (SIMPESSE) 0.15-0.03 &0.01 MG tablet Take 1 tablet by mouth daily.   norethindrone-ethinyl estradiol-FE (LOESTRIN FE 1/20) 1-20 MG-MCG tablet Take 1 tablet by mouth daily.   Adalimumab (HUMIRA-CD/UC/HS STARTER) 80 MG/0.8ML PNKT Humira(CF) Pen 80 mg/0.8 mL subcutaneous kit (Patient not taking: Reported on 10/06/2023)   cyclobenzaprine (FLEXERIL) 5 MG tablet Take 1 tablet (5 mg total) by mouth 3 (three) times daily as needed for muscle spasms. (Patient not taking: Reported on 10/06/2023)   diphenhydrAMINE (BENADRYL) 25 MG tablet Take 1 tablet (25 mg total) by mouth every 6 (six) hours as needed. (Patient not taking: Reported on 10/06/2023)   sertraline (ZOLOFT) 50 MG tablet Take 1 tablet (50 mg total) by mouth daily. (Patient not taking: Reported on 10/06/2023)   [DISCONTINUED] phentermine 15 MG capsule Take 1 capsule (15 mg total) by mouth every morning. (Patient not taking: Reported on 10/06/2023)   No facility-administered encounter medications on file as of 10/06/2023.    Past Medical History:  Diagnosis Date   Chronic back pain    Depression    Phreesia 02/11/2020   Hidradenitis suppurativa    Obesity     History reviewed. No pertinent surgical history.  Family History  Problem Relation Age of Onset   Hypertension Mother 59   Anxiety disorder Mother    Hyperlipidemia Father 35   Stroke Maternal Grandmother    Anxiety disorder Sister     Social  History   Socioeconomic History   Marital status: Single    Spouse name: Not on file   Number of children: Not on file   Years of education: Not on file   Highest education level: Bachelor's degree (e.g., BA, AB, BS)  Occupational History   Not on file  Tobacco Use   Smoking status: Never   Smokeless tobacco: Never  Vaping Use   Vaping status: Never Used  Substance and Sexual Activity   Alcohol use: Yes    Comment: Occassional alcohol use   Drug use: Yes    Types: Marijuana   Sexual activity: Yes    Partners: Male    Birth control/protection: Pill  Other Topics Concern   Not on file  Social History Narrative   Lives with mother, sister, niece, nephew, and sister's boyfriend.       Diet:       Do you drink/ eat things with caffeine? Yes      Marital status:                               What year were you married ?       Do you live in a house, apartment,assistred living, condo, trailer, etc.)? House      Is it one or more stories? 1      How many persons live in your home ? 6  Do you have any pets in your home ?(please list) Yes, 1      Current or past profession:       Do you exercise?   No                           Type & how often:       Do you have a living will? No      Do you have a DNR form?                       If not, do you want to discuss one?       Do you have signed POA?HPOA forms?                 If so, please bring to your        appointment         Social Drivers of Health   Financial Resource Strain: High Risk (07/15/2023)   Overall Financial Resource Strain (CARDIA)    Difficulty of Paying Living Expenses: Hard  Food Insecurity: Food Insecurity Present (07/15/2023)   Hunger Vital Sign    Worried About Running Out of Food in the Last Year: Sometimes true    Ran Out of Food in the Last Year: Sometimes true  Transportation Needs: No Transportation Needs (07/15/2023)   PRAPARE - Administrator, Civil Service (Medical): No     Lack of Transportation (Non-Medical): No  Physical Activity: Unknown (07/15/2023)   Exercise Vital Sign    Days of Exercise per Week: 0 days    Minutes of Exercise per Session: Not on file  Stress: No Stress Concern Present (07/15/2023)   [employer] of Occupational Health - Occupational Stress Questionnaire    Feeling of Stress : Only a little  Social Connections: Moderately Isolated (07/15/2023)   Social Connection and Isolation Panel [NHANES]    Frequency of Communication with Friends and Family: More than three times a week    Frequency of Social Gatherings with Friends and Family: Once a week    Attends Religious Services: 1 to 4 times per year    Active Member of [employer] or Organizations: No    Attends Engineer, structural: Not on file    Marital Status: Never married  Intimate Partner Violence: Not on file    Review of Systems  All other systems reviewed and are negative.       Objective    BP 118/73   Pulse 79   Temp 98.3 F (36.8 C) (Oral)   Resp 16   Ht 5\' 6"  (1.676 m)   Wt 245 lb (111.1 kg)   SpO2 98%   BMI 39.54 kg/m   Physical Exam Vitals and nursing note reviewed.  Constitutional:      General: She is not in acute distress. Abdominal:     Palpations: Abdomen is soft.     Tenderness: There is no abdominal tenderness.  Genitourinary:    Exam position: Supine.     Pubic Area: No rash.      Labia:        Right: No tenderness.        Left: No tenderness.      Vagina: Normal.     Cervix: Normal.     Uterus: Normal.      Adnexa: Right adnexa normal and left adnexa normal.     Rectum:  Normal.  Lymphadenopathy:     Lower Body: No right inguinal adenopathy. No left inguinal adenopathy.  Neurological:     General: No focal deficit present.     Mental Status: She is alert and oriented to person, place, and time.         Assessment & Plan:   Screening for cervical cancer -     Cytology - PAP  STI (sexually transmitted infection) -      Cervicovaginal ancillary only  Dyspareunia in female  Encounter for surveillance of contraceptive pills  Other orders -     Loestrin Fe 1/20; Take 1 tablet by mouth daily.  Dispense: 84 tablet; Refill: 0     No follow-ups on file.   Tommie Raymond, MD

## 2023-10-07 NOTE — Progress Notes (Signed)
Patient read message in mychart  °

## 2023-10-11 LAB — CYTOLOGY - PAP

## 2023-12-09 ENCOUNTER — Ambulatory Visit: Payer: Self-pay | Admitting: Family Medicine

## 2023-12-09 DIAGNOSIS — R87612 Low grade squamous intraepithelial lesion on cytologic smear of cervix (LGSIL): Secondary | ICD-10-CM

## 2023-12-16 ENCOUNTER — Other Ambulatory Visit: Payer: Self-pay | Admitting: Physician Assistant

## 2023-12-16 DIAGNOSIS — R7989 Other specified abnormal findings of blood chemistry: Secondary | ICD-10-CM

## 2023-12-21 ENCOUNTER — Ambulatory Visit
Admission: RE | Admit: 2023-12-21 | Discharge: 2023-12-21 | Disposition: A | Discharge status: Home / Self Care | Source: Ambulatory Visit | Attending: Physician Assistant | Admitting: Physician Assistant

## 2023-12-21 DIAGNOSIS — R7989 Other specified abnormal findings of blood chemistry: Secondary | ICD-10-CM

## 2024-02-18 ENCOUNTER — Emergency Department (HOSPITAL_BASED_OUTPATIENT_CLINIC_OR_DEPARTMENT_OTHER)
Admission: EM | Admit: 2024-02-18 | Discharge: 2024-02-18 | Disposition: A | Discharge status: Home / Self Care | Attending: Emergency Medicine | Admitting: Emergency Medicine

## 2024-02-18 ENCOUNTER — Encounter (HOSPITAL_BASED_OUTPATIENT_CLINIC_OR_DEPARTMENT_OTHER): Payer: Self-pay

## 2024-02-18 ENCOUNTER — Other Ambulatory Visit: Payer: Self-pay

## 2024-02-18 DIAGNOSIS — R112 Nausea with vomiting, unspecified: Secondary | ICD-10-CM | POA: Diagnosis present

## 2024-02-18 DIAGNOSIS — R197 Diarrhea, unspecified: Secondary | ICD-10-CM | POA: Diagnosis not present

## 2024-02-18 LAB — URINALYSIS, ROUTINE W REFLEX MICROSCOPIC
Bilirubin Urine: NEGATIVE
Glucose, UA: NEGATIVE mg/dL
Ketones, ur: 40 mg/dL — AB
Leukocytes,Ua: NEGATIVE
Nitrite: NEGATIVE
Protein, ur: 30 mg/dL — AB
Specific Gravity, Urine: 1.028 (ref 1.005–1.030)
pH: 6 (ref 5.0–8.0)

## 2024-02-18 LAB — COMPREHENSIVE METABOLIC PANEL WITH GFR
ALT: 12 U/L (ref 0–44)
AST: 18 U/L (ref 15–41)
Albumin: 4.7 g/dL (ref 3.5–5.0)
Alkaline Phosphatase: 45 U/L (ref 38–126)
Anion gap: 17 — ABNORMAL HIGH (ref 5–15)
BUN: 9 mg/dL (ref 6–20)
CO2: 21 mmol/L — ABNORMAL LOW (ref 22–32)
Calcium: 10 mg/dL (ref 8.9–10.3)
Chloride: 102 mmol/L (ref 98–111)
Creatinine, Ser: 1.16 mg/dL — ABNORMAL HIGH (ref 0.44–1.00)
GFR, Estimated: >60 mL/min (ref >60)
Glucose, Bld: 98 mg/dL (ref 70–99)
Potassium: 3.7 mmol/L (ref 3.5–5.1)
Sodium: 140 mmol/L (ref 135–145)
Total Bilirubin: 0.5 mg/dL (ref 0.0–1.2)
Total Protein: 8.9 g/dL — ABNORMAL HIGH (ref 6.5–8.1)

## 2024-02-18 LAB — CBC
HCT: 40.1 % (ref 36.0–46.0)
Hemoglobin: 13.8 g/dL (ref 12.0–15.0)
MCH: 28.9 pg (ref 26.0–34.0)
MCHC: 34.4 g/dL (ref 30.0–36.0)
MCV: 83.9 fL (ref 80.0–100.0)
Platelets: 411 K/uL — ABNORMAL HIGH (ref 150–400)
RBC: 4.78 MIL/uL (ref 3.87–5.11)
RDW: 13.6 % (ref 11.5–15.5)
WBC: 11.2 K/uL — ABNORMAL HIGH (ref 4.0–10.5)
nRBC: 0 % (ref 0.0–0.2)

## 2024-02-18 LAB — LIPASE, BLOOD: Lipase: 16 U/L (ref 11–51)

## 2024-02-18 LAB — PREGNANCY, URINE: Preg Test, Ur: NEGATIVE

## 2024-02-18 MED ORDER — ONDANSETRON 4 MG PO TBDP: 4.0000 mg | ORAL_TABLET | Freq: Three times a day (TID) | ORAL | 0 refills | Status: AC | PRN

## 2024-02-18 MED ORDER — SODIUM CHLORIDE 0.9 % IV BOLUS
1000.0000 mL | Freq: Once | INTRAVENOUS | Status: AC
  Administered 2024-02-18: 1000 mL via INTRAVENOUS

## 2024-02-18 MED ORDER — ONDANSETRON HCL 4 MG/2ML IJ SOLN
4.0000 mg | Freq: Once | INTRAMUSCULAR | Status: AC
  Administered 2024-02-18: 4 mg via INTRAVENOUS
  Filled 2024-02-18: qty 2

## 2024-02-18 MED ORDER — METOCLOPRAMIDE HCL 5 MG/ML IJ SOLN
5.0000 mg | Freq: Once | INTRAMUSCULAR | Status: AC
  Administered 2024-02-18: 5 mg via INTRAVENOUS
  Filled 2024-02-18: qty 2

## 2024-02-18 MED ORDER — ALUM & MAG HYDROXIDE-SIMETH 200-200-20 MG/5ML PO SUSP
30.0000 mL | Freq: Once | ORAL | Status: AC
  Administered 2024-02-18: 30 mL via ORAL
  Filled 2024-02-18: qty 30

## 2024-02-18 NOTE — ED Triage Notes (Signed)
 Arrives POV with complaints of nausea/vomiting and diarrhea since yesterday when she ate a restaurant. No abdominal pain on arrival.

## 2024-02-18 NOTE — ED Provider Notes (Signed)
 [place] EMERGENCY DEPARTMENT AT [facility] Provider Note   CSN: [id number] Arrival date & time: 02/18/24  1519     Patient presents with: Emesis and Nausea   Kelsey Carroll is a 28 y.o. female.   28 year old female presents with complaint of nausea and vomiting with diarrhea onset yesterday after eating a salad from a restaurant.  Denies blood in emesis or stools, abdominal pain or fevers.  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  No other complaints or concerns today.       Prior to Admission medications   Medication Sig Start Date End Date Taking? Authorizing Provider  ondansetron  (ZOFRAN -ODT) 4 MG disintegrating tablet Take 1 tablet (4 mg total) by mouth every 8 (eight) hours as needed for nausea or vomiting. 02/18/24  Yes Beverley Leita LABOR, PA-C  Adalimumab (HUMIRA-CD/UC/HS STARTER) 80 MG/0.8ML PNKT Humira(CF) Pen 80 mg/0.8 mL subcutaneous kit Patient not taking: Reported on 10/06/2023    [provider]  cyclobenzaprine  (FLEXERIL ) 5 MG tablet Take 1 tablet (5 mg total) by mouth 3 (three) times daily as needed for muscle spasms. Patient not taking: Reported on 10/06/2023 09/08/22   Amundson C Silva, Brook E, MD  diphenhydrAMINE  (BENADRYL ) 25 MG tablet Take 1 tablet (25 mg total) by mouth every 6 (six) hours as needed. Patient not taking: Reported on 10/06/2023 02/26/23   Dreama, [place]  N, FNP  Levonorgestrel-Ethinyl Estradiol  (SIMPESSE) 0.15-0.03 &0.01 MG tablet Take 1 tablet by mouth daily. 06/28/23   Tanda Bleacher, MD  norethindrone -ethinyl estradiol -FE (LOESTRIN  FE 1/20) 1-20 MG-MCG tablet Take 1 tablet by mouth daily. 10/06/23   Tanda Bleacher, MD  sertraline  (ZOLOFT ) 50 MG tablet Take 1 tablet (50 mg total) by mouth daily. Patient not taking: Reported on 10/06/2023 08/16/22   Tanda Bleacher, MD    Allergies: Patient has no known allergies.    Review of Systems Negative except as per HPI Updated Vital Signs BP 119/72 (BP Location: Right  Arm)   Pulse 84   Temp 98.9 F (37.2 C) (Oral)   Resp 20   Ht 5' 6 (1.676 m)   Wt 102.5 kg   SpO2 100%   BMI 36.48 kg/m   Physical Exam Vitals and nursing note reviewed.  Constitutional:      General: She is not in acute distress.    Appearance: She is well-developed. She is not diaphoretic.  HENT:     Head: Normocephalic and atraumatic.     Mouth/Throat:     Mouth: Mucous membranes are dry.  Cardiovascular:     Rate and Rhythm: Normal rate and regular rhythm.     Heart sounds: Normal heart sounds.  Pulmonary:     Effort: Pulmonary effort is normal.     Breath sounds: Normal breath sounds.  Abdominal:     Palpations: Abdomen is soft.     Tenderness: There is no abdominal tenderness. There is no guarding or rebound.  Skin:    General: Skin is warm and dry.     Findings: No erythema or rash.  Neurological:     Mental Status: She is alert and oriented to person, place, and time.  Psychiatric:        Behavior: Behavior normal.     (all labs ordered are listed, but only abnormal results are displayed) Labs Reviewed  COMPREHENSIVE METABOLIC PANEL WITH GFR - Abnormal; Notable for the following components:      Result Value   CO2 21 (*)    Creatinine, Ser 1.16 (*)  Total Protein 8.9 (*)    Anion gap 17 (*)    All other components within normal limits  CBC - Abnormal; Notable for the following components:   WBC 11.2 (*)    Platelets 411 (*)    All other components within normal limits  URINALYSIS, ROUTINE W REFLEX MICROSCOPIC - Abnormal; Notable for the following components:   APPearance HAZY (*)    Hgb urine dipstick MODERATE (*)    Ketones, ur 40 (*)    Protein, ur 30 (*)    Bacteria, UA RARE (*)    All other components within normal limits  LIPASE, BLOOD  PREGNANCY, URINE    EKG: None  Radiology: No results found.   Procedures   Medications Ordered in the ED  ondansetron  (ZOFRAN ) injection 4 mg (4 mg Intravenous Given 02/18/24 1617)  sodium  chloride 0.9 % bolus 1,000 mL (0 mLs Intravenous Stopped 02/18/24 1746)  alum & mag hydroxide-simeth (MAALOX/MYLANTA) 200-200-20 MG/5ML suspension 30 mL (30 mLs Oral Given 02/18/24 1659)  metoCLOPramide  (REGLAN ) injection 5 mg (5 mg Intravenous Given 02/18/24 1744)                                    Medical Decision Making Amount and/or Complexity of Data Reviewed Labs: ordered.  Risk OTC drugs. Prescription drug management.   This patient presents to the ED for concern of nausea, vomiting, diarrhea, this involves an extensive number of treatment options, and is a complaint that carries with it a high risk of complications and morbidity.  The differential diagnosis includes but not limited to gastroenteritis, viral illness, colitis   Co morbidities / Chronic conditions that complicate the patient evaluation  Hidradenitis, obesity, chronic back pain and depression.  Medical history lists immunosuppression due to drug therapy however patient is no longer taking this medication.   Additional history obtained:  Additional history obtained from EMR External records from outside source obtained and reviewed including prior labs on file   Lab Tests:  I Ordered, and personally interpreted labs.  The pertinent results include: hCG negative.  Urinalysis suggesting dehydration with ketones, protein and hazy appearance.  Lipase normal.  CMP with mild metabolic acidosis with bicarb of 21 and gap of 17.  Glucose is normal.  Creatinine very minimally elevated 1.16.  CBC with mild leukocytosis at 11.2.   Problem List / ED Course / Critical interventions / Medication management  28 year old female presents with complaint of nausea, vomiting, diarrhea.  Symptoms started after eating salad at a restaurant.  She denies fevers or abdominal pain.  Her abdomen is soft and nontender.  She does have dry mucous membranes likely secondary to significant vomiting and diarrhea.  She is mildly dehydrated on labs.   Provided with IV fluids and tolerating p.o. fluid at time of discharge.  Encouraged to follow with primary care as needed, return to ER for worsening or concerning symptoms.  Discharged with prescription for Zofran .  Recommend bland diet. I ordered medication including Zofran , Reglan , Maalox and IV fluids Reevaluation of the patient after these medicines showed that the patient symptoms improved, tolerating fluids. I have reviewed the patients home medicines and have made adjustments as needed   Social Determinants of Health:  No PCP on file.  Works with children.   Test / Admission - Considered:  Stable for discharge, tolerating oral fluids.      Final diagnoses:  Nausea vomiting and diarrhea  ED Discharge Orders          Ordered    ondansetron  (ZOFRAN -ODT) 4 MG disintegrating tablet  Every 8 hours PRN        02/18/24 1824               Beverley Leita LABOR, PA-C 02/18/24 1828    Mannie Pac T, DO 02/19/24 1538

## 2024-02-18 NOTE — Discharge Instructions (Signed)
 Home to rest and continue to hydrate.  Zofran  as needed as prescribed for nausea and vomiting.  Return to the emergency room for fevers, worsening or concerning symptoms.  Please see bland diet instructions for diarrhea.

## 2024-05-07 ENCOUNTER — Other Ambulatory Visit: Payer: Self-pay | Admitting: Physician Assistant

## 2024-05-07 DIAGNOSIS — R229 Localized swelling, mass and lump, unspecified: Secondary | ICD-10-CM

## 2024-05-10 ENCOUNTER — Ambulatory Visit
Admission: RE | Admit: 2024-05-10 | Discharge: 2024-05-10 | Disposition: A | Discharge status: Home / Self Care | Source: Ambulatory Visit | Attending: Physician Assistant | Admitting: Physician Assistant

## 2024-05-10 DIAGNOSIS — R229 Localized swelling, mass and lump, unspecified: Secondary | ICD-10-CM

## 2024-10-08 ENCOUNTER — Ambulatory Visit: Admitting: Dermatology
